# Patient Record
Sex: Male | Born: 1940 | Race: White | Hispanic: No | Marital: Married | Smoking: Former smoker
Health system: Southern US, Community
[De-identification: ages and names within clinical notes are randomized; demographics above are authoritative.]

## PROBLEM LIST (undated history)

## (undated) DIAGNOSIS — N400 Enlarged prostate without lower urinary tract symptoms: Secondary | ICD-10-CM

## (undated) DIAGNOSIS — R7303 Prediabetes: Secondary | ICD-10-CM

## (undated) DIAGNOSIS — I499 Cardiac arrhythmia, unspecified: Secondary | ICD-10-CM

## (undated) DIAGNOSIS — E785 Hyperlipidemia, unspecified: Secondary | ICD-10-CM

## (undated) DIAGNOSIS — C801 Malignant (primary) neoplasm, unspecified: Secondary | ICD-10-CM

## (undated) DIAGNOSIS — R0600 Dyspnea, unspecified: Secondary | ICD-10-CM

## (undated) DIAGNOSIS — I2510 Atherosclerotic heart disease of native coronary artery without angina pectoris: Secondary | ICD-10-CM

## (undated) DIAGNOSIS — M549 Dorsalgia, unspecified: Secondary | ICD-10-CM

## (undated) DIAGNOSIS — I739 Peripheral vascular disease, unspecified: Secondary | ICD-10-CM

## (undated) DIAGNOSIS — I495 Sick sinus syndrome: Secondary | ICD-10-CM

## (undated) DIAGNOSIS — I209 Angina pectoris, unspecified: Secondary | ICD-10-CM

## (undated) DIAGNOSIS — R600 Localized edema: Secondary | ICD-10-CM

## (undated) DIAGNOSIS — I10 Essential (primary) hypertension: Secondary | ICD-10-CM

## (undated) DIAGNOSIS — G8929 Other chronic pain: Secondary | ICD-10-CM

## (undated) DIAGNOSIS — J3089 Other allergic rhinitis: Secondary | ICD-10-CM

## (undated) HISTORY — PX: COLONOSCOPY WITH ESOPHAGOGASTRODUODENOSCOPY (EGD): SHX5779

## (undated) HISTORY — PX: OTHER SURGICAL HISTORY: SHX169

## (undated) HISTORY — PX: JOINT REPLACEMENT: SHX530

## (undated) HISTORY — PX: KNEE SURGERY: SHX244

## (undated) HISTORY — PX: COLONOSCOPY: SHX174

## (undated) HISTORY — PX: CORONARY ANGIOPLASTY WITH STENT PLACEMENT: SHX49

## (undated) HISTORY — PX: TONSILLECTOMY: SUR1361

---

## 2004-03-03 ENCOUNTER — Ambulatory Visit: Payer: Self-pay | Admitting: Gastroenterology

## 2004-05-31 ENCOUNTER — Other Ambulatory Visit: Payer: Self-pay

## 2004-06-29 ENCOUNTER — Inpatient Hospital Stay: Payer: Self-pay | Admitting: General Practice

## 2006-02-07 ENCOUNTER — Ambulatory Visit: Payer: Self-pay | Admitting: Gastroenterology

## 2007-07-25 ENCOUNTER — Ambulatory Visit: Payer: Self-pay

## 2007-08-21 ENCOUNTER — Ambulatory Visit: Payer: Self-pay | Admitting: Otolaryngology

## 2007-09-13 ENCOUNTER — Ambulatory Visit: Payer: Self-pay | Admitting: Otolaryngology

## 2007-12-28 ENCOUNTER — Ambulatory Visit: Payer: Self-pay | Admitting: Otolaryngology

## 2008-02-26 ENCOUNTER — Ambulatory Visit: Payer: Self-pay | Admitting: General Practice

## 2008-03-10 ENCOUNTER — Inpatient Hospital Stay: Payer: Self-pay | Admitting: General Practice

## 2009-01-09 ENCOUNTER — Ambulatory Visit: Payer: Self-pay | Admitting: Otolaryngology

## 2009-10-03 IMAGING — CT CT NECK WITH CONTRAST
1 of 3 series · 8 of 14 positions shown, 10 images · IV contrast (agent unspecified)
Comparison: none

REASON FOR EXAM: thyroid nodule
COMMENTS:

PROCEDURE:     JONAS - JONAS NECK WITH CONTRAST  - August 21, 2007  [DATE]
RESULT:
HISTORY: Thyroid nodule.

[Series 7: webspace · axial · 0.47mm/px · z∈[+157,+415]mm · 8 of 222 slices shown, 10 images]
[im 25/222  soft-tissue]
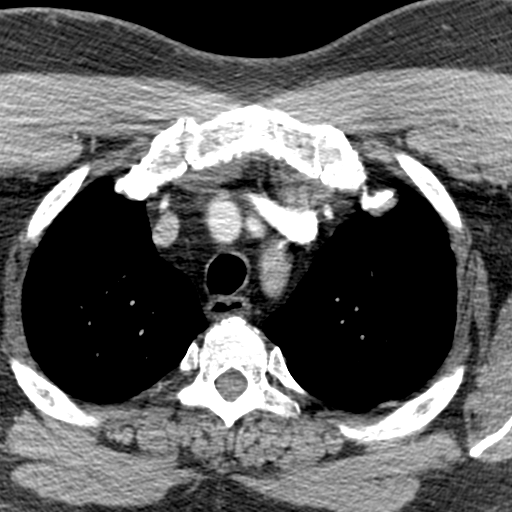
[im 25/222  bone]
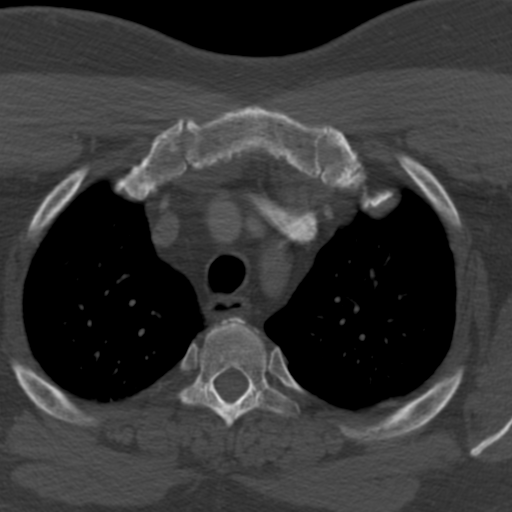
[im 50/222  bone]
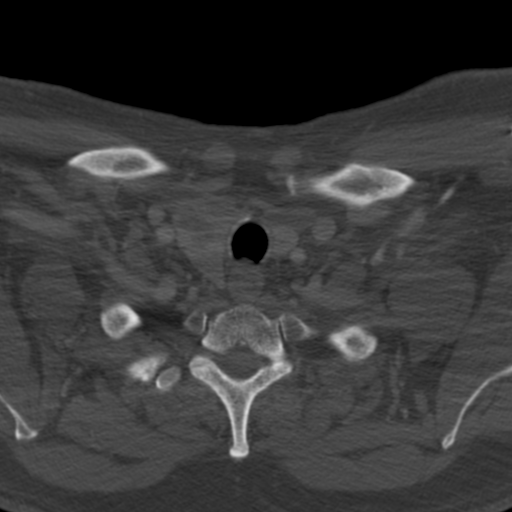
[im 74/222  bone]
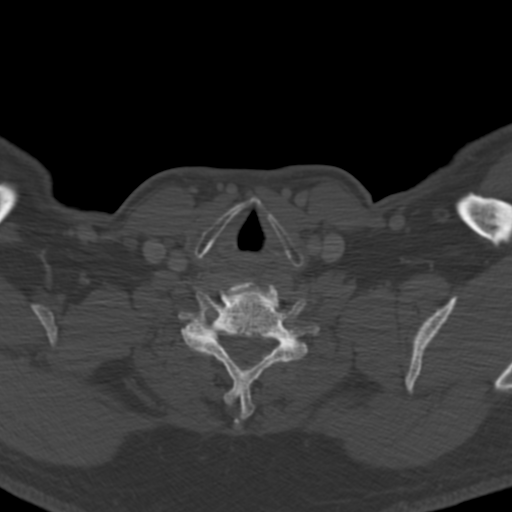
[im 99/222  bone]
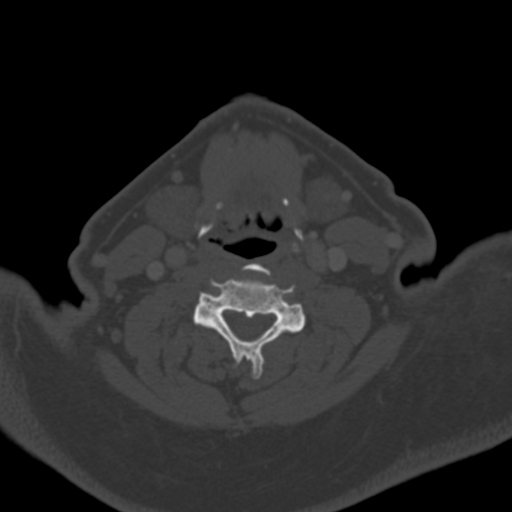
[im 123/222  soft-tissue]
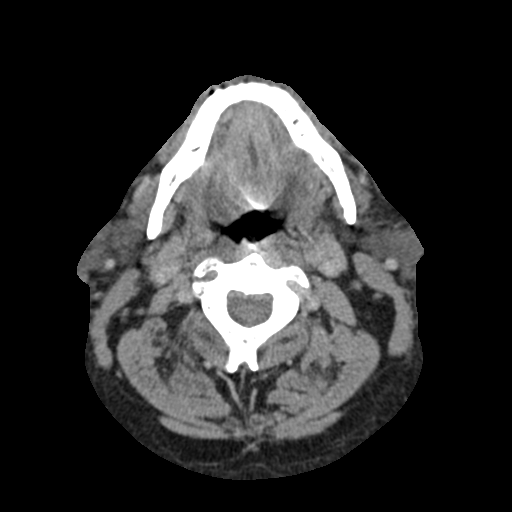
[im 123/222  bone]
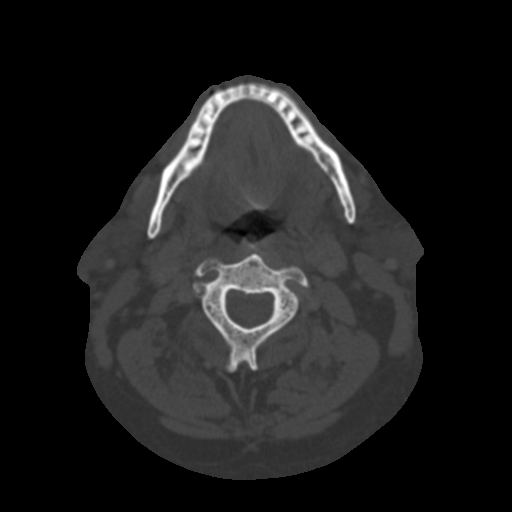
[im 148/222  bone]
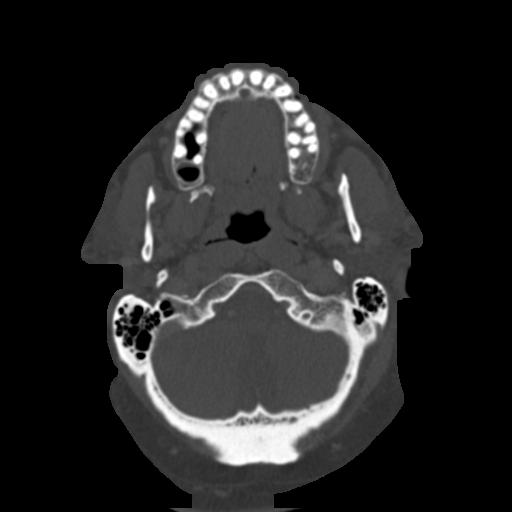
[im 172/222  bone]
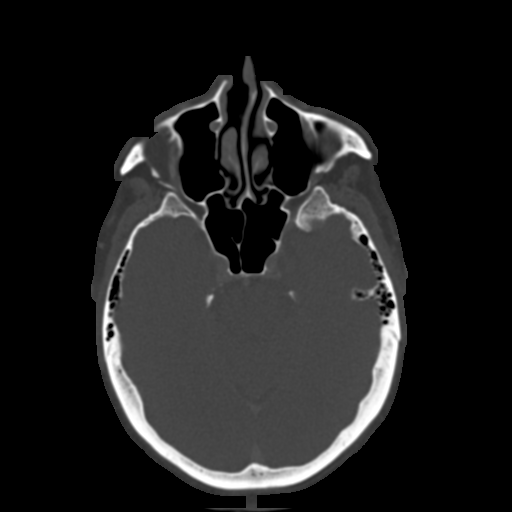
[im 197/222  bone]
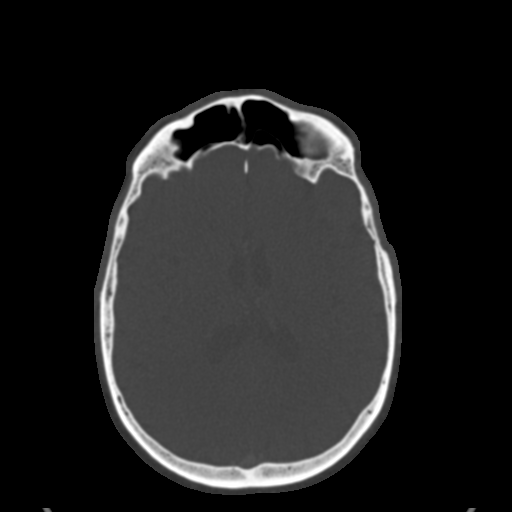

[8 of 14 positions shown; findings below may reference images not displayed]

COMPARISON STUDIES: None.

PROCEDURE AND FINDINGS:  IV contrast enhanced CT of the neck is obtained.
The paranasal sinuses are clear. The parotid gland and submandibular glands
are normal. The nasopharynx, oropharynx and larynx are normal. An
approximately 1.8 cm RIGHT thyroid nodule is present with diffuse
enlargement of the RIGHT thyroid gland. Thyroid ultrasound can be obtained
to determine if this is cystic or solid. If solid this lesion may be
amenable to biopsy. No pathologic cervical lymph nodes are noted using CT
size criteria.
IMPRESSION: 1.RIGHT thyroid gland enlargement with a prominent nodule for which thyroid
ultrasound is suggested. If this nodule is solid it may be amenable to
biopsy.

## 2010-03-15 ENCOUNTER — Ambulatory Visit: Payer: Self-pay | Admitting: Otolaryngology

## 2011-04-19 ENCOUNTER — Ambulatory Visit: Payer: Self-pay | Admitting: Otolaryngology

## 2011-05-18 ENCOUNTER — Ambulatory Visit: Payer: Self-pay | Admitting: Gastroenterology

## 2014-08-04 ENCOUNTER — Ambulatory Visit
Admit: 2014-08-04 | Disposition: A | Discharge status: Home / Self Care | Payer: Self-pay | Attending: Otolaryngology | Admitting: Otolaryngology

## 2015-10-23 ENCOUNTER — Other Ambulatory Visit: Payer: Self-pay | Admitting: Family Medicine

## 2015-10-23 DIAGNOSIS — Z87891 Personal history of nicotine dependence: Secondary | ICD-10-CM

## 2015-10-23 DIAGNOSIS — Z87898 Personal history of other specified conditions: Secondary | ICD-10-CM | POA: Insufficient documentation

## 2015-10-28 ENCOUNTER — Ambulatory Visit
Admission: RE | Admit: 2015-10-28 | Discharge: 2015-10-28 | Disposition: A | Discharge status: Home / Self Care | Payer: Medicare Other | Source: Ambulatory Visit | Attending: Family Medicine | Admitting: Family Medicine

## 2015-10-28 DIAGNOSIS — Z0000 Encounter for general adult medical examination without abnormal findings: Secondary | ICD-10-CM | POA: Diagnosis present

## 2015-10-28 DIAGNOSIS — Z87891 Personal history of nicotine dependence: Secondary | ICD-10-CM | POA: Insufficient documentation

## 2015-10-28 DIAGNOSIS — N281 Cyst of kidney, acquired: Secondary | ICD-10-CM | POA: Diagnosis not present

## 2015-11-02 ENCOUNTER — Telehealth: Payer: Self-pay | Admitting: *Deleted

## 2015-11-02 NOTE — Telephone Encounter (Signed)
Received referral for initial lung cancer screening scan. Contacted patient and obtained smoking history,(former, quit 14 years ago, July 2003, 80 pack year history) as well as answering questions related to screening process. Patient denies signs of lung cancer such as weight loss or hemoptysis. Patient denies comorbidity that would prevent curative treatment if lung cancer were found. Patient is tentatively scheduled for shared decision making visit and CT scan on 11/10/15 at 1:30pm, pending insurance approval from business office.

## 2015-11-02 NOTE — Telephone Encounter (Signed)
Received referral for low dose lung cancer screening CT scan. Voicemail left at phone number listed in EMR for patient to call me back to facilitate scheduling scan.  

## 2015-11-10 ENCOUNTER — Inpatient Hospital Stay: Payer: Medicare Other | Attending: Oncology | Admitting: Oncology

## 2015-11-10 ENCOUNTER — Other Ambulatory Visit: Payer: Self-pay | Admitting: *Deleted

## 2015-11-10 ENCOUNTER — Ambulatory Visit
Admission: RE | Admit: 2015-11-10 | Discharge: 2015-11-10 | Disposition: A | Discharge status: Home / Self Care | Payer: Medicare Other | Source: Ambulatory Visit | Attending: Oncology | Admitting: Oncology

## 2015-11-10 ENCOUNTER — Encounter: Payer: Self-pay | Admitting: Oncology

## 2015-11-10 DIAGNOSIS — Z87891 Personal history of nicotine dependence: Secondary | ICD-10-CM | POA: Diagnosis present

## 2015-11-10 DIAGNOSIS — K769 Liver disease, unspecified: Secondary | ICD-10-CM | POA: Insufficient documentation

## 2015-11-10 DIAGNOSIS — J439 Emphysema, unspecified: Secondary | ICD-10-CM | POA: Insufficient documentation

## 2015-11-10 DIAGNOSIS — Z122 Encounter for screening for malignant neoplasm of respiratory organs: Secondary | ICD-10-CM | POA: Diagnosis not present

## 2015-11-11 ENCOUNTER — Telehealth: Payer: Self-pay | Admitting: *Deleted

## 2015-11-11 NOTE — Telephone Encounter (Signed)
Notified patient of LDCT lung cancer screening results with recommendation for 12 month follow up imaging. Also notified of incidental finding noted below. Patient verbalizes understanding.   IMPRESSION: 1. Lung-RADS Category 2, benign appearance or behavior. Continue annual screening with low-dose chest CT without contrast in 12 months. 2. Emphysema. 3. 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

## 2015-11-28 DIAGNOSIS — Z87891 Personal history of nicotine dependence: Secondary | ICD-10-CM | POA: Insufficient documentation

## 2015-11-28 NOTE — Progress Notes (Addendum)
In accordance with CMS guidelines, patient has met eligibility criteria including age, absence of signs or symptoms of lung cancer.  Social History  Substance Use Topics  . Smoking status: Former Smoker    Packs/day: 2.00    Years: 40.00    Types: Cigarettes    Quit date: 10/09/2001  . Smokeless tobacco: Not on file  . Alcohol use Not on file     A shared decision-making session was conducted prior to the performance of CT scan. This includes one or more decision aids, includes benefits and harms of screening, follow-up diagnostic testing, over-diagnosis, false positive rate, and total radiation exposure.  Counseling on the importance of adherence to annual lung cancer LDCT screening, impact of co-morbidities, and ability or willingness to undergo diagnosis and treatment is imperative for compliance of the program.  Counseling on the importance of continued smoking cessation for former smokers; the importance of smoking cessation for current smokers, and information about tobacco cessation interventions have been given to patient including [place] and [employer] quit [place] programs.  Written order for lung cancer screening with LDCT has been given to the patient and any and all questions have been answered to the best of my abilities.   Yearly follow up will be coordinated by Burgess Estelle, Thoracic Navigator.

## 2016-08-29 DIAGNOSIS — Z860100 Personal history of colon polyps, unspecified: Secondary | ICD-10-CM | POA: Insufficient documentation

## 2016-11-10 ENCOUNTER — Telehealth: Payer: Self-pay | Admitting: *Deleted

## 2016-11-10 DIAGNOSIS — Z122 Encounter for screening for malignant neoplasm of respiratory organs: Secondary | ICD-10-CM

## 2016-11-10 NOTE — Telephone Encounter (Signed)
Notified patient that annual lung cancer screening low dose CT scan is due currently or will be in near future. Confirmed that patient is within the age range of 55-77, and asymptomatic, (no signs or symptoms of lung cancer). Patient denies illness that would prevent curative treatment for lung cancer if found. Verified smoking history, (former, quit 2003, 80 pack year). The shared decision making visit was done 11/10/15. Patient is agreeable for CT scan being scheduled.

## 2016-11-10 NOTE — Telephone Encounter (Signed)
Left message for patient to notify them that it is time to schedule annual low dose lung cancer screening CT scan. Instructed patient to call back to verify information prior to the scan being scheduled.  

## 2016-11-28 ENCOUNTER — Ambulatory Visit
Admission: RE | Admit: 2016-11-28 | Discharge: 2016-11-28 | Disposition: A | Discharge status: Home / Self Care | Payer: Medicare Other | Source: Ambulatory Visit | Attending: Oncology | Admitting: Oncology

## 2016-11-28 ENCOUNTER — Ambulatory Visit: Admission: RE | Admit: 2016-11-28 | Payer: Medicare Other | Source: Ambulatory Visit

## 2016-11-28 DIAGNOSIS — N281 Cyst of kidney, acquired: Secondary | ICD-10-CM | POA: Diagnosis not present

## 2016-11-28 DIAGNOSIS — Z122 Encounter for screening for malignant neoplasm of respiratory organs: Secondary | ICD-10-CM

## 2016-11-28 DIAGNOSIS — M47814 Spondylosis without myelopathy or radiculopathy, thoracic region: Secondary | ICD-10-CM | POA: Insufficient documentation

## 2016-11-28 DIAGNOSIS — I700 Atherosclerosis of aorta: Secondary | ICD-10-CM | POA: Insufficient documentation

## 2016-11-28 DIAGNOSIS — J8410 Pulmonary fibrosis, unspecified: Secondary | ICD-10-CM | POA: Insufficient documentation

## 2016-11-28 DIAGNOSIS — J439 Emphysema, unspecified: Secondary | ICD-10-CM | POA: Diagnosis not present

## 2016-11-28 DIAGNOSIS — Z87891 Personal history of nicotine dependence: Secondary | ICD-10-CM | POA: Insufficient documentation

## 2016-11-28 DIAGNOSIS — I2510 Atherosclerotic heart disease of native coronary artery without angina pectoris: Secondary | ICD-10-CM | POA: Insufficient documentation

## 2016-12-05 ENCOUNTER — Encounter: Payer: Self-pay | Admitting: *Deleted

## 2017-03-01 DIAGNOSIS — I499 Cardiac arrhythmia, unspecified: Secondary | ICD-10-CM | POA: Diagnosis not present

## 2017-03-02 ENCOUNTER — Emergency Department
Admission: EM | Admit: 2017-03-02 | Discharge: 2017-03-02 | Disposition: A | Discharge status: Home / Self Care | Payer: Medicare Other | Attending: Emergency Medicine | Admitting: Emergency Medicine

## 2017-03-02 ENCOUNTER — Other Ambulatory Visit: Payer: Self-pay

## 2017-03-02 ENCOUNTER — Emergency Department: Payer: Medicare Other

## 2017-03-02 ENCOUNTER — Encounter: Payer: Self-pay | Admitting: Emergency Medicine

## 2017-03-02 DIAGNOSIS — Z955 Presence of coronary angioplasty implant and graft: Secondary | ICD-10-CM | POA: Insufficient documentation

## 2017-03-02 DIAGNOSIS — G8929 Other chronic pain: Secondary | ICD-10-CM | POA: Diagnosis not present

## 2017-03-02 DIAGNOSIS — I10 Essential (primary) hypertension: Secondary | ICD-10-CM | POA: Diagnosis not present

## 2017-03-02 DIAGNOSIS — M545 Low back pain: Secondary | ICD-10-CM | POA: Insufficient documentation

## 2017-03-02 DIAGNOSIS — Z87891 Personal history of nicotine dependence: Secondary | ICD-10-CM | POA: Diagnosis not present

## 2017-03-02 HISTORY — DX: Essential (primary) hypertension: I10

## 2017-03-02 HISTORY — DX: Prediabetes: R73.03

## 2017-03-02 HISTORY — DX: Dorsalgia, unspecified: M54.9

## 2017-03-02 HISTORY — DX: Other chronic pain: G89.29

## 2017-03-02 MED ORDER — HYDROCODONE-ACETAMINOPHEN 5-325 MG PO TABS: 1.0000 | ORAL_TABLET | Freq: Four times a day (QID) | ORAL | 0 refills | Status: DC | PRN

## 2017-03-02 MED ORDER — HYDROCODONE-ACETAMINOPHEN 5-325 MG PO TABS
1.0000 | ORAL_TABLET | Freq: Once | ORAL | Status: AC
  Administered 2017-03-02: 1 via ORAL
  Filled 2017-03-02: qty 1

## 2017-03-02 NOTE — ED Triage Notes (Signed)
Pt c/o lower back pain. Has chronic back pain but then was traveling and car broke down and he had to sleep in cheap motel with bad mattress and pain has been worse. Pain minimal when sitting but horrible when standing.  No loss bowel or bladder. No radiating of pain

## 2017-03-02 NOTE — ED Notes (Addendum)
Patient states pain and numbness to back and right leg

## 2017-03-02 NOTE — Discharge Instructions (Addendum)
Follow-up with your primary care provider at your scheduled appointment. Take Norco as needed for pain. 1 tablet every 6 hours. Do not take extra Tylenol with this medication. May also use ice or heat to your back as needed for comfort.

## 2017-03-02 NOTE — ED Provider Notes (Signed)
[facility] Emergency Department Provider Note   ____________________________________________   First MD Initiated Contact with Patient 03/02/17 1140     (approximate)  I have reviewed the triage vital signs and the nursing notes.   HISTORY  Chief Complaint Back Pain   HPI Walter Knox is a 76 y.o. male is here complaining of low back pain. Patient states he has a history of chronic back pain but this is been exacerbated by traveling and "sleeping in a cheap motel".  He states that he had car problems and had no alternative. He has been taking his regular medication without any relief. Patient denies any urinary symptoms, incontinence of bowel or bladder, or radiation into his lower extremities. Patient states that pain is decreased with sitting however with standing for long periods of time or movement his pain is increased.    Past Medical History:  Diagnosis Date  . Chronic back pain   . Hypertension   . Pre-diabetes     Patient Active Problem List   Diagnosis Date Noted  . Personal history of tobacco use, presenting hazards to health 11/28/2015    Past Surgical History:  Procedure Laterality Date  . CORONARY ANGIOPLASTY WITH STENT PLACEMENT    . KNEE SURGERY    . TONSILLECTOMY      Prior to Admission medications   Medication Sig Start Date End Date Taking? Authorizing Provider  HYDROcodone-acetaminophen (NORCO/VICODIN) 5-325 MG tablet Take 1 tablet by mouth every 6 (six) hours as needed for moderate pain. 03/02/17   Johnn Hai, PA-C    Allergies Patient has no known allergies.  History reviewed. No pertinent family history.  Social History Social History   Tobacco Use  . Smoking status: Former Smoker    Packs/day: 2.00    Years: 40.00    Pack years: 80.00    Types: Cigarettes    Last attempt to quit: 10/09/2001    Years since quitting: 15.4  . Smokeless tobacco: Never Used  Substance Use Topics  . Alcohol use: Yes    . Drug use: No    Review of Systems Constitutional: No fever/chills Cardiovascular: Denies chest pain. Respiratory: Denies shortness of breath. Gastrointestinal: No abdominal pain.  No nausea, no vomiting.   Genitourinary: Negative for dysuria. Musculoskeletal: positive for acute and chronic back pain. Neurological: Negative for focal weakness or numbness. ____________________________________________   PHYSICAL EXAM:  VITAL SIGNS: ED Triage Vitals  Enc Vitals Group     BP 03/02/17 1125 (!) 144/82     Pulse Rate 03/02/17 1125 (!) 46     Resp 03/02/17 1125 18     Temp 03/02/17 1125 98.1 F (36.7 C)     Temp Source 03/02/17 1125 Oral     SpO2 03/02/17 1125 97 %     Weight 03/02/17 1125 (!) 313 lb (142 kg)     Height 03/02/17 1125 5\' 10"  (1.778 m)     Head Circumference --      Peak Flow --      Pain Score 03/02/17 1124 2     Pain Loc --      Pain Edu? --      Excl. in [place]? --     Constitutional: Alert and oriented. Well appearing and in no acute distress.  Morbidly obese. Eyes: Conjunctivae are normal.  Head: Atraumatic. Neck: No stridor.   Cardiovascular: Normal rate, regular rhythm. Grossly normal heart sounds.  Good peripheral circulation. Respiratory: Normal respiratory effort.  No retractions.  Lungs CTAB. Gastrointestinal: Soft and nontender. No distention. No CVA tenderness. Musculoskeletal: examination back there is no point tenderness on palpation of thoracic spine. There is marked tenderness noted lower lumbar spine and right paravertebral muscles and SI joint. The motion is guarded secondary to pain. Straight leg raises on the right sitting were negative on the left. Positive on the right. There is no point tenderness on palpation of the right thigh. Neurologic:  Normal speech and language. No gross focal neurologic deficits are appreciated.  Skin:  Skin is warm, dry and intact.  Psychiatric: Mood and affect are normal. Speech and behavior are  normal.  ____________________________________________   LABS (all labs ordered are listed, but only abnormal results are displayed)  Labs Reviewed - No data to display ____________________________________________  RADIOLOGY  Dg Lumbar Spine 2-3 Views  Result Date: 03/02/2017 CLINICAL DATA:  Lumbago EXAM: LUMBAR SPINE - 2-3 VIEW COMPARISON:  Lumbar MRI July 25, 2007 FINDINGS: Frontal, lateral, and spot lumbosacral lateral images were obtained. There are 5 non-rib-bearing lumbar type vertebral bodies. 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 There is no fracture. There is 4 mm of anterolisthesis of L4 on L5. No other spondylolisthesis. There is moderately severe disc space narrowing at L2-3, L3-4, and L4-5. Milder disc space narrowing is noted at all other levels. There are multiple anterior osteophytes at all levels. There is no erosive change. There is a 1.7 x 1.4 cm calcification in the right upper quadrant. There is aortic atherosclerosis. IMPRESSION: Multilevel arthropathy. Slight spondylolisthesis at L4-5 is felt to be due to underlying spondylosis. No evident fracture. Questionable laminated gallstone right upper quadrant. There is aortic atherosclerosis. Aortic Atherosclerosis (ICD10-I70.0). Electronically Signed   By: Lowella Grip III M.D.   On: 03/02/2017 13:22   Dg Hip Unilat W Or Wo Pelvis 2-3 Views Right  Result Date: 03/02/2017 CLINICAL DATA:  Pain EXAM: DG HIP (WITH OR WITHOUT PELVIS) 2-3V RIGHT COMPARISON:  None. FINDINGS: Frontal pelvis as well as frontal and lateral right hip images were obtained. There is no evident fracture or dislocation. There is mild symmetric narrowing of both hip joints. No erosive change. Sacroiliac joints appear normal bilaterally. There is degenerative change in the visualize lower lumbar spine. IMPRESSION: Slight symmetric narrowing of both hip joints. No fracture or dislocation. Sacroiliac joints appear unremarkable. Electronically Signed   By:  Lowella Grip III M.D.   On: 03/02/2017 13:23    ____________________________________________   PROCEDURES  Procedure(s) performed: None  Procedures  Critical Care performed: No  ____________________________________________   INITIAL IMPRESSION / ASSESSMENT AND PLAN / ED COURSE  As part of my medical decision making, I reviewed the following data within the electronic MEDICAL RECORD NUMBER Notes from prior ED visits and [place] Controlled Substance Database  prior to discharge patient states that the pain medication he received has helped a great deal with his pain. We will continue same medication and he is encouraged to keep his appointment with his PCP approximately 10 days from now. We discussed ice and he back as needed  for discomfort. X-ray findings was discussed with both him and his family. He is made aware that pain medication could increase drowsiness and increase his risk for falling. ____________________________________________   FINAL CLINICAL IMPRESSION(S) / ED DIAGNOSES  Final diagnoses:  Acute exacerbation of chronic low back pain     ED Discharge Orders        Ordered    HYDROcodone-acetaminophen (NORCO/VICODIN) 5-325 MG tablet  Every 6 hours PRN  03/02/17 1334       Note:  This document was prepared using Dragon voice recognition software and may include unintentional dictation errors.    Johnn Hai, PA-C 03/02/17 1523    Nena Polio, MD 03/09/17 2100

## 2017-03-15 DIAGNOSIS — Z0000 Encounter for general adult medical examination without abnormal findings: Secondary | ICD-10-CM | POA: Insufficient documentation

## 2017-03-30 ENCOUNTER — Encounter: Payer: Self-pay | Admitting: *Deleted

## 2017-03-31 ENCOUNTER — Ambulatory Visit: Payer: Medicare Other | Admitting: Anesthesiology

## 2017-03-31 ENCOUNTER — Encounter: Payer: Self-pay | Admitting: Anesthesiology

## 2017-03-31 ENCOUNTER — Ambulatory Visit
Admission: RE | Admit: 2017-03-31 | Discharge: 2017-03-31 | Disposition: A | Discharge status: Home / Self Care | Payer: Medicare Other | Source: Ambulatory Visit | Attending: Gastroenterology | Admitting: Gastroenterology

## 2017-03-31 ENCOUNTER — Encounter
Admission: RE | Disposition: A | Discharge status: Home / Self Care | Payer: Self-pay | Source: Ambulatory Visit | Attending: Gastroenterology

## 2017-03-31 DIAGNOSIS — Z87891 Personal history of nicotine dependence: Secondary | ICD-10-CM | POA: Diagnosis not present

## 2017-03-31 DIAGNOSIS — I10 Essential (primary) hypertension: Secondary | ICD-10-CM | POA: Diagnosis not present

## 2017-03-31 DIAGNOSIS — Z791 Long term (current) use of non-steroidal anti-inflammatories (NSAID): Secondary | ICD-10-CM | POA: Insufficient documentation

## 2017-03-31 DIAGNOSIS — I2510 Atherosclerotic heart disease of native coronary artery without angina pectoris: Secondary | ICD-10-CM | POA: Insufficient documentation

## 2017-03-31 DIAGNOSIS — Z1211 Encounter for screening for malignant neoplasm of colon: Secondary | ICD-10-CM | POA: Insufficient documentation

## 2017-03-31 DIAGNOSIS — E785 Hyperlipidemia, unspecified: Secondary | ICD-10-CM | POA: Diagnosis not present

## 2017-03-31 DIAGNOSIS — I739 Peripheral vascular disease, unspecified: Secondary | ICD-10-CM | POA: Diagnosis not present

## 2017-03-31 DIAGNOSIS — Z86010 Personal history of colonic polyps: Secondary | ICD-10-CM | POA: Insufficient documentation

## 2017-03-31 DIAGNOSIS — Z6841 Body Mass Index (BMI) 40.0 and over, adult: Secondary | ICD-10-CM | POA: Diagnosis not present

## 2017-03-31 DIAGNOSIS — Z7982 Long term (current) use of aspirin: Secondary | ICD-10-CM | POA: Diagnosis not present

## 2017-03-31 DIAGNOSIS — Z5309 Procedure and treatment not carried out because of other contraindication: Secondary | ICD-10-CM | POA: Insufficient documentation

## 2017-03-31 DIAGNOSIS — Z79899 Other long term (current) drug therapy: Secondary | ICD-10-CM | POA: Diagnosis not present

## 2017-03-31 HISTORY — PX: COLONOSCOPY WITH PROPOFOL: SHX5780

## 2017-03-31 HISTORY — DX: Atherosclerotic heart disease of native coronary artery without angina pectoris: I25.10

## 2017-03-31 HISTORY — DX: Hyperlipidemia, unspecified: E78.5

## 2017-03-31 HISTORY — DX: Benign prostatic hyperplasia without lower urinary tract symptoms: N40.0

## 2017-03-31 HISTORY — DX: Morbid (severe) obesity due to excess calories: E66.01

## 2017-03-31 HISTORY — DX: Peripheral vascular disease, unspecified: I73.9

## 2017-03-31 SURGERY — COLONOSCOPY WITH PROPOFOL
Anesthesia: General

## 2017-03-31 MED ORDER — FENTANYL CITRATE (PF) 100 MCG/2ML IJ SOLN
INTRAMUSCULAR | Status: AC
  Filled 2017-03-31: qty 2

## 2017-03-31 MED ORDER — LIDOCAINE HCL (PF) 1 % IJ SOLN
INTRAMUSCULAR | Status: AC
  Administered 2017-03-31: 0.3 mL via INTRADERMAL
  Filled 2017-03-31: qty 2

## 2017-03-31 MED ORDER — SODIUM CHLORIDE 0.9 % IV SOLN
INTRAVENOUS | Status: DC
  Administered 2017-03-31: 100 mL via INTRAVENOUS

## 2017-03-31 MED ORDER — PROPOFOL 500 MG/50ML IV EMUL
INTRAVENOUS | Status: AC
  Filled 2017-03-31: qty 50

## 2017-03-31 MED ORDER — LIDOCAINE HCL (PF) 1 % IJ SOLN
2.0000 mL | Freq: Once | INTRAMUSCULAR | Status: AC
  Administered 2017-03-31: 0.3 mL via INTRADERMAL

## 2017-03-31 MED ORDER — SODIUM CHLORIDE 0.9 % IV SOLN: INTRAVENOUS | Status: DC

## 2017-03-31 MED ORDER — MIDAZOLAM HCL 2 MG/2ML IJ SOLN
INTRAMUSCULAR | Status: AC
  Filled 2017-03-31: qty 2

## 2017-03-31 NOTE — Anesthesia Postprocedure Evaluation (Signed)
Anesthesia Post Note  Patient: Walter Knox  Procedure(s) Performed: COLONOSCOPY WITH PROPOFOL (N/A )  Level of consciousness: awake and alert and oriented Anesthetic complications: no Comments: No drugs were given and the case was canceled and cardiology called for wandering atrial pacemaker vs. Heart block.       Last Vitals:  Vitals:   03/31/17 0950 03/31/17 1000  BP: 121/67 133/75  Pulse: (!) 43 (!) 44  Resp: 14 15  Temp:    SpO2: 95% 94%    Last Pain:  Vitals:   03/31/17 0807  TempSrc: Tympanic                 Manon Banbury

## 2017-03-31 NOTE — Anesthesia Preprocedure Evaluation (Addendum)
Anesthesia Evaluation  Patient identified by MRN, date of birth, ID band Patient awake    Reviewed: Allergy & Precautions, NPO status , Patient's Chart, lab work & pertinent test results  Airway Mallampati: II  TM Distance: <3 FB     Dental  (+) Teeth Intact   Pulmonary former smoker,    Pulmonary exam normal        Cardiovascular hypertension, Pt. on medications + CAD and + Peripheral Vascular Disease  Normal cardiovascular exam     Neuro/Psych negative neurological ROS  negative psych ROS   GI/Hepatic Neg liver ROS,   Endo/Other  Morbid obesity  Renal/GU negative Renal ROS     Musculoskeletal negative musculoskeletal ROS (+)   Abdominal Normal abdominal exam  (+)   Peds  Hematology negative hematology ROS (+)   Anesthesia Other Findings   Reproductive/Obstetrics                            Anesthesia Physical Anesthesia Plan  ASA: III  Anesthesia Plan: General   Post-op Pain Management:    Induction: Intravenous  PONV Risk Score and Plan:   Airway Management Planned: Nasal Cannula  Additional Equipment:   Intra-op Plan:   Post-operative Plan:   Informed Consent: I have reviewed the patients History and Physical, chart, labs and discussed the procedure including the risks, benefits and alternatives for the proposed anesthesia with the patient or authorized representative who has indicated his/her understanding and acceptance.   Dental advisory given  Plan Discussed with: CRNA and Surgeon  Anesthesia Plan Comments:         Anesthesia Quick Evaluation

## 2017-03-31 NOTE — Anesthesia Post-op Follow-up Note (Signed)
Anesthesia QCDR form completed.        

## 2017-03-31 NOTE — H&P (Signed)
Outpatient short stay form Pre-procedure 03/31/2017 8:10 AM Lollie Sails MD  Primary Physician: Dr Dion Body  Reason for visit:  Colonoscopy  History of present illness:  Patient is a 76 year old male presenting today as above. He has personal history of adenomatous colon polyps. He tolerated his prep well. He does take 81 mg aspirin last time several days ago. He was on a 325 mg aspirin several weeks ago but this is been changed to 81. He takes no other aspirin products or blood thinning agents.    Current Facility-Administered Medications:  .  0.9 %  sodium chloride infusion, , Intravenous, Continuous, Lollie Sails, MD .  0.9 %  sodium chloride infusion, , Intravenous, Continuous, Lollie Sails, MD .  lidocaine (PF) (XYLOCAINE) 1 % injection 2 mL, 2 mL, Intradermal, Once, Lollie Sails, MD  Medications Prior to Admission  Medication Sig Dispense Refill Last Dose  . acetaminophen (TYLENOL) 650 MG CR tablet Take 650 mg by mouth 2 (two) times daily.     Marland Kitchen amLODipine (NORVASC) 10 MG tablet Take 10 mg by mouth daily.   03/31/2017 at 0600  . aspirin 325 MG tablet Take 81 mg by mouth daily.    03/29/2017 at 2300  . aspirin EC 81 MG tablet Take 81 mg by mouth daily.   03/29/2017 at 2300  . celecoxib (CELEBREX) 200 MG capsule Take 200 mg by mouth 2 (two) times daily.   03/28/2017 at 2300  . cetirizine (ZYRTEC) 10 MG chewable tablet Chew 10 mg by mouth daily.     . hydrochlorothiazide (HYDRODIURIL) 25 MG tablet Take 25 mg by mouth daily.   03/31/2017 at 0600  . loratadine (CLARITIN) 10 MG tablet Take 10 mg by mouth daily.     Marland Kitchen losartan (COZAAR) 100 MG tablet Take 100 mg by mouth daily.   03/31/2017 at 0600  . Multiple Vitamin (MULTIVITAMIN) tablet Take 1 tablet by mouth daily.     . niacin (NIASPAN) 1000 MG CR tablet Take 1,000 mg by mouth at bedtime.     . polycarbophil (FIBERCON) 625 MG tablet Take 625 mg by mouth 2 (two) times daily.     . simvastatin (ZOCOR)  40 MG tablet Take 40 mg by mouth daily.     Marland Kitchen HYDROcodone-acetaminophen (NORCO/VICODIN) 5-325 MG tablet Take 1 tablet by mouth every 6 (six) hours as needed for moderate pain. 20 tablet 0      No Known Allergies   Past Medical History:  Diagnosis Date  . BPH (benign prostatic hyperplasia)   . CAD (coronary artery disease)   . Chronic back pain   . Hyperlipidemia   . Hypertension   . Morbid obesity ([place])   . Pre-diabetes   . PVD (peripheral vascular disease) ([place])     Review of systems:      Physical Exam    Heart and lungs: Regular rate and rhythm without rub or gallop lungs are bilaterally clear. Bradycardia.    HEENT: Normocephalic atraumatic eyes are anicteric    Other:     Pertinant exam for procedure: Protuberant soft nontender nondistended bowel sounds positive normoactive    Planned proceedures: Colonoscopy and indicated procedures. I have discussed the risks benefits and complications of procedures to include not limited to bleeding, infection, perforation and the risk of sedation and the patient wishes to proceed.    Lollie Sails, MD Gastroenterology 03/31/2017  8:10 AM

## 2017-03-31 NOTE — Transfer of Care (Signed)
Immediate Anesthesia Transfer of Care Note  Patient: Walter Knox  Procedure(s) Performed: COLONOSCOPY WITH PROPOFOL (N/A )  Patient Location: PACU  Anesthesia Type:No anesthesia Airway & Oxygen Therapy: Patient Spontanous Breathing  Post-op Assessment: Report given to RN  Post vital signs: Reviewed  Last Vitals:  Vitals:   03/31/17 0807  BP: (!) 159/71  Pulse: (!) 45  Resp: 16  Temp: (!) 36.1 C  SpO2: 94%    Last Pain:  Vitals:   03/31/17 0807  TempSrc: Tympanic         Complications: No apparent anesthesia complications

## 2017-04-05 ENCOUNTER — Other Ambulatory Visit: Payer: Self-pay | Admitting: Nurse Practitioner

## 2017-04-05 ENCOUNTER — Encounter: Payer: Self-pay | Admitting: Gastroenterology

## 2017-04-05 ENCOUNTER — Other Ambulatory Visit: Payer: Self-pay | Admitting: Internal Medicine

## 2017-04-05 DIAGNOSIS — F458 Other somatoform disorders: Secondary | ICD-10-CM

## 2017-04-05 DIAGNOSIS — Z8679 Personal history of other diseases of the circulatory system: Secondary | ICD-10-CM

## 2017-04-05 DIAGNOSIS — R0600 Dyspnea, unspecified: Secondary | ICD-10-CM

## 2017-04-13 ENCOUNTER — Ambulatory Visit
Admission: RE | Admit: 2017-04-13 | Discharge: 2017-04-13 | Disposition: A | Discharge status: Home / Self Care | Payer: Medicare Other | Source: Ambulatory Visit | Attending: Nurse Practitioner | Admitting: Nurse Practitioner

## 2017-04-13 DIAGNOSIS — Z8679 Personal history of other diseases of the circulatory system: Secondary | ICD-10-CM | POA: Insufficient documentation

## 2017-04-13 DIAGNOSIS — I441 Atrioventricular block, second degree: Secondary | ICD-10-CM | POA: Insufficient documentation

## 2017-04-13 DIAGNOSIS — I340 Nonrheumatic mitral (valve) insufficiency: Secondary | ICD-10-CM | POA: Insufficient documentation

## 2017-04-13 DIAGNOSIS — F458 Other somatoform disorders: Secondary | ICD-10-CM | POA: Diagnosis present

## 2017-04-13 MED ORDER — REGADENOSON 0.4 MG/5ML IV SOLN
0.4000 mg | Freq: Once | INTRAVENOUS | Status: AC
  Administered 2017-04-13: 0.4 mg via INTRAVENOUS

## 2017-04-13 MED ORDER — TECHNETIUM TC 99M TETROFOSMIN IV KIT
30.0000 | PACK | Freq: Once | INTRAVENOUS | Status: AC | PRN
  Administered 2017-04-13: 32.9 via INTRAVENOUS

## 2017-04-13 MED ORDER — TECHNETIUM TC 99M TETROFOSMIN IV KIT: 30.0000 | PACK | Freq: Once | INTRAVENOUS | Status: DC | PRN

## 2017-04-13 NOTE — Progress Notes (Signed)
*  PRELIMINARY RESULTS* Echocardiogram 2D Echocardiogram has been performed.  Sherrie Sport 04/13/2017, 11:34 AM

## 2017-04-14 ENCOUNTER — Ambulatory Visit
Admission: RE | Admit: 2017-04-14 | Discharge: 2017-04-14 | Disposition: A | Discharge status: Home / Self Care | Payer: Medicare Other | Source: Ambulatory Visit | Attending: Nurse Practitioner | Admitting: Nurse Practitioner

## 2017-04-14 LAB — NM MYOCAR MULTI W/SPECT W/WALL MOTION / EF
CHL CUP RESTING HR STRESS: 45 {beats}/min
CSEPED: 1 min
CSEPHR: 42 %
Estimated workload: 1 METS
Exercise duration (sec): 0 s
LVDIAVOL: 187 mL (ref 62–150)
LVSYSVOL: 65 mL
MPHR: 143 {beats}/min
Peak HR: 61 {beats}/min
SDS: 0
SRS: 0
SSS: 4
TID: 0.93

## 2017-04-14 MED ORDER — TECHNETIUM TC 99M TETROFOSMIN IV KIT
31.9000 | PACK | Freq: Once | INTRAVENOUS | Status: AC | PRN
  Administered 2017-04-14: 31.9 via INTRAVENOUS

## 2017-04-17 NOTE — Consult Note (Signed)
Patietn presented for colonoscopy. He was noted to have a regular pulse and 12-lead EKG indicated possible dysrhythmia. He was hemodynamically stable and released to follow up with his primary care physician. No procedure done.

## 2017-04-25 ENCOUNTER — Encounter
Admission: RE | Admit: 2017-04-25 | Discharge: 2017-04-25 | Disposition: A | Discharge status: Home / Self Care | Payer: Medicare Other | Source: Ambulatory Visit | Attending: Cardiology | Admitting: Cardiology

## 2017-04-25 ENCOUNTER — Other Ambulatory Visit: Payer: Self-pay

## 2017-04-25 ENCOUNTER — Ambulatory Visit
Admission: RE | Admit: 2017-04-25 | Discharge: 2017-04-25 | Disposition: A | Discharge status: Home / Self Care | Payer: Medicare Other | Source: Ambulatory Visit | Attending: Cardiology | Admitting: Cardiology

## 2017-04-25 DIAGNOSIS — I4589 Other specified conduction disorders: Secondary | ICD-10-CM | POA: Diagnosis not present

## 2017-04-25 DIAGNOSIS — Z01818 Encounter for other preprocedural examination: Secondary | ICD-10-CM

## 2017-04-25 DIAGNOSIS — Z01812 Encounter for preprocedural laboratory examination: Secondary | ICD-10-CM | POA: Diagnosis not present

## 2017-04-25 DIAGNOSIS — I495 Sick sinus syndrome: Secondary | ICD-10-CM | POA: Insufficient documentation

## 2017-04-25 HISTORY — DX: Angina pectoris, unspecified: I20.9

## 2017-04-25 HISTORY — DX: Other allergic rhinitis: J30.89

## 2017-04-25 HISTORY — DX: Localized edema: R60.0

## 2017-04-25 HISTORY — DX: Cardiac arrhythmia, unspecified: I49.9

## 2017-04-25 HISTORY — DX: Dyspnea, unspecified: R06.00

## 2017-04-25 LAB — BASIC METABOLIC PANEL
Anion gap: 9 (ref 5–15)
BUN: 19 mg/dL (ref 6–20)
CO2: 26 mmol/L (ref 22–32)
Calcium: 9.1 mg/dL (ref 8.9–10.3)
Chloride: 105 mmol/L (ref 101–111)
Creatinine, Ser: 1 mg/dL (ref 0.61–1.24)
Glucose, Bld: 143 mg/dL — ABNORMAL HIGH (ref 65–99)
POTASSIUM: 4.1 mmol/L (ref 3.5–5.1)
SODIUM: 140 mmol/L (ref 135–145)

## 2017-04-25 LAB — PROTIME-INR
INR: 1.13
PROTHROMBIN TIME: 14.4 s (ref 11.4–15.2)

## 2017-04-25 LAB — CBC
HCT: 46.5 % (ref 40.0–52.0)
HEMOGLOBIN: 15.8 g/dL (ref 13.0–18.0)
MCH: 30.1 pg (ref 26.0–34.0)
MCHC: 33.9 g/dL (ref 32.0–36.0)
MCV: 88.9 fL (ref 80.0–100.0)
PLATELETS: 204 10*3/uL (ref 150–440)
RBC: 5.23 MIL/uL (ref 4.40–5.90)
RDW: 14.1 % (ref 11.5–14.5)
WBC: 8.3 10*3/uL (ref 3.8–10.6)

## 2017-04-25 LAB — SURGICAL PCR SCREEN
MRSA, PCR: NEGATIVE
Staphylococcus aureus: NEGATIVE

## 2017-04-25 LAB — APTT: aPTT: 28 seconds (ref 24–36)

## 2017-04-25 NOTE — Patient Instructions (Signed)
Your procedure is scheduled on: 05/03/17 Wed Report to Same Day Surgery 2nd floor medical mall [facility] Entrance-take elevator on left to 2nd floor.  Check in with surgery information desk.) To find out your arrival time please call [phone] between 1PM - 3PM on 05/02/17 Tues  Remember: Instructions that are not followed completely may result in serious medical risk, up to and including death, or upon the discretion of your surgeon and anesthesiologist your surgery may need to be rescheduled.    _x___ 1. Do not eat food after midnight the night before your procedure. You may drink clear liquids up to 2 hours before you are scheduled to arrive at the hospital for your procedure.  Do not drink clear liquids within 2 hours of your scheduled arrival to the hospital.  Clear liquids include  --Water or Apple juice without pulp  --Clear carbohydrate beverage such as ClearFast or Gatorade  --Black Coffee or Clear Tea (No milk, no creamers, do not add anything to                  the coffee or Tea Type 1 and type 2 diabetics should only drink water.  No gum chewing or hard candies.     __x__ 2. No Alcohol for 24 hours before or after surgery.   __x__3. No Smoking for 24 prior to surgery.   ____  4. Bring all medications with you on the day of surgery if instructed.    __x__ 5. Notify your doctor if there is any change in your medical condition     (cold, fever, infections).     Do not wear jewelry, make-up, hairpins, clips or nail polish.  Do not wear lotions, powders, or perfumes. You may wear deodorant.  Do not shave 48 hours prior to surgery. Men may shave face and neck.  Do not bring valuables to the hospital.    [facility] is not responsible for any belongings or valuables.               Contacts, dentures or bridgework may not be worn into surgery.  Leave your suitcase in the car. After surgery it may be brought to your room.  For patients admitted to the hospital, discharge  time is determined by your                       treatment team.   Patients discharged the day of surgery will not be allowed to drive home.  You will need someone to drive you home and stay with you the night of your procedure.    Please read over the following fact sheets that you were given:   [facility] Preparing for Surgery and or MRSA Information   _x___ Take anti-hypertensive listed below, cardiac, seizure, asthma,     anti-reflux and psychiatric medicines. These include:  1. amLODipine (NORVASC) 10 MG tablet  2.omeprazole (PRILOSEC) 20 MG capsule  3.  4.  5.  6.  ____Fleets enema or Magnesium Citrate as directed.   _x___ Use CHG Soap or sage wipes as directed on instruction sheet   ____ Use inhalers on the day of surgery and bring to hospital day of surgery  ____ Stop Metformin and Janumet 2 days prior to surgery.    ____ Take 1/2 of usual insulin dose the night before surgery and none on the morning     surgery.   _x___ Follow recommendations from Cardiologist, Pulmonologist or PCP regarding  stopping Aspirin, Coumadin, Plavix ,Eliquis, Effient, or Pradaxa, and Pletal. Stop Aspirin per instruction of physician.  X____Stop Anti-inflammatories such as Advil, Aleve, Ibuprofen, Motrin, Naproxen, Naprosyn, Goodies powders or aspirin products. OK to take Tylenol and                          Celebrex.  Stop fish oil 1 week before surgery   _x___ Stop supplements until after surgery.  But may continue Vitamin D, Vitamin B,       and multivitamin.   ____ Bring C-Pap to the hospital.

## 2017-04-25 NOTE — Pre-Procedure Instructions (Signed)
Discussed EKG with anesthesia nurse, Sherrie, instructed to send EKG to cardiology.  EKG faxed to Dr Nehemiah Massed.

## 2017-05-02 MED ORDER — CEFAZOLIN SODIUM-DEXTROSE 1-4 GM/50ML-% IV SOLN
1.0000 g | Freq: Once | INTRAVENOUS | Status: AC
  Administered 2017-05-03: 2 g via INTRAVENOUS

## 2017-05-03 ENCOUNTER — Other Ambulatory Visit: Payer: Self-pay

## 2017-05-03 ENCOUNTER — Encounter: Payer: Self-pay | Admitting: *Deleted

## 2017-05-03 ENCOUNTER — Encounter
Admission: RE | Disposition: A | Discharge status: Home / Self Care | Payer: Self-pay | Source: Ambulatory Visit | Attending: Cardiology

## 2017-05-03 ENCOUNTER — Ambulatory Visit: Payer: Medicare Other | Admitting: Anesthesiology

## 2017-05-03 ENCOUNTER — Ambulatory Visit: Payer: Medicare Other

## 2017-05-03 ENCOUNTER — Observation Stay
Admission: RE | Admit: 2017-05-03 | Discharge: 2017-05-04 | Disposition: A | Discharge status: Home / Self Care | Payer: Medicare Other | Source: Ambulatory Visit | Attending: Cardiology | Admitting: Cardiology

## 2017-05-03 ENCOUNTER — Observation Stay: Payer: Medicare Other

## 2017-05-03 DIAGNOSIS — I739 Peripheral vascular disease, unspecified: Secondary | ICD-10-CM | POA: Diagnosis not present

## 2017-05-03 DIAGNOSIS — Z7982 Long term (current) use of aspirin: Secondary | ICD-10-CM | POA: Insufficient documentation

## 2017-05-03 DIAGNOSIS — Z87891 Personal history of nicotine dependence: Secondary | ICD-10-CM | POA: Insufficient documentation

## 2017-05-03 DIAGNOSIS — I10 Essential (primary) hypertension: Secondary | ICD-10-CM | POA: Diagnosis not present

## 2017-05-03 DIAGNOSIS — Z6841 Body Mass Index (BMI) 40.0 and over, adult: Secondary | ICD-10-CM | POA: Diagnosis not present

## 2017-05-03 DIAGNOSIS — I2510 Atherosclerotic heart disease of native coronary artery without angina pectoris: Secondary | ICD-10-CM | POA: Diagnosis not present

## 2017-05-03 DIAGNOSIS — I495 Sick sinus syndrome: Secondary | ICD-10-CM | POA: Diagnosis not present

## 2017-05-03 DIAGNOSIS — Z79899 Other long term (current) drug therapy: Secondary | ICD-10-CM | POA: Diagnosis not present

## 2017-05-03 DIAGNOSIS — Z950 Presence of cardiac pacemaker: Secondary | ICD-10-CM

## 2017-05-03 DIAGNOSIS — E785 Hyperlipidemia, unspecified: Secondary | ICD-10-CM | POA: Insufficient documentation

## 2017-05-03 DIAGNOSIS — I442 Atrioventricular block, complete: Secondary | ICD-10-CM | POA: Insufficient documentation

## 2017-05-03 DIAGNOSIS — Z791 Long term (current) use of non-steroidal anti-inflammatories (NSAID): Secondary | ICD-10-CM | POA: Insufficient documentation

## 2017-05-03 DIAGNOSIS — Z96653 Presence of artificial knee joint, bilateral: Secondary | ICD-10-CM | POA: Diagnosis not present

## 2017-05-03 HISTORY — PX: PACEMAKER INSERTION: SHX728

## 2017-05-03 LAB — GLUCOSE, CAPILLARY: GLUCOSE-CAPILLARY: 143 mg/dL — AB (ref 65–99)

## 2017-05-03 SURGERY — INSERTION, CARDIAC PACEMAKER
Anesthesia: Monitor Anesthesia Care | Wound class: Clean

## 2017-05-03 MED ORDER — PROPOFOL 500 MG/50ML IV EMUL
INTRAVENOUS | Status: DC | PRN
  Administered 2017-05-03: 25 ug/kg/min via INTRAVENOUS

## 2017-05-03 MED ORDER — FENTANYL CITRATE (PF) 100 MCG/2ML IJ SOLN: 25.0000 ug | INTRAMUSCULAR | Status: DC | PRN

## 2017-05-03 MED ORDER — ONDANSETRON HCL 4 MG/2ML IJ SOLN: 4.0000 mg | Freq: Four times a day (QID) | INTRAMUSCULAR | Status: DC | PRN

## 2017-05-03 MED ORDER — ACETAMINOPHEN 325 MG PO TABS: 325.0000 mg | ORAL_TABLET | ORAL | Status: DC | PRN

## 2017-05-03 MED ORDER — DEXTROSE 5 % IV SOLN
1.0000 g | Freq: Four times a day (QID) | INTRAVENOUS | Status: AC
  Administered 2017-05-03 – 2017-05-04 (×3): 1 g via INTRAVENOUS
  Filled 2017-05-03 (×3): qty 10

## 2017-05-03 MED ORDER — PROPOFOL 500 MG/50ML IV EMUL
INTRAVENOUS | Status: AC
  Filled 2017-05-03: qty 50

## 2017-05-03 MED ORDER — LOSARTAN POTASSIUM 50 MG PO TABS
100.0000 mg | ORAL_TABLET | Freq: Every day | ORAL | Status: DC
  Administered 2017-05-03 – 2017-05-04 (×2): 100 mg via ORAL
  Filled 2017-05-03 (×2): qty 2

## 2017-05-03 MED ORDER — HYDROCHLOROTHIAZIDE 25 MG PO TABS
25.0000 mg | ORAL_TABLET | Freq: Every day | ORAL | Status: DC
  Administered 2017-05-03 – 2017-05-04 (×2): 25 mg via ORAL
  Filled 2017-05-03 (×2): qty 1

## 2017-05-03 MED ORDER — ONDANSETRON HCL 4 MG/2ML IJ SOLN: 4.0000 mg | Freq: Once | INTRAMUSCULAR | Status: DC | PRN

## 2017-05-03 MED ORDER — SIMVASTATIN 20 MG PO TABS
40.0000 mg | ORAL_TABLET | Freq: Every day | ORAL | Status: DC
  Administered 2017-05-03: 40 mg via ORAL
  Filled 2017-05-03: qty 2

## 2017-05-03 MED ORDER — GENTAMICIN SULFATE 40 MG/ML IJ SOLN
INTRAMUSCULAR | Status: AC
  Filled 2017-05-03: qty 2

## 2017-05-03 MED ORDER — AMLODIPINE BESYLATE 10 MG PO TABS
10.0000 mg | ORAL_TABLET | Freq: Every day | ORAL | Status: DC
  Administered 2017-05-03 – 2017-05-04 (×2): 10 mg via ORAL
  Filled 2017-05-03 (×2): qty 1

## 2017-05-03 MED ORDER — LIDOCAINE 1 % OPTIME INJ - NO CHARGE
INTRAMUSCULAR | Status: DC | PRN
  Administered 2017-05-03: 30 mL

## 2017-05-03 MED ORDER — GENTAMICIN SULFATE 40 MG/ML IJ SOLN
INTRAMUSCULAR | Status: DC | PRN
  Administered 2017-05-03: 300 mL

## 2017-05-03 MED ORDER — SODIUM CHLORIDE 0.9 % IR SOLN
Freq: Once | Status: DC
  Filled 2017-05-03: qty 2

## 2017-05-03 MED ORDER — LACTATED RINGERS IV SOLN
INTRAVENOUS | Status: DC
  Administered 2017-05-03 (×3): via INTRAVENOUS

## 2017-05-03 MED ORDER — PROPOFOL 10 MG/ML IV BOLUS
INTRAVENOUS | Status: AC
  Filled 2017-05-03: qty 20

## 2017-05-03 MED ORDER — IOPAMIDOL (ISOVUE-300) INJECTION 61%
INTRAVENOUS | Status: DC | PRN
  Administered 2017-05-03: 20 mL via INTRAVENOUS

## 2017-05-03 MED ORDER — EPHEDRINE SULFATE 50 MG/ML IJ SOLN
INTRAMUSCULAR | Status: DC | PRN
  Administered 2017-05-03 (×2): 10 mg via INTRAVENOUS

## 2017-05-03 MED ORDER — FENTANYL CITRATE (PF) 100 MCG/2ML IJ SOLN
INTRAMUSCULAR | Status: DC | PRN
  Administered 2017-05-03: 50 ug via INTRAVENOUS
  Administered 2017-05-03 (×2): 25 ug via INTRAVENOUS

## 2017-05-03 MED ORDER — PROPOFOL 10 MG/ML IV BOLUS
INTRAVENOUS | Status: DC | PRN
  Administered 2017-05-03 (×2): 10 mg via INTRAVENOUS
  Administered 2017-05-03 (×2): 20 mg via INTRAVENOUS

## 2017-05-03 MED ORDER — FENTANYL CITRATE (PF) 100 MCG/2ML IJ SOLN
INTRAMUSCULAR | Status: AC
  Filled 2017-05-03: qty 2

## 2017-05-03 SURGICAL SUPPLY — 37 items
BAG DECANTER FOR FLEXI CONT (MISCELLANEOUS) ×3 IMPLANT
BRUSH SCRUB EZ  4% CHG (MISCELLANEOUS) ×2
BRUSH SCRUB EZ 4% CHG (MISCELLANEOUS) ×1 IMPLANT
CABLE SURG 12 DISP A/V CHANNEL (MISCELLANEOUS) ×3 IMPLANT
CANISTER SUCT 1200ML W/VALVE (MISCELLANEOUS) ×3 IMPLANT
CHLORAPREP W/TINT 26ML (MISCELLANEOUS) ×3 IMPLANT
COVER LIGHT HANDLE STERIS (MISCELLANEOUS) ×6 IMPLANT
COVER MAYO STAND STRL (DRAPES) ×3 IMPLANT
DRAPE C-ARM XRAY 36X54 (DRAPES) ×3 IMPLANT
DRSG TEGADERM 4X4.75 (GAUZE/BANDAGES/DRESSINGS) ×3 IMPLANT
DRSG TELFA 4X3 1S NADH ST (GAUZE/BANDAGES/DRESSINGS) ×3 IMPLANT
ELECT REM PT RETURN 9FT ADLT (ELECTROSURGICAL) ×3
ELECTRODE REM PT RTRN 9FT ADLT (ELECTROSURGICAL) ×1 IMPLANT
GLOVE BIO SURGEON STRL SZ7.5 (GLOVE) ×3 IMPLANT
GLOVE BIO SURGEON STRL SZ8 (GLOVE) ×3 IMPLANT
GOWN STRL REUS W/ TWL LRG LVL3 (GOWN DISPOSABLE) ×1 IMPLANT
GOWN STRL REUS W/ TWL XL LVL3 (GOWN DISPOSABLE) ×1 IMPLANT
GOWN STRL REUS W/TWL LRG LVL3 (GOWN DISPOSABLE) ×2
GOWN STRL REUS W/TWL XL LVL3 (GOWN DISPOSABLE) ×2
IMMOBILIZER SHDR MD LX WHT (SOFTGOODS) IMPLANT
IMMOBILIZER SHDR XL LX WHT (SOFTGOODS) IMPLANT
INTRO PACEMAKR LEAD 9FR 13CM (INTRODUCER) ×3
INTRO PACEMKR SHEATH II 7FR (MISCELLANEOUS) ×3
INTRODUCER PACEMKR LD 9FR 13CM (INTRODUCER) ×1 IMPLANT
INTRODUCER PACEMKR SHTH II 7FR (MISCELLANEOUS) ×1 IMPLANT
IPG PACE AZUR XT DR MRI W1DR01 (Pacemaker) ×1 IMPLANT
IV NS 500ML (IV SOLUTION) ×2
IV NS 500ML BAXH (IV SOLUTION) ×1 IMPLANT
KIT RM TURNOVER STRD PROC AR (KITS) ×3 IMPLANT
LABEL OR SOLS (LABEL) ×3 IMPLANT
LEAD CAPSURE NOVUS 5076-52CM (Lead) ×3 IMPLANT
LEAD CAPSURE NOVUS 5076-58CM (Lead) ×3 IMPLANT
MARKER SKIN DUAL TIP RULER LAB (MISCELLANEOUS) ×3 IMPLANT
PACE AZURE XT DR MRI W1DR01 (Pacemaker) ×3 IMPLANT
PACK PACE INSERTION (MISCELLANEOUS) ×3 IMPLANT
PAD ONESTEP ZOLL R SERIES ADT (MISCELLANEOUS) ×3 IMPLANT
SUT SILK 0 SH 30 (SUTURE) ×9 IMPLANT

## 2017-05-03 NOTE — Interval H&P Note (Signed)
History and Physical Interval Note:  05/03/2017 12:06 PM  Walter Knox  has presented today for surgery, with the diagnosis of SSS  The various methods of treatment have been discussed with the patient and family. After consideration of risks, benefits and other options for treatment, the patient has consented to  Procedure(s): INSERTION PACEMAKER-DUAL CHAMBER INITIAL INSERT (N/A) as a surgical intervention .  The patient's history has been reviewed, patient examined, no change in status, stable for surgery.  I have reviewed the patient's chart and labs.  Questions were answered to the patient's satisfaction.     Michaelah Credeur [employer]

## 2017-05-03 NOTE — H&P (Signed)
Jump to Section ? Document InformationEncounter DetailsGoalsImaging ResultsLast Filed Vital SignsPatient DemographicsPlan of TreatmentProceduresProgress NotesReason for VisitSocial HistoryVisit Diagnoses Melany Guernsey Encounter Summary, generated on Jan. 23, 2019 Printout Information  Document Contents Document Received Date Document Source Organization  Office Visit Jan. 23, 2019 [facility]   Patient Demographics - 77 y.o. Male; born 12/11/1940   Patient Address Communication Language Race / Ethnicity  [address] [place], [place] [address] [phone] [facility]) [phone] (Home) [email or web address] English (Preferred) White / Not Hispanic or Latino   Reason for Visit   Reason Comments  Coronary Artery Disease myoview and echo  Hypertension   Hyperlipidemia   Bradycardia review holter    Encounter Details   Date Type Department Care Team Description  04/17/2017 Office Visit [facility]  [address]  [place], [place] [address]  [phone]  Olena Heckle, MD  [address]  [facility]  [place], [place] [address]  [phone]  [phone] (Fax)  Third degree AV block (CMS-HCC) (Primary Dx);  Bradycardia;  Hx of coronary artery disease;  Essential hypertension   Social History - as of this encounter  Tobacco Use Types Packs/Day Years Used Date  Former Smoker Cigarettes 2 30 Quit: 04/11/2001  Smokeless Tobacco: Never Used      Alcohol Use Drinks/Week oz/Week Comments  Yes 0 Standard drinks or equivalent  0.0  Occasional- wine   Sex Assigned at [employer] Date Recorded  Not on file    Job Start Date Occupation Industry  Not on file Not on file Not on file   Travel History Travel Start Travel End  No recent travel history available.     Last Filed Vital Signs - in this encounter  Vital Sign Reading Time Taken  Blood Pressure 130/60 04/17/2017 3:02 PM EST    Pulse 53 04/17/2017 3:02 PM EST  Temperature - -  Respiratory Rate - -  Oxygen Saturation 94% 04/17/2017 3:02 PM EST  Inhaled Oxygen Concentration - -  Weight 146.1 kg (322 lb) 04/17/2017 3:02 PM EST  Height 175.3 cm (5\' 9" ) 04/17/2017 3:02 PM EST  Body Mass Index 47.55 04/17/2017 3:02 PM EST   Progress Notes - in this encounter  Olena Heckle, MD - 04/17/2017 3:00 PM EST Formatting of this note might be different from the original. Established Patient Visit   Chief Complaint: Chief Complaint  Patient presents with  . Coronary Artery Disease  myoview and echo  . Hypertension  . Hyperlipidemia  . Bradycardia  review holter  Date of Service: 04/17/2017 Date of Birth: [birth year] PCP: Marisue Ivan, MD  History of Present Illness: Walter Knox is a 77 y.o.male patient  Bradycardia The patient has a new problem of dizziness, shortness of breath and slow pulse worsening with increased severity over the last 4 months with ecg, pulse rate and holter changes consistent with sinus bradycardia, heart block and sick sinus syndrome with a differential diagnosis and/or exacerbation by sick sinus syndrome. We have discussed possible diagnostics and or treatment options for this issue. This may be relieved by pacer placement  Holter The holter monitor shows occasional PVCs, heart block and symptomatic bradycardia  Stress Test Exercise SESTAMIBI was performed showing Normal test. other than chonontropic incometence and symptomatic bradycardia Coronary Artery Disease The patient has coronary artery disease previously diagnosed by imaging study years ago currently on appropriate medication management including ace inhibitor, aspirin and statin for risk factor reduction. Current risk factors for progression include age, male,  HTN and Hyperlipidemia. The patient appears not to have any significant side effects of treatment at this time and appears stable at this time without evidence of  clinical progression of the disease process  Results for orders placed or performed in visit on 04/05/17  ECG 12-lead  Result Value Ref Range  Vent Rate (bpm) 40  QRS Interval (msec) 86  QT Interval (msec) 480  QTc (msec) 391  AMBULATORY ECG ANALYSIS  Narrative  Ordered by an unspecified provider.   Past Medical and Surgical History  Past Medical History Past Medical History:  Diagnosis Date  . Borderline diabetes  A1C 6%- 04/2013; diet-controlled  . BPH (benign prostatic hypertrophy)  . Hx of coronary artery disease  S/P stent placement of left anterior descending artery, 08/1999- followed by Dr. Gwen Pounds  . Hyperlipidemia, unspecified  LDL 85- 04/2013  . Hypertension  . Morbid obesity (CMS-HCC)  . Peripheral vascular disease (CMS-HCC)  With carotid atherosclerosis.   Past Surgical History He has a past surgical history that includes Coronary stent placement (08/1999); Left TKR surgery (Left, 2006); Right TKR surgery (2009); Tonsillectomy; Pilondial cyst removal; Colonoscopy (05/19/2011,02/07/2006,03/03/2004); egd (02/07/2006); and Knee arthroscopy (Left, January 2000).   Medications and Allergies  Current Medications  Current Outpatient Medications on File Prior to Visit  Medication Sig Dispense Refill  . acetaminophen (TYLENOL) 650 MG ER tablet Take 650 mg by mouth 2 (two) times daily  . amLODIPine (NORVASC) 10 MG tablet TAKE 1 TABLET DAILY 90 tablet 1  . aspirin 81 MG EC tablet Take 81 mg by mouth once daily  . calcium polycarbophil (FIBERCON) 625 mg tablet Take 1,250 mg by mouth 2 (two) times daily  . celecoxib (CELEBREX) 200 MG capsule Take 1 capsule (200 mg total) by mouth 2 (two) times daily 180 capsule 1  . hydroCHLOROthiazide (HYDRODIURIL) 25 MG tablet TAKE 1 TABLET EVERY MORNING FOR BLOOD PRESSURE 90 tablet 1  . loratadine (CLARITIN) 10 mg tablet Take 10 mg by mouth once daily.  Marland Kitchen losartan (COZAAR) 100 MG tablet TAKE 1 TABLET DAILY 90 tablet 1  . multivitamin tablet  Take 1 tablet by mouth once daily.  . niacin (NIASPAN) 1000 MG ER tablet TAKE 1 TABLET EVERY NIGHT 90 tablet 1  . omega-3 fatty acids-vitamin E (FISH OIL) 1,000 mg Take 1 capsule by mouth 2 (two) times daily.  Marland Kitchen omeprazole (PRILOSEC) 20 MG DR capsule Take 20 mg by mouth once daily  . polyethylene glycol (MIRALAX) powder Take as directed for colonic prep. 255 g 0  . simvastatin (ZOCOR) 40 MG tablet TAKE ONE-HALF (1/2) TABLET NIGHTLY 45 tablet 1   No current facility-administered medications on file prior to visit.   Allergies: Patient has no known allergies.  Social and Family History  Social History reports that he quit smoking about 16 years ago. His smoking use included cigarettes. He has a 60.00 pack-year smoking history. He has never used smokeless tobacco. He reports that he drinks alcohol. He reports that he does not use drugs.  Family History Family History  Problem Relation Age of Onset  . High blood pressure (Hypertension) Mother  . Diabetes type II Mother  . Heart disease Mother  Had bypass surgery.  . Stroke Father  . Other Sister  Suspected lung disease   Review of Systems   Review of Systems  Positive for bradycardia Negative for weight gain weight loss, weakness, vision change, hearing loss, cough, congestion, PND, orthopnea, heartburn, nausea, diaphoresis, vomiting, diarrhea, bloody stool, melena, stomach pain, extremity  pain, leg weakness, leg cramping, leg blood clots, headache, blackouts, nosebleed, trouble swallowing, mouth pain, urination at night, muscle weakness, skin lesions, skin rashes, tingling ,ulcers, numbness, anxiety, and/or depression Physical Examination   Vitals:BP 130/60  Pulse 53  Ht 175.3 cm (5\' 9" )  Wt (!) 146.1 kg (322 lb)  SpO2 94%  BMI 47.55 kg/m  Ht:175.3 cm (5\' 9" ) Wt:(!) 146.1 kg (322 lb) ZOX:WRUE surface area is 2.67 meters squared. Body mass index is 47.55 kg/m. Appearance: well appearing in no acute distress HEENT: Pupils  equally reactive to light and accomodation, no xanthalasma  Neck: Supple, no apparent thyromegaly, masses, or lymphadenopathy  Lungs: normal respiratory effort; no crackles, no rhonchi, no wheezes Heart: irregular rate and rhythm. Normal S1 S2 No gallops, murmur, no rub, PMI is normal size and placement. carotid upstroke normal without bruit. Jugular venous pressure is normal Abdomen: soft, nontender, distended with normal bowel sounds. No apparent hepatosplenomegally. Abdominal aorta is normal size without bruit Extremities: no edema, no ulcers, no clubbing, no cyanosis Peripheral Pulses: 2+ in upper extremities, 2+ femoral pulses bilaterally, 2+lower extremity  Musculoskeletal; Normal muscle tone without kyphosis Neurological: Oriented and Alert, Cranial nerves intact  Assessment   77 y.o. male with  Encounter Diagnoses  Name Primary?  . Bradycardia  . Hx of coronary artery disease  . Essential hypertension  . Third degree AV block (CMS-HCC) Yes   Plan  -The patient is to have consultation and permanent pacemaker placement for sick sinus syndrome, symptomatic bradycardia, complete heart block and second degree heart block. The patient understands all risks and benefits of permanent pacemaker placement. This includes the possibility of death, stroke, heart attack, hemopericardium, pneumothorax, infection, bleeding, blood clot, and reaction to medications. The patient is at low risk for general anesthesia  No orders of the defined types were placed in this encounter.  No follow-ups on file.  Olena Heckle, MD    Electronically Signed by Olena Heckle, MD on 04/17/2017 3:00 PM EST     Plan of Treatment - as of this encounter  Upcoming Encounters Upcoming Encounters  Date Type Specialty Care Team Description  05/29/2017 Office Visit Cardiology Olena Heckle, MD  [address]  [facility]  [place], [place] [address]   [phone]  [phone] ([phone]    09/11/2017 Ancillary Orders Lab Marisue Ivan, MD  [address] [facility] [address]  [facility] [place], [place] [address]  [phone]  [phone] (Fax)    09/13/2017 Office Visit Internal Medicine Marisue Ivan, MD  [address] [facility] [facility] [address]  [facility] [place], [place] [address]  [phone]  [phone] (Fax)     Goals - as of this encounter  Goal Patient Goal Type Associated Problems Recent Progress Patient-Stated? Author  Try to increase consistency of walking  General   Yes Jerilynn Som, RN  Lose Weight  Weight   Yes Jerilynn Som, RN  Note:   Try to get weight under 300 pounds  Electronically Signed by Jerilynn Som, RN on 03/15/2017 2:55 PM EST    Procedures - in this encounter  Procedure Name Priority Date/Time Associated Diagnosis Comments  EXTERNAL RADIOLOGY RESULT - CHEST  04/25/2017 12:00 AM EST  Results for this procedure are in the results section.    Imaging Results - in this encounter   EXTERNAL RADIOLOGY RESULT - CHEST (04/25/2017 12:00 AM EST) EXTERNAL RADIOLOGY RESULT - CHEST (04/25/2017 12:00 AM EST)  Narrative Performed At  This result has an attachment  that is not available.  Ordered by an unspecified provider.        Visit Diagnoses - in this encounter  Diagnosis  Third degree AV block (CMS-HCC) - Primary  Atrioventricular block, complete   Bradycardia  Other specified cardiac dysrhythmias   Hx of coronary artery disease   Essential hypertension    Images Document Information  Primary Care Provider Other Service Providers Document Coverage Dates  Marisue Ivan MD (Mar. 21, 2015 - Present) [phone] (Work) [phone] (Fax) [phone] [facility] [address] [facility] [place], [place] [address]   Jan. 07, 2019   Custodian Organization  [facility] [address] [place] [address] [place], [place] [address]   Encounter Providers  Encounter Date  Olena Heckle MD (Attending) [phone] (Work) [phone] (Fax) [address] [facility] [place], [place] [address]  Jan. 07, 2019    Show All Sections

## 2017-05-03 NOTE — Plan of Care (Signed)
  Education: Knowledge of General Education information will improve 05/03/2017 1719 - Progressing by Alen Blew, RN   Health Behavior/Discharge Planning: Ability to manage health-related needs will improve 05/03/2017 1719 - Progressing by Alen Blew, RN   Education: Knowledge of cardiac device and self-care will improve 05/03/2017 1719 - Progressing by Alen Blew, RN

## 2017-05-03 NOTE — Anesthesia Preprocedure Evaluation (Signed)
Anesthesia Evaluation  Patient identified by MRN, date of birth, ID band Patient awake    Reviewed: Allergy & Precautions, NPO status , Patient's Chart, lab work & pertinent test results  Airway Mallampati: II  TM Distance: <3 FB     Dental  (+) Teeth Intact   Pulmonary shortness of breath and with exertion, former smoker,    Pulmonary exam normal        Cardiovascular hypertension, Pt. on medications + angina + CAD and + Peripheral Vascular Disease  Normal cardiovascular exam+ dysrhythmias      Neuro/Psych negative neurological ROS  negative psych ROS   GI/Hepatic Neg liver ROS,   Endo/Other  Morbid obesity  Renal/GU negative Renal ROS     Musculoskeletal negative musculoskeletal ROS (+)   Abdominal Normal abdominal exam  (+)   Peds  Hematology negative hematology ROS (+)   Anesthesia Other Findings   Reproductive/Obstetrics                             Anesthesia Physical  Anesthesia Plan  ASA: III  Anesthesia Plan: General and MAC   Post-op Pain Management:    Induction: Intravenous  PONV Risk Score and Plan:   Airway Management Planned: Nasal Cannula  Additional Equipment:   Intra-op Plan:   Post-operative Plan:   Informed Consent: I have reviewed the patients History and Physical, chart, labs and discussed the procedure including the risks, benefits and alternatives for the proposed anesthesia with the patient or authorized representative who has indicated his/her understanding and acceptance.   Dental advisory given  Plan Discussed with: CRNA and Surgeon  Anesthesia Plan Comments:         Anesthesia Quick Evaluation

## 2017-05-03 NOTE — Transfer of Care (Signed)
Immediate Anesthesia Transfer of Care Note  Patient: Walter Knox  Procedure(s) Performed: INSERTION PACEMAKER-DUAL CHAMBER INITIAL INSERT (N/A )  Patient Location: PACU  Anesthesia Type:General  Level of Consciousness: awake, alert  and oriented  Airway & Oxygen Therapy: Patient Spontanous Breathing  Post-op Assessment: Report given to RN  Post vital signs: Reviewed and stable  Last Vitals:  Vitals:   05/03/17 1118 05/03/17 1413  BP: (!) 181/63   Pulse: (!) 47   Resp: 16   Temp: (!) 36.2 C (!) 36.1 C  SpO2: 96%     Last Pain:  Vitals:   05/03/17 1118  TempSrc: Oral         Complications: No apparent anesthesia complications

## 2017-05-03 NOTE — Op Note (Signed)
[facility] Cardiology   05/03/2017                     2:14 PM  PATIENT:  Walter Knox    PRE-OPERATIVE DIAGNOSIS:  SSS  POST-OPERATIVE DIAGNOSIS:  Same  PROCEDURE:  INSERTION PACEMAKER-DUAL CHAMBER INITIAL INSERT  SURGEON:  Isaias Cowman, MD    ANESTHESIA:     PREOPERATIVE INDICATIONS:  [name] is a  77 y.o. male with a diagnosis of SSS who failed conservative measures and elected for surgical management.    The risks benefits and alternatives were discussed with the patient preoperatively including but not limited to the risks of infection, bleeding, cardiopulmonary complications, the need for revision surgery, among others, and the patient was willing to proceed.   OPERATIVE PROCEDURE: The patient was brought to the operating room the fasting state.  The left pectoral region was prepped and draped in usual sterile manner.  Anesthesia was obtained 1% lidocaine locally.  A 6 cm incision was performed the left pectoral region.  Pacemaker pocket was generated by electrocautery and blunt dissection.  Access was obtained to the left subclavian vein by fine-needle aspiration.  MRI compatible leads were positioned into the right ventricular apical septum ( Medtronic [id number] ) and right atrial appendage ( Medtronic [id number] ) under fluoroscopic guidance.  After proper thresholds were obtained the leads were sutured in place.  The leads were connected to a MRI compatible dual-chamber rate responsive pacemaker generator ( Medtronic [id number] ).  Pacemaker pocket was irrigated with gentamicin solution.  The pacemaker generator was positioned into the pocket and the pocket was closed with 2-0 and 4-0 Vicryl, respectively.  Steri-Strips and pressure dressing were applied.  There were no periprocedural complications.  Postprocedural interrogation revealed appropriate dual-chamber atrial and ventricular sensing and pacing thresholds.

## 2017-05-03 NOTE — Anesthesia Post-op Follow-up Note (Signed)
Anesthesia QCDR form completed.        

## 2017-05-03 NOTE — Anesthesia Procedure Notes (Signed)
Procedure Name: MAC Date/Time: 05/03/2017 12:45 PM Performed by: Allean Found, CRNA Pre-anesthesia Checklist: Patient identified, Emergency Drugs available, Suction available, Patient being monitored and Timeout performed Patient Re-evaluated:Patient Re-evaluated prior to induction Oxygen Delivery Method: Nasal cannula Placement Confirmation: positive ETCO2 Dental Injury: Teeth and Oropharynx as per pre-operative assessment

## 2017-05-03 NOTE — Anesthesia Postprocedure Evaluation (Signed)
Anesthesia Post Note  Patient: Walter Knox  Procedure(s) Performed: INSERTION PACEMAKER-DUAL CHAMBER INITIAL INSERT (N/A )  Patient location during evaluation: PACU Anesthesia Type: MAC Level of consciousness: awake and alert and oriented Pain management: pain level controlled Vital Signs Assessment: post-procedure vital signs reviewed and stable Respiratory status: spontaneous breathing Cardiovascular status: blood pressure returned to baseline Anesthetic complications: no     Last Vitals:  Vitals:   05/03/17 1413 05/03/17 1415  BP: 121/84 121/84  Pulse: 62 (!) 59  Resp: 15 17  Temp: (!) 36.1 C (!) 36 C  SpO2: 96% 96%    Last Pain:  Vitals:   05/03/17 1413  TempSrc:   PainSc: 0-No pain                 [name]

## 2017-05-04 ENCOUNTER — Encounter: Payer: Self-pay | Admitting: Cardiology

## 2017-05-04 DIAGNOSIS — I495 Sick sinus syndrome: Secondary | ICD-10-CM | POA: Diagnosis not present

## 2017-05-04 MED ORDER — CEPHALEXIN 250 MG PO CAPS: 250.0000 mg | ORAL_CAPSULE | Freq: Four times a day (QID) | ORAL | 0 refills | Status: DC

## 2017-05-04 MED ORDER — SODIUM CHLORIDE 0.9% FLUSH
3.0000 mL | Freq: Two times a day (BID) | INTRAVENOUS | Status: DC
  Administered 2017-05-04: 3 mL via INTRAVENOUS

## 2017-05-04 MED ORDER — FUROSEMIDE 20 MG PO TABS: 20.0000 mg | ORAL_TABLET | Freq: Every day | ORAL | 0 refills | Status: DC

## 2017-05-04 NOTE — Discharge Instructions (Signed)
For 6 weeks, avoid lifting greater than 15 pounds or raising your left arm above your head. You may shower in 24 hours, but avoid direct contact with the shower head to incision site. If the steri strips get wet, pat dry. Leave steri-strips alone. They will be removed in 10 days to 2 weeks in the office.  Medtronic Carelink box: plug in at home within 10 feet of where you sleep. Turn it on. You do not need to bring it to your follow-up appointment. It will be utilized for interrogation every 6 months.

## 2017-05-04 NOTE — Care Management Obs Status (Signed)
[facility] NOTIFICATION   Patient Details  Name: Walter Knox MRN: [id number] Date of Birth: 1940-07-28   Medicare Observation Status Notification Given:  Yes    Shelbie Ammons, RN 05/04/2017, 8:29 AM

## 2017-05-04 NOTE — Discharge Summary (Signed)
Physician Discharge Summary  Patient ID: KALIK ZECHER MRN: [id number] DOB/AGE: 1940-05-15 77 y.o.  Admit date: 05/03/2017 Discharge date: 05/04/2017  Primary Discharge Diagnosis Sick Sinus Syndrome Secondary Discharge Diagnosis Same  Significant Diagnostic Studies: Chest xray negative for pneumothorax  Consults: cardiology  Hospital Course: 77 year old male with a diagnosis of sick sinus syndrome who failed conservative management and opted for elective surgical management.  The patient successfully underwent dual-chamber pacemaker implantation on 05/03/2017 without significant perioperative complications.  Chest x-ray this morning without pneumothorax with mild cardiomegaly and central pulmonary vascular congestion suggestive of low-grade CHF. Will prescribe 5 days of furosemide.  The patient denies shortness of breath as he lies at a slight incline in bed.  He denies significant tenderness to the incision site.  ECG this morning reveals atrial-sensed ventricular-paced rhythm at a rate of 64 bpm.   Discharge Exam: Blood pressure (!) 163/72, pulse (!) 59, temperature 97.9 F (36.6 C), temperature source Oral, resp. rate 18, height 5\' 10"  (1.778 m), weight (!) 142.4 kg (314 lb), SpO2 93 %.   General appearance: alert, cooperative, appears stated age and no distress Head: Normocephalic, without obvious abnormality, atraumatic Back: no kyphosis present Resp: clear to auscultation bilaterally Cardio: S1, S2 normal and 2-3/6 systolic murmur Extremities: extremities normal, atraumatic, no cyanosis or edema Incision site: Left upper chest wall, Steri-Strips intact, no surrounding erythema, warmth, ecchymosis.  No active drainage or discharge.  No significant tenderness to palpation. Labs:   Lab Results  Component Value Date   WBC 8.3 04/25/2017   HGB 15.8 04/25/2017   HCT 46.5 04/25/2017   MCV 88.9 04/25/2017   PLT 204 04/25/2017   No results for input(s): NA, K, CL, CO2, BUN,  CREATININE, CALCIUM, PROT, BILITOT, ALKPHOS, ALT, AST, GLUCOSE in the last 168 hours.  Invalid input(s): LABALBU    Radiology: Chest xray negative for pneumothorax. Mild pulmonary congestion EKG: Atrial sensed ventricular paced rhythm  FOLLOW UP PLANS AND APPOINTMENTS  Allergies as of 05/04/2017   No Known Allergies     Medication List    STOP taking these medications   HYDROcodone-acetaminophen 5-325 MG tablet Commonly known as:  NORCO/VICODIN     TAKE these medications   acetaminophen 650 MG CR tablet Commonly known as:  TYLENOL Take 650 mg by mouth 2 (two) times daily.   amLODipine 10 MG tablet Commonly known as:  NORVASC Take 10 mg by mouth daily.   aspirin EC 81 MG tablet Take 81 mg by mouth daily.   celecoxib 200 MG capsule Commonly known as:  CELEBREX Take 200 mg by mouth 2 (two) times daily.   cephALEXin 250 MG capsule Commonly known as:  KEFLEX Take 1 capsule (250 mg total) by mouth 4 (four) times daily.   cetirizine 10 MG tablet Commonly known as:  ZYRTEC Take 10 mg by mouth daily.   Fish Oil 1000 MG Caps Take 1,000 mg by mouth 2 (two) times daily.   furosemide 20 MG tablet Commonly known as:  LASIX Take 1 tablet (20 mg total) by mouth daily.   hydrochlorothiazide 25 MG tablet Commonly known as:  HYDRODIURIL Take 25 mg by mouth daily.   loratadine 10 MG tablet Commonly known as:  CLARITIN Take 10 mg by mouth daily.   losartan 100 MG tablet Commonly known as:  COZAAR Take 100 mg by mouth daily.   multivitamin tablet Take 1 tablet by mouth daily.   niacin 1000 MG CR tablet Commonly known as:  NIASPAN Take  1,000 mg by mouth at bedtime.   omeprazole 20 MG capsule Commonly known as:  PRILOSEC Take 20 mg by mouth at bedtime.   polycarbophil 625 MG tablet Commonly known as:  FIBERCON Take 1,250 mg by mouth 2 (two) times daily.   simvastatin 40 MG tablet Commonly known as:  ZOCOR Take 20 mg by mouth at bedtime.      Follow-up  Information    Lamar Blinks, MD. Go in 1 week(s).   Specialty:  Cardiology Contact information: [address] [facility] [place] [address] [phone]           BRING ALL MEDICATIONS WITH YOU TO FOLLOW UP APPOINTMENTS  Time spent with patient to include physician time: 30 minutes Signed:  Leanora Ivanoff PA-C 05/04/2017, 8:35 AM

## 2017-08-08 ENCOUNTER — Encounter: Payer: Self-pay | Admitting: *Deleted

## 2017-08-09 ENCOUNTER — Ambulatory Visit: Payer: Medicare Other | Admitting: Certified Registered"

## 2017-08-09 ENCOUNTER — Ambulatory Visit
Admission: RE | Admit: 2017-08-09 | Discharge: 2017-08-09 | Disposition: A | Discharge status: Home / Self Care | Payer: Medicare Other | Source: Ambulatory Visit | Attending: Internal Medicine | Admitting: Internal Medicine

## 2017-08-09 ENCOUNTER — Encounter: Payer: Self-pay | Admitting: *Deleted

## 2017-08-09 ENCOUNTER — Encounter
Admission: RE | Disposition: A | Discharge status: Home / Self Care | Payer: Self-pay | Source: Ambulatory Visit | Attending: Internal Medicine

## 2017-08-09 DIAGNOSIS — I10 Essential (primary) hypertension: Secondary | ICD-10-CM | POA: Insufficient documentation

## 2017-08-09 DIAGNOSIS — R7303 Prediabetes: Secondary | ICD-10-CM | POA: Insufficient documentation

## 2017-08-09 DIAGNOSIS — Z6841 Body Mass Index (BMI) 40.0 and over, adult: Secondary | ICD-10-CM | POA: Insufficient documentation

## 2017-08-09 DIAGNOSIS — Z1211 Encounter for screening for malignant neoplasm of colon: Secondary | ICD-10-CM | POA: Diagnosis present

## 2017-08-09 DIAGNOSIS — Z86010 Personal history of colonic polyps: Secondary | ICD-10-CM | POA: Diagnosis not present

## 2017-08-09 DIAGNOSIS — K219 Gastro-esophageal reflux disease without esophagitis: Secondary | ICD-10-CM | POA: Diagnosis not present

## 2017-08-09 DIAGNOSIS — E785 Hyperlipidemia, unspecified: Secondary | ICD-10-CM | POA: Insufficient documentation

## 2017-08-09 DIAGNOSIS — Z7982 Long term (current) use of aspirin: Secondary | ICD-10-CM | POA: Diagnosis not present

## 2017-08-09 DIAGNOSIS — D175 Benign lipomatous neoplasm of intra-abdominal organs: Secondary | ICD-10-CM | POA: Diagnosis not present

## 2017-08-09 DIAGNOSIS — I2510 Atherosclerotic heart disease of native coronary artery without angina pectoris: Secondary | ICD-10-CM | POA: Insufficient documentation

## 2017-08-09 DIAGNOSIS — K640 First degree hemorrhoids: Secondary | ICD-10-CM | POA: Diagnosis not present

## 2017-08-09 DIAGNOSIS — Z79899 Other long term (current) drug therapy: Secondary | ICD-10-CM | POA: Insufficient documentation

## 2017-08-09 DIAGNOSIS — I495 Sick sinus syndrome: Secondary | ICD-10-CM | POA: Diagnosis not present

## 2017-08-09 DIAGNOSIS — N400 Enlarged prostate without lower urinary tract symptoms: Secondary | ICD-10-CM | POA: Diagnosis not present

## 2017-08-09 HISTORY — PX: COLONOSCOPY WITH PROPOFOL: SHX5780

## 2017-08-09 HISTORY — DX: Malignant (primary) neoplasm, unspecified: C80.1

## 2017-08-09 HISTORY — DX: Sick sinus syndrome: I49.5

## 2017-08-09 LAB — GLUCOSE, CAPILLARY: Glucose-Capillary: 107 mg/dL — ABNORMAL HIGH (ref 65–99)

## 2017-08-09 SURGERY — COLONOSCOPY WITH PROPOFOL
Anesthesia: General

## 2017-08-09 MED ORDER — PROPOFOL 10 MG/ML IV BOLUS
INTRAVENOUS | Status: AC
  Filled 2017-08-09: qty 40

## 2017-08-09 MED ORDER — PHENYLEPHRINE HCL 10 MG/ML IJ SOLN
INTRAMUSCULAR | Status: DC | PRN
  Administered 2017-08-09: 200 ug via INTRAVENOUS
  Administered 2017-08-09 (×2): 100 ug via INTRAVENOUS

## 2017-08-09 MED ORDER — SODIUM CHLORIDE 0.9 % IV SOLN
INTRAVENOUS | Status: DC
  Administered 2017-08-09: 13:00:00 via INTRAVENOUS

## 2017-08-09 MED ORDER — PROPOFOL 10 MG/ML IV BOLUS
INTRAVENOUS | Status: DC | PRN
  Administered 2017-08-09: 100 mg via INTRAVENOUS

## 2017-08-09 MED ORDER — PROPOFOL 500 MG/50ML IV EMUL
INTRAVENOUS | Status: DC | PRN
  Administered 2017-08-09: 125 ug/kg/min via INTRAVENOUS

## 2017-08-09 MED ORDER — LIDOCAINE HCL (CARDIAC) PF 100 MG/5ML IV SOSY
PREFILLED_SYRINGE | INTRAVENOUS | Status: DC | PRN
  Administered 2017-08-09: 100 mg via INTRAVENOUS

## 2017-08-09 NOTE — Interval H&P Note (Signed)
History and Physical Interval Note:  08/09/2017 1:06 PM  Walter Knox  has presented today for surgery, with the diagnosis of HX COLON POLYPS  The various methods of treatment have been discussed with the patient and family. After consideration of risks, benefits and other options for treatment, the patient has consented to  Procedure(s): COLONOSCOPY WITH PROPOFOL (N/A) as a surgical intervention .  The patient's history has been reviewed, patient examined, no change in status, stable for surgery.  I have reviewed the patient's chart and labs.  Questions were answered to the patient's satisfaction.     [place], Patrick

## 2017-08-09 NOTE — Anesthesia Post-op Follow-up Note (Signed)
Anesthesia QCDR form completed.        

## 2017-08-09 NOTE — Transfer of Care (Signed)
Immediate Anesthesia Transfer of Care Note  Patient: Walter Knox  Procedure(s) Performed: COLONOSCOPY WITH PROPOFOL (N/A )  Patient Location: PACU  Anesthesia Type:General  Level of Consciousness: awake and drowsy  Airway & Oxygen Therapy: Patient Spontanous Breathing and Patient connected to nasal cannula oxygen  Post-op Assessment: Report given to RN and Post -op Vital signs reviewed and stable  Post vital signs: Reviewed and stable  Last Vitals:  Vitals Value Taken Time  BP    Temp    Pulse    Resp    SpO2      Last Pain:  Vitals:   08/09/17 1241  TempSrc: Tympanic  PainSc: 0-No pain      Patients Stated Pain Goal: 0 (03/00/92 3300)  Complications: No apparent anesthesia complications

## 2017-08-09 NOTE — H&P (Signed)
Outpatient short stay form Pre-procedure 08/09/2017 1:05 PM Trina Asch K. Alice Reichert, M.D.  Primary Physician: Meriel Flavors, M.D.  Reason for visit:  Personal hx of colon polyps.  History of present illness:  Patient presents for colon polyp surveillance. The patient denies abdominal pain, abnormal weight loss or rectal bleeding.   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 He has undergone cardiac clearance for this procedure.   Current Facility-Administered Medications:  .  0.9 %  sodium chloride infusion, , Intravenous, Continuous, [place], Benay Pike, MD, Last Rate: 20 mL/hr at 08/09/17 1303  Medications Prior to Admission  Medication Sig Dispense Refill Last Dose  . acetaminophen (TYLENOL) 650 MG CR tablet Take 650 mg by mouth 2 (two) times daily.   Past Week at Unknown time  . aspirin EC 81 MG tablet Take 81 mg by mouth daily.   Past Week at 645  . celecoxib (CELEBREX) 200 MG capsule Take 200 mg by mouth 2 (two) times daily.   Past Week at Unknown time  . cetirizine (ZYRTEC) 10 MG tablet Take 10 mg by mouth daily.   08/08/2017 at Unknown time  . hydrochlorothiazide (HYDRODIURIL) 25 MG tablet Take 25 mg by mouth daily.   08/09/2017 at Unknown time  . loratadine (CLARITIN) 10 MG tablet Take 10 mg by mouth daily.   08/08/2017 at Unknown time  . losartan (COZAAR) 100 MG tablet Take 100 mg by mouth daily.   08/09/2017 at 645  . Multiple Vitamin (MULTIVITAMIN) tablet Take 1 tablet by mouth daily.   Past Week at Unknown time  . niacin (NIASPAN) 1000 MG CR tablet Take 1,000 mg by mouth at bedtime.   Past Week at Unknown time  . Omega-3 Fatty Acids (FISH OIL) 1000 MG CAPS Take 1,000 mg by mouth 2 (two) times daily.   Past Week at Unknown time  . omeprazole (PRILOSEC) 20 MG capsule Take 20 mg by mouth at bedtime.    08/08/2017 at Unknown time  . polycarbophil (FIBERCON) 625 MG tablet Take 1,250 mg by mouth 2 (two) times daily.    Past Week at Unknown time  .  simvastatin (ZOCOR) 40 MG tablet Take 20 mg by mouth at bedtime.    08/08/2017 at Unknown time  . amLODipine (NORVASC) 10 MG tablet Take 10 mg by mouth daily.   05/03/2017 at Unknown time  . cephALEXin (KEFLEX) 250 MG capsule Take 1 capsule (250 mg total) by mouth 4 (four) times daily. 28 capsule 0   . furosemide (LASIX) 20 MG tablet Take 1 tablet (20 mg total) by mouth daily. 5 tablet 0      No Known Allergies   Past Medical History:  Diagnosis Date  . Anginal pain ([place])   . BPH (benign prostatic hyperplasia)   . CAD (coronary artery disease)   . Cancer ([place])   . Chronic back pain   . Dyspnea   . Dysrhythmia   . Environmental and seasonal allergies   . Hyperlipidemia   . Hypertension   . Lower extremity edema   . Morbid obesity ([place])   . Pre-diabetes   . PVD (peripheral vascular disease) ([place])   . SSS (sick sinus syndrome) ([place])     Review of systems:   Otherwise negative.    Physical Exam  Gen: Alert, oriented. Appears stated age.  HEENT: [place]/AT. PERRLA. Lungs: CTA, no wheezes. CV: RR nl S1, S2. Abd: soft, benign, no masses. BS+ Ext: No  edema. Pulses 2+    Planned procedures: Colonoscopy. The patient understands the nature of the planned procedure, indications, risks, alternatives and potential complications including but not limited to bleeding, infection, perforation, damage to internal organs and possible oversedation/side effects from anesthesia. The patient agrees and gives consent to proceed.  Please refer to procedure notes for findings, recommendations and patient disposition/instructions.    Deaire Mcwhirter K. Alice Reichert, M.D. Gastroenterology 08/09/2017  1:05 PM

## 2017-08-09 NOTE — Op Note (Signed)
[facility] Gastroenterology Patient Name: Walter Knox Procedure Date: 08/09/2017 1:10 PM MRN: [id number] Account #: [id number] Date of Birth: 13-Jul-1940 Admit Type: Outpatient Age: 77 Room: [facility] ENDO ROOM 3 Gender: Male Note Status: Finalized Procedure:            Colonoscopy Indications:          High risk colon cancer surveillance: Personal history                        of colonic polyps Providers:            Boykin Nearing. Tol[place] MD, MD Medicines:            Propofol per Anesthesia Complications:        No immediate complications. Procedure:            Pre-Anesthesia Assessment:                       - The risks and benefits of the procedure and the                        sedation options and risks were discussed with the                        patient. All questions were answered and informed                        consent was obtained.                       - Patient identification and proposed procedure were                        verified prior to the procedure by the nurse. The                        procedure was verified in the procedure room.                       - ASA Grade Assessment: III - A patient with severe                        systemic disease.                       - After reviewing the risks and benefits, the patient                        was deemed in satisfactory condition to undergo the                        procedure.                       After obtaining informed consent, the colonoscope was                        passed under direct vision. Throughout the procedure,                        the patient's blood pressure, pulse, and oxygen  saturations were monitored continuously. The                        Colonoscope was introduced through the anus and                        advanced to the the cecum, identified by appendiceal                        orifice and ileocecal valve. The colonoscopy was          somewhat difficult due to significant looping.                        Successful completion of the procedure was aided by                        applying abdominal pressure. The patient tolerated the                        procedure well. The quality of the bowel preparation                        was adequate to identify polyps. Findings:      The perianal and digital rectal examinations were normal. Pertinent       negatives include normal sphincter tone and no palpable rectal lesions.      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 Biopsies were taken with a cold forceps for histology.      Non-bleeding internal hemorrhoids were found during retroflexion. The       hemorrhoids were Grade I (internal hemorrhoids that do not prolapse).      The exam was otherwise without abnormality.      A few small-mouthed diverticula were found in the sigmoid colon. 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 Biopsied.                       - Non-bleeding internal hemorrhoids.                       - The examination was otherwise normal. Recommendation:       - Patient has a contact number available for                        emergencies. The signs and symptoms of potential                        delayed complications were discussed with the patient.                        Return to normal activities tomorrow. Written discharge                        instructions were provided to the patient.                       - Resume previous diet.                       -  Continue present medications.                       - Await pathology results.                       - No repeat colonoscopy due to age.                       - Return to GI office PRN.                       - The findings and recommendations were discussed with                        the patient and their spouse. Procedure Code(s):    --- Professional ---                        [phone], Colonoscopy, flexible; with biopsy, single or                        multiple Diagnosis Code(s):    --- Professional ---                       K64.0, First degree hemorrhoids                       D17.5, Benign lipomatous neoplasm of intra-abdominal                        organs                       Z86.010, Personal history of colonic polyps CPT copyright 2017 American Medical Association. All rights reserved. The codes documented in this report are preliminary and upon coder review may  be revised to meet current compliance requirements. Stanton Kidney MD, MD 08/09/2017 1:43:53 PM This report has been signed electronically. Number of Addenda: 0 Note Initiated On: 08/09/2017 1:10 PM Scope Withdrawal Time: 0 hours 10 minutes 8 seconds  Total Procedure Duration: 0 hours 17 minutes 15 seconds       [facility]

## 2017-08-09 NOTE — Anesthesia Preprocedure Evaluation (Signed)
Anesthesia Evaluation  Patient identified by MRN, date of birth, ID band  Reviewed: Allergy & Precautions, NPO status , Patient's Chart, lab work & pertinent test results  History of Anesthesia Complications Negative for: history of anesthetic complications  Airway Mallampati: III       Dental   Pulmonary neg sleep apnea, neg COPD, former smoker,           Cardiovascular hypertension, Pt. on medications + CAD, + Cardiac Stents and + Peripheral Vascular Disease  + dysrhythmias (symptomatic bradycardia) + pacemaker      Neuro/Psych neg Seizures    GI/Hepatic Neg liver ROS, GERD  Medicated and Controlled,  Endo/Other  diabetes (borderline)  Renal/GU negative Renal ROS     Musculoskeletal   Abdominal   Peds  Hematology   Anesthesia Other Findings   Reproductive/Obstetrics                             Anesthesia Physical Anesthesia Plan  ASA: III  Anesthesia Plan: General   Post-op Pain Management:    Induction: Intravenous  PONV Risk Score and Plan: 2 and TIVA and Propofol infusion  Airway Management Planned: Nasal Cannula  Additional Equipment:   Intra-op Plan:   Post-operative Plan:   Informed Consent: I have reviewed the patients History and Physical, chart, labs and discussed the procedure including the risks, benefits and alternatives for the proposed anesthesia with the patient or authorized representative who has indicated his/her understanding and acceptance.     Plan Discussed with:   Anesthesia Plan Comments:         Anesthesia Quick Evaluation

## 2017-08-09 NOTE — Anesthesia Postprocedure Evaluation (Signed)
Anesthesia Post Note  Patient: Walter Knox  Procedure(s) Performed: COLONOSCOPY WITH PROPOFOL (N/A )  Patient location during evaluation: Endoscopy Anesthesia Type: General Level of consciousness: awake and alert Pain management: pain level controlled Vital Signs Assessment: post-procedure vital signs reviewed and stable Respiratory status: spontaneous breathing and respiratory function stable Cardiovascular status: stable Anesthetic complications: no     Last Vitals:  Vitals:   08/09/17 1341 08/09/17 1351  BP: (!) 87/61 96/66  Pulse: 66 64  Resp: 20 16  Temp: (!) 36.2 C   SpO2: 96% 98%    Last Pain:  Vitals:   08/09/17 1351  TempSrc:   PainSc: 0-No pain                 Zareth Rippetoe K

## 2017-08-10 ENCOUNTER — Encounter: Payer: Self-pay | Admitting: Internal Medicine

## 2017-08-10 LAB — SURGICAL PATHOLOGY

## 2018-03-26 DIAGNOSIS — I119 Hypertensive heart disease without heart failure: Secondary | ICD-10-CM | POA: Insufficient documentation

## 2018-03-26 DIAGNOSIS — I2510 Atherosclerotic heart disease of native coronary artery without angina pectoris: Secondary | ICD-10-CM | POA: Insufficient documentation

## 2018-09-21 DIAGNOSIS — Z950 Presence of cardiac pacemaker: Secondary | ICD-10-CM | POA: Insufficient documentation

## 2018-09-21 DIAGNOSIS — R011 Cardiac murmur, unspecified: Secondary | ICD-10-CM | POA: Insufficient documentation

## 2018-10-17 DIAGNOSIS — I4892 Unspecified atrial flutter: Secondary | ICD-10-CM | POA: Insufficient documentation

## 2019-05-06 ENCOUNTER — Ambulatory Visit: Payer: Medicare Other

## 2021-03-31 ENCOUNTER — Other Ambulatory Visit: Payer: Self-pay | Admitting: Student

## 2021-03-31 DIAGNOSIS — I2510 Atherosclerotic heart disease of native coronary artery without angina pectoris: Secondary | ICD-10-CM

## 2021-03-31 DIAGNOSIS — R0602 Shortness of breath: Secondary | ICD-10-CM

## 2021-03-31 DIAGNOSIS — I482 Chronic atrial fibrillation, unspecified: Secondary | ICD-10-CM

## 2021-04-15 ENCOUNTER — Other Ambulatory Visit: Payer: Self-pay

## 2021-04-15 ENCOUNTER — Ambulatory Visit
Admission: RE | Admit: 2021-04-15 | Discharge: 2021-04-15 | Disposition: A | Discharge status: Home / Self Care | Payer: Medicare Other | Source: Ambulatory Visit | Attending: Student | Admitting: Student

## 2021-04-15 ENCOUNTER — Encounter
Admission: RE | Admit: 2021-04-15 | Discharge: 2021-04-15 | Disposition: A | Discharge status: Home / Self Care | Payer: Medicare Other | Source: Ambulatory Visit | Attending: Student | Admitting: Student

## 2021-04-15 DIAGNOSIS — I482 Chronic atrial fibrillation, unspecified: Secondary | ICD-10-CM | POA: Insufficient documentation

## 2021-04-15 DIAGNOSIS — I10 Essential (primary) hypertension: Secondary | ICD-10-CM | POA: Insufficient documentation

## 2021-04-15 DIAGNOSIS — R0600 Dyspnea, unspecified: Secondary | ICD-10-CM | POA: Insufficient documentation

## 2021-04-15 DIAGNOSIS — I2510 Atherosclerotic heart disease of native coronary artery without angina pectoris: Secondary | ICD-10-CM | POA: Diagnosis present

## 2021-04-15 DIAGNOSIS — R0602 Shortness of breath: Secondary | ICD-10-CM | POA: Insufficient documentation

## 2021-04-15 LAB — ECHOCARDIOGRAM COMPLETE: S' Lateral: 2.9 cm

## 2021-04-15 MED ORDER — TECHNETIUM TC 99M TETROFOSMIN IV KIT
30.0000 | PACK | Freq: Once | INTRAVENOUS | Status: AC | PRN
  Administered 2021-04-15: 29.7 via INTRAVENOUS

## 2021-04-15 NOTE — Progress Notes (Signed)
*  PRELIMINARY RESULTS* Echocardiogram 2D Echocardiogram has been performed.  Sherrie Sport 04/15/2021, 9:40 AM

## 2021-04-16 ENCOUNTER — Encounter
Admission: RE | Admit: 2021-04-16 | Discharge: 2021-04-16 | Disposition: A | Discharge status: Home / Self Care | Payer: Medicare Other | Source: Ambulatory Visit | Attending: Student | Admitting: Student

## 2021-04-16 MED ORDER — TECHNETIUM TC 99M TETROFOSMIN IV KIT
30.0000 | PACK | Freq: Once | INTRAVENOUS | Status: AC | PRN
  Administered 2021-04-16: 29.52 via INTRAVENOUS

## 2021-04-22 LAB — NM MYOCAR MULTI W/SPECT W/WALL MOTION / EF
Angina Index: 0
Duke Treadmill Score: 1
Estimated workload: 1
Exercise duration (min): 1 min
Exercise duration (sec): 3 s
LV dias vol: 225 mL (ref 62–150)
LV sys vol: 125 mL
Nuc Stress EF: 44 %
Peak HR: 81 {beats}/min
Percent HR: 58 %
Rest HR: 80 {beats}/min
Rest Nuclear Isotope Dose: 29.7 mCi
SDS: 0
SRS: 17
SSS: 13
ST Depression (mm): 0 mm
Stress Nuclear Isotope Dose: 29.5 mCi
TID: 1.21

## 2021-05-03 ENCOUNTER — Other Ambulatory Visit: Payer: Self-pay

## 2021-05-03 ENCOUNTER — Ambulatory Visit
Admission: RE | Admit: 2021-05-03 | Discharge: 2021-05-03 | Disposition: A | Discharge status: Home / Self Care | Payer: Medicare Other | Source: Ambulatory Visit | Attending: Physician Assistant | Admitting: Physician Assistant

## 2021-05-03 ENCOUNTER — Other Ambulatory Visit: Payer: Self-pay | Admitting: Physician Assistant

## 2021-05-03 DIAGNOSIS — R197 Diarrhea, unspecified: Secondary | ICD-10-CM | POA: Insufficient documentation

## 2021-05-03 DIAGNOSIS — R1032 Left lower quadrant pain: Secondary | ICD-10-CM | POA: Diagnosis present

## 2021-05-03 LAB — POCT I-STAT CREATININE: Creatinine, Ser: 1.3 mg/dL — ABNORMAL HIGH (ref 0.61–1.24)

## 2021-05-03 MED ORDER — IOHEXOL 300 MG/ML  SOLN
100.0000 mL | Freq: Once | INTRAMUSCULAR | Status: AC | PRN
  Administered 2021-05-03: 100 mL via INTRAVENOUS

## 2022-02-10 DIAGNOSIS — G8929 Other chronic pain: Secondary | ICD-10-CM | POA: Insufficient documentation

## 2022-02-10 DIAGNOSIS — N1831 Chronic kidney disease, stage 3a: Secondary | ICD-10-CM | POA: Insufficient documentation

## 2022-06-01 NOTE — Anesthesia Preprocedure Evaluation (Deleted)
Anesthesia Evaluation  Patient identified by MRN, date of birth, ID band Patient awake    Reviewed: Allergy & Precautions, H&P , NPO status , Patient's Chart, lab work & pertinent test results  Airway Mallampati: III  TM Distance: >3 FB Neck ROM: full    Dental no notable dental hx.    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 Findings are equivocal. The study is intermediate risk.   No ST deviation was noted.   LV perfusion is equivocal. There is no evidence of ischemia. There is no evidence of infarction.   Left ventricular function is abnormal. Global function is moderately reduced. End systolic cavity size is moderately enlarged.   ECHO 1/23:  1. Left ventricular ejection fraction, by estimation, is 50 to 55%. The  left ventricle has low normal function. The left ventricle has no regional  wall motion abnormalities. Left ventricular diastolic parameters are  indeterminate.   2. Right ventricular systolic function is normal. The right ventricular  size is normal.   3. The mitral valve is normal in structure. No evidence of mitral valve  regurgitation. No evidence of mitral stenosis.   4. The aortic valve is normal in structure. Aortic valve regurgitation is  not visualized. Aortic valve sclerosis is present, with no evidence of  aortic valve stenosis.   5. The inferior vena cava is normal in size with greater than 50%  respiratory variability,  suggesting right atrial pressure of 3 mmHg.     Neuro/Psych negative neurological ROS  negative psych ROS   GI/Hepatic negative GI ROS, Neg liver ROS,,,  Endo/Other    Morbid obesity  Renal/GU negative Renal ROS  negative genitourinary   Musculoskeletal   Abdominal  (+) + obese  Peds  Hematology negative hematology ROS (+)   Anesthesia Other Findings Past Medical History: No date: Anginal pain ([facility]) No date: BPH (benign prostatic hyperplasia) No date: CAD (coronary artery disease) No date: Cancer ([facility]) No date: Chronic back pain No date: Dyspnea No date: Dysrhythmia No date: Environmental and seasonal allergies No date: Hyperlipidemia No date: Hypertension No date: Lower extremity edema No date: Morbid obesity ([place]) No date: Pre-diabetes No date: PVD (peripheral vascular disease) ([facility]) No date: SSS (sick sinus syndrome) ([place])  Past Surgical History: No date: COLONOSCOPY No date: COLONOSCOPY WITH ESOPHAGOGASTRODUODENOSCOPY (EGD) 03/31/2017: COLONOSCOPY WITH PROPOFOL; N/A     Comment:  Procedure: COLONOSCOPY WITH PROPOFOL;  Surgeon:               Lollie Sails, MD;  Location: [facility] ENDOSCOPY;                Service: Endoscopy;  Laterality: N/A; 08/09/2017: COLONOSCOPY WITH PROPOFOL; N/A     Comment:  Procedure: COLONOSCOPY WITH PROPOFOL;  Surgeon: Toledo,               Benay Pike, MD;  Location: [facility] ENDOSCOPY;  Service:               Gastroenterology;  Laterality: N/A; No date: CORONARY ANGIOPLASTY WITH  STENT PLACEMENT 2006 2011: JOINT REPLACEMENT; Bilateral No date: KNEE SURGERY 05/03/2017: PACEMAKER INSERTION; N/A     Comment:  Procedure: INSERTION PACEMAKER-DUAL CHAMBER INITIAL               INSERT;  Surgeon: Isaias Cowman, MD;  Location:               [facility] ORS;  Service: Cardiovascular;  Laterality: N/A; No date: Pilondial Cyst Removal No date: TONSILLECTOMY No date: Total Right Knee Replacement     Reproductive/Obstetrics negative OB ROS                              Anesthesia Physical Anesthesia Plan  ASA: 4  Anesthesia Plan: General   Post-op Pain Management:    Induction: Intravenous  PONV Risk Score and Plan: Propofol infusion and TIVA  Airway Management Planned: Natural Airway  Additional Equipment:   Intra-op Plan:   Post-operative Plan:   Informed Consent: I have reviewed the patients History and Physical, chart, labs and discussed the procedure including the risks, benefits and alternatives for the proposed anesthesia with the patient or authorized representative who has indicated his/her understanding and acceptance.     Dental Advisory Given  Plan Discussed with: CRNA and Surgeon  Anesthesia Plan Comments:         Anesthesia Quick Evaluation

## 2022-06-02 ENCOUNTER — Encounter
Admission: RE | Disposition: A | Discharge status: Home / Self Care | Payer: Self-pay | Source: Ambulatory Visit | Attending: Cardiology

## 2022-06-02 ENCOUNTER — Ambulatory Visit: Payer: Medicare Other | Admitting: Anesthesiology

## 2022-06-02 ENCOUNTER — Ambulatory Visit
Admission: RE | Admit: 2022-06-02 | Discharge: 2022-06-02 | Disposition: A | Discharge status: Home / Self Care | Payer: Medicare Other | Source: Ambulatory Visit | Attending: Cardiology | Admitting: Cardiology

## 2022-06-02 ENCOUNTER — Encounter: Payer: Self-pay | Admitting: Cardiology

## 2022-06-02 DIAGNOSIS — I4891 Unspecified atrial fibrillation: Secondary | ICD-10-CM | POA: Insufficient documentation

## 2022-06-02 DIAGNOSIS — Z01810 Encounter for preprocedural cardiovascular examination: Secondary | ICD-10-CM

## 2022-06-02 DIAGNOSIS — Z01818 Encounter for other preprocedural examination: Secondary | ICD-10-CM

## 2022-06-02 HISTORY — PX: CARDIOVERSION: SHX1299

## 2022-06-02 SURGERY — CARDIOVERSION
Anesthesia: General

## 2022-06-02 MED ORDER — PROPOFOL 10 MG/ML IV BOLUS
INTRAVENOUS | Status: AC
  Filled 2022-06-02: qty 20

## 2022-06-02 NOTE — Progress Notes (Signed)
Per Dr. Saralyn Pilar patient cannot be cardioverted due to unshockable rhythm. Will make a follow-up appointment for one week.

## 2022-06-17 ENCOUNTER — Other Ambulatory Visit: Payer: Self-pay | Admitting: Student

## 2022-06-17 DIAGNOSIS — I4892 Unspecified atrial flutter: Secondary | ICD-10-CM

## 2022-06-20 ENCOUNTER — Other Ambulatory Visit: Payer: Self-pay | Admitting: Student

## 2022-06-20 DIAGNOSIS — I4892 Unspecified atrial flutter: Secondary | ICD-10-CM

## 2022-06-23 ENCOUNTER — Encounter
Admission: RE | Admit: 2022-06-23 | Discharge: 2022-06-23 | Disposition: A | Discharge status: Home / Self Care | Payer: Medicare Other | Source: Ambulatory Visit | Attending: Student | Admitting: Student

## 2022-06-23 DIAGNOSIS — I4892 Unspecified atrial flutter: Secondary | ICD-10-CM | POA: Diagnosis present

## 2022-06-23 MED ORDER — REGADENOSON 0.4 MG/5ML IV SOLN: 0.4000 mg | Freq: Once | INTRAVENOUS | Status: DC

## 2022-06-23 MED ORDER — TECHNETIUM TC 99M TETROFOSMIN IV KIT
30.0000 | PACK | Freq: Once | INTRAVENOUS | Status: AC | PRN
  Administered 2022-06-23: 30.22 via INTRAVENOUS

## 2022-06-23 MED ORDER — REGADENOSON 0.4 MG/5ML IV SOLN
0.4000 mg | Freq: Once | INTRAVENOUS | Status: AC
  Administered 2022-06-23: 0.4 mg via INTRAVENOUS

## 2022-06-24 ENCOUNTER — Encounter
Admission: RE | Admit: 2022-06-24 | Discharge: 2022-06-24 | Disposition: A | Discharge status: Home / Self Care | Payer: Medicare Other | Source: Ambulatory Visit | Attending: Student | Admitting: Student

## 2022-06-24 MED ORDER — TECHNETIUM TC 99M TETROFOSMIN IV KIT
29.0200 | PACK | Freq: Once | INTRAVENOUS | Status: AC | PRN
  Administered 2022-06-24: 29.02 via INTRAVENOUS

## 2022-06-27 LAB — NM MYOCAR MULTI W/SPECT W/WALL MOTION / EF
Base ST Depression (mm): 0 mm
Estimated workload: 1
Exercise duration (min): 1 min
LV dias vol: 170 mL (ref 62–150)
LV sys vol: 90 mL
MPHR: 138 {beats}/min
Nuc Stress EF: 47 %
Peak HR: 64 {beats}/min
Percent HR: 64 %
Rest HR: 64 {beats}/min
Rest Nuclear Isotope Dose: 29 mCi
SDS: 8
SRS: 7
SSS: 11
ST Depression (mm): 0 mm
Stress Nuclear Isotope Dose: 30.2 mCi
TID: 0.91

## 2022-07-14 ENCOUNTER — Ambulatory Visit
Admission: RE | Admit: 2022-07-14 | Discharge: 2022-07-14 | Disposition: A | Discharge status: Home / Self Care | Payer: Medicare Other | Source: Ambulatory Visit | Attending: Student | Admitting: Student

## 2022-07-14 DIAGNOSIS — Z87891 Personal history of nicotine dependence: Secondary | ICD-10-CM | POA: Diagnosis not present

## 2022-07-14 DIAGNOSIS — I340 Nonrheumatic mitral (valve) insufficiency: Secondary | ICD-10-CM | POA: Diagnosis not present

## 2022-07-14 DIAGNOSIS — I4892 Unspecified atrial flutter: Secondary | ICD-10-CM | POA: Insufficient documentation

## 2022-07-14 DIAGNOSIS — R011 Cardiac murmur, unspecified: Secondary | ICD-10-CM | POA: Insufficient documentation

## 2022-07-14 DIAGNOSIS — R0600 Dyspnea, unspecified: Secondary | ICD-10-CM | POA: Diagnosis not present

## 2022-07-14 DIAGNOSIS — I119 Hypertensive heart disease without heart failure: Secondary | ICD-10-CM | POA: Insufficient documentation

## 2022-07-14 DIAGNOSIS — Z950 Presence of cardiac pacemaker: Secondary | ICD-10-CM | POA: Insufficient documentation

## 2022-07-14 LAB — ECHOCARDIOGRAM COMPLETE
AR max vel: 1.44 cm2
AV Area VTI: 1.4 cm2
AV Area mean vel: 1.27 cm2
AV Mean grad: 27.5 mmHg
AV Peak grad: 50 mmHg
Ao pk vel: 3.54 m/s
Area-P 1/2: 4.01 cm2
MV VTI: 3.01 cm2
S' Lateral: 4.6 cm

## 2022-07-14 NOTE — Progress Notes (Signed)
*  PRELIMINARY RESULTS* Echocardiogram 2D Echocardiogram has been performed.  Walter Knox 07/14/2022, 10:11 AM

## 2022-08-31 ENCOUNTER — Encounter: Payer: Self-pay | Admitting: Urology

## 2022-08-31 ENCOUNTER — Ambulatory Visit: Payer: Medicare Other | Admitting: Urology

## 2022-08-31 VITALS — BP 117/78 | HR 81 | Ht 70.0 in | Wt 289.0 lb

## 2022-08-31 DIAGNOSIS — R310 Gross hematuria: Secondary | ICD-10-CM

## 2022-08-31 LAB — URINALYSIS, COMPLETE
Bilirubin, UA: NEGATIVE
Glucose, UA: NEGATIVE
Ketones, UA: NEGATIVE
Leukocytes,UA: NEGATIVE
Nitrite, UA: NEGATIVE
Protein,UA: NEGATIVE
Specific Gravity, UA: 1.01 (ref 1.005–1.030)
Urobilinogen, Ur: 1 mg/dL (ref 0.2–1.0)
pH, UA: 6.5 (ref 5.0–7.5)

## 2022-08-31 LAB — MICROSCOPIC EXAMINATION

## 2022-08-31 NOTE — Progress Notes (Signed)
I, Duke Salvia, acting as a scribe for Riki Altes, MD., have documented all relevant documentation on the behalf of Riki Altes, MD, as directed by  Riki Altes, MD while in the presence of Riki Altes, MD.   08/31/2022 1:56 PM   Melany Guernsey 03-05-1941 [id number]  Referring provider: Marisue Ivan, MD [phone] [facility] [address] [facility] [place],  [place] [address]  Chief Complaint  Patient presents with   Hematuria    HPI: Walter Knox is a 82 y.o. male presenting for evaluation of microhematuria.  Urinalysis 08/16/2022 showed 56 RBCs on microscopy; a repeat UA 08/18/2022 showed 11 RBCs on microscopy. Urine culture was negative. Started urethral spotting in mid-April with mild initial dysuria. One episode of total gross painless hematuria and 3-4 episodes of noticing small clots in the urine. No bothersome LUTS. A prior history transurethral microwave thermotherapy by Dr. Evelene Croon years ago.  On chronic anticoagulant therapy with Eloquis.    PMH: Past Medical History:  Diagnosis Date   Anginal pain ([facility])    BPH (benign prostatic hyperplasia)    CAD (coronary artery disease)    Cancer ([facility])    Chronic back pain    Dyspnea    Dysrhythmia    Environmental and seasonal allergies    Hyperlipidemia    Hypertension    Lower extremity edema    Morbid obesity ([facility])    Pre-diabetes    PVD (peripheral vascular disease) ([facility])    SSS (sick sinus syndrome) ([facility])     Surgical History: Past Surgical History:  Procedure Laterality Date   CARDIOVERSION N/A 06/02/2022   Procedure: CARDIOVERSION;  Surgeon: Marcina Millard, MD;  Location: [facility] ORS;  Service: Cardiovascular;  Laterality: N/A;   COLONOSCOPY     COLONOSCOPY WITH ESOPHAGOGASTRODUODENOSCOPY (EGD)     COLONOSCOPY WITH PROPOFOL N/A 03/31/2017   Procedure: COLONOSCOPY WITH PROPOFOL;  Surgeon: Christena Deem, MD;  Location: [facility] ENDOSCOPY;  Service: Endoscopy;  Laterality: N/A;    COLONOSCOPY WITH PROPOFOL N/A 08/09/2017   Procedure: COLONOSCOPY WITH PROPOFOL;  Surgeon: Toledo, Boykin Nearing, MD;  Location: [facility] ENDOSCOPY;  Service: Gastroenterology;  Laterality: N/A;   CORONARY ANGIOPLASTY WITH STENT PLACEMENT     JOINT REPLACEMENT Bilateral 2006 2011   KNEE SURGERY     PACEMAKER INSERTION N/A 05/03/2017   Procedure: INSERTION PACEMAKER-DUAL CHAMBER INITIAL INSERT;  Surgeon: Marcina Millard, MD;  Location: [facility] ORS;  Service: Cardiovascular;  Laterality: N/A;   Pilondial Cyst Removal     TONSILLECTOMY     Total Right Knee Replacement      Home Medications:  Allergies as of 08/31/2022   No Known Allergies      Medication List        Accurate as of Aug 31, 2022  1:56 PM. If you have any questions, ask your nurse or doctor.          STOP taking these medications    amLODipine 10 MG tablet Commonly known as: NORVASC Stopped by: Riki Altes, MD   celecoxib 200 MG capsule Commonly known as: CELEBREX Stopped by: Riki Altes, MD   cephALEXin 250 MG capsule Commonly known as: Keflex Stopped by: Riki Altes, MD   furosemide 20 MG tablet Commonly known as: Lasix Stopped by: Riki Altes, MD       TAKE these medications    acetaminophen 650 MG CR tablet Commonly known as: TYLENOL Take 650 mg by mouth 2 (two) times daily.  aspirin EC 81 MG tablet Take 81 mg by mouth daily.   cetirizine 10 MG tablet Commonly known as: ZYRTEC Take 10 mg by mouth daily.   Eliquis 5 MG Tabs tablet Generic drug: apixaban Take 5 mg by mouth 2 (two) times daily.   Fish Oil 1000 MG Caps Take 1,000 mg by mouth 2 (two) times daily.   hydrochlorothiazide 25 MG tablet Commonly known as: HYDRODIURIL Take 25 mg by mouth daily.   loratadine 10 MG tablet Commonly known as: CLARITIN Take 10 mg by mouth daily.   losartan 100 MG tablet Commonly known as: COZAAR Take 100 mg by mouth daily.   metFORMIN 500 MG 24 hr tablet Commonly known as:  GLUCOPHAGE-XR Take 500 mg by mouth daily.   metoprolol tartrate 100 MG tablet Commonly known as: LOPRESSOR Take 100 mg by mouth 2 (two) times daily.   multivitamin tablet Take 1 tablet by mouth daily.   niacin 1000 MG CR tablet Commonly known as: NIASPAN Take 1,000 mg by mouth at bedtime.   omeprazole 20 MG capsule Commonly known as: PRILOSEC Take 20 mg by mouth at bedtime.   Ozempic (0.25 or 0.5 MG/DOSE) 2 MG/3ML Sopn Generic drug: Semaglutide(0.25 or 0.5MG /DOS) SMARTSIG:0.25 Milligram(s) SUB-Q Once a Week   polycarbophil 625 MG tablet Commonly known as: FIBERCON Take 1,250 mg by mouth 2 (two) times daily.   simvastatin 40 MG tablet Commonly known as: ZOCOR Take 20 mg by mouth at bedtime.   torsemide 20 MG tablet Commonly known as: DEMADEX Take by mouth.        Social History:  reports that he quit smoking about 20 years ago. His smoking use included cigarettes. He has a 80.00 pack-year smoking history. He has never used smokeless tobacco. He reports current alcohol use of about 3.0 standard drinks of alcohol per week. He reports that he does not use drugs.   Physical Exam: BP 117/78   Pulse 81   Ht 5\' 10"  (1.778 m)   Wt 289 lb (131.1 kg)   BMI 41.47 kg/m   Constitutional:  Alert and oriented, No acute distress. HEENT: [place] AT, moist mucus membranes.  Trachea midline, no masses. Cardiovascular: No clubbing, cyanosis, or edema. Respiratory: Normal respiratory effort, no increased work of breathing. GI: Abdomen is soft, nontender, nondistended, no abdominal masses Skin: No rashes, bruises or suspicious lesions. Neurologic: Grossly intact, no focal deficits, moving all 4 extremities. Psychiatric: Normal mood and affect.  Laboratory Data:  Urinalysis Dipstick 2+blood/microscopy 11-30 RBC.   Assessment & Plan:    1.  Gross hematuria AUA risk stratification: High We discussed the recommended evaluation of high risk hematuria which consist of CT urogram and  cystoscopy.  The procedures were discussed in detail and he/she has elected to proceed with further evaluation All questions were answered CTU order placed and cystoscopy was scheduled      [facility] [address], [address] [place], [place] [address] [phone]

## 2022-09-13 ENCOUNTER — Ambulatory Visit
Admission: RE | Admit: 2022-09-13 | Discharge: 2022-09-13 | Disposition: A | Discharge status: Home / Self Care | Payer: Medicare Other | Source: Ambulatory Visit | Attending: Urology | Admitting: Urology

## 2022-09-13 DIAGNOSIS — R310 Gross hematuria: Secondary | ICD-10-CM

## 2022-09-13 MED ORDER — IOHEXOL 300 MG/ML  SOLN
100.0000 mL | Freq: Once | INTRAMUSCULAR | Status: AC | PRN
  Administered 2022-09-13: 100 mL via INTRAVENOUS

## 2022-10-03 ENCOUNTER — Encounter: Payer: Self-pay | Admitting: Urology

## 2022-10-03 ENCOUNTER — Ambulatory Visit: Payer: Medicare Other | Admitting: Urology

## 2022-10-03 VITALS — BP 136/82 | HR 80 | Ht 70.0 in | Wt 281.0 lb

## 2022-10-03 DIAGNOSIS — R3129 Other microscopic hematuria: Secondary | ICD-10-CM | POA: Diagnosis not present

## 2022-10-03 LAB — URINALYSIS, COMPLETE
Bilirubin, UA: NEGATIVE
Glucose, UA: NEGATIVE
Ketones, UA: NEGATIVE
Leukocytes,UA: NEGATIVE
Nitrite, UA: NEGATIVE
Protein,UA: NEGATIVE
RBC, UA: NEGATIVE
Specific Gravity, UA: 1.015 (ref 1.005–1.030)
Urobilinogen, Ur: 1 mg/dL (ref 0.2–1.0)
pH, UA: 7 (ref 5.0–7.5)

## 2022-10-03 LAB — MICROSCOPIC EXAMINATION

## 2022-10-03 NOTE — Progress Notes (Signed)
   10/03/22  CC:  Chief Complaint  Patient presents with   Cysto    HPI: Refer to my prior office note 08/31/2022.  CTU performed 09/13/2022 showed a 9 cm right upper pole simple and smaller simple cysts.  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  Prostate volume calculated at 77 cc.  Incidentally noted to have cholelithiasis Urinalysis today without RBCs  Blood pressure 136/82, pulse 80, height 5\' 10"  (1.778 m), weight 281 lb (127.5 kg). NED. A&Ox3.   No respiratory distress   Abd soft, NT, ND Normal phallus with bilateral descended testicles  Cystoscopy Procedure Note  Patient identification was confirmed, informed consent was obtained, and patient was prepped using Betadine solution.  Lidocaine jelly was administered per urethral meatus.     Pre-Procedure: - Inspection reveals a normal caliber urethral meatus.  Procedure: The flexible cystoscope was introduced without difficulty - No urethral strictures/lesions are present. -Prominent lateral lobe enlargement prostate with hypervascular/friability - Elevated bladder neck - Bilateral ureteral orifices identified - Bladder mucosa  reveals no ulcers, tumors, or lesions - No bladder stones -Moderate trabeculation  Retroflexion shows backbleeding from prostate   Post-Procedure: - Patient tolerated the procedure well  Assessment/ Plan: Prominent BPH which is the most likely etiology of his microhematuria No bothersome lower urinary tract symptoms 1 year follow-up with Onalee Hua, MD

## 2023-01-16 ENCOUNTER — Observation Stay
Admission: EM | Admit: 2023-01-16 | Discharge: 2023-01-17 | Disposition: A | Discharge status: Home / Self Care | Payer: Medicare Other | Attending: Internal Medicine | Admitting: Internal Medicine

## 2023-01-16 ENCOUNTER — Emergency Department: Payer: Medicare Other

## 2023-01-16 ENCOUNTER — Other Ambulatory Visit: Payer: Self-pay

## 2023-01-16 DIAGNOSIS — K529 Noninfective gastroenteritis and colitis, unspecified: Secondary | ICD-10-CM | POA: Diagnosis not present

## 2023-01-16 DIAGNOSIS — N179 Acute kidney failure, unspecified: Principal | ICD-10-CM

## 2023-01-16 DIAGNOSIS — E118 Type 2 diabetes mellitus with unspecified complications: Secondary | ICD-10-CM

## 2023-01-16 DIAGNOSIS — I10 Essential (primary) hypertension: Secondary | ICD-10-CM

## 2023-01-16 DIAGNOSIS — I480 Paroxysmal atrial fibrillation: Secondary | ICD-10-CM | POA: Insufficient documentation

## 2023-01-16 DIAGNOSIS — K5730 Diverticulosis of large intestine without perforation or abscess without bleeding: Secondary | ICD-10-CM | POA: Diagnosis not present

## 2023-01-16 DIAGNOSIS — R10819 Abdominal tenderness, unspecified site: Secondary | ICD-10-CM | POA: Diagnosis present

## 2023-01-16 DIAGNOSIS — N189 Chronic kidney disease, unspecified: Secondary | ICD-10-CM | POA: Insufficient documentation

## 2023-01-16 DIAGNOSIS — E785 Hyperlipidemia, unspecified: Secondary | ICD-10-CM | POA: Insufficient documentation

## 2023-01-16 LAB — CBC
HCT: 51.4 % (ref 39.0–52.0)
Hemoglobin: 17.4 g/dL — ABNORMAL HIGH (ref 13.0–17.0)
MCH: 30.4 pg (ref 26.0–34.0)
MCHC: 33.9 g/dL (ref 30.0–36.0)
MCV: 89.7 fL (ref 80.0–100.0)
Platelets: 265 10*3/uL (ref 150–400)
RBC: 5.73 MIL/uL (ref 4.22–5.81)
RDW: 13.2 % (ref 11.5–15.5)
WBC: 13.6 10*3/uL — ABNORMAL HIGH (ref 4.0–10.5)
nRBC: 0 % (ref 0.0–0.2)

## 2023-01-16 LAB — COMPREHENSIVE METABOLIC PANEL
ALT: 24 U/L (ref 0–44)
AST: 23 U/L (ref 15–41)
Albumin: 4.3 g/dL (ref 3.5–5.0)
Alkaline Phosphatase: 64 U/L (ref 38–126)
Anion gap: 12 (ref 5–15)
BUN: 38 mg/dL — ABNORMAL HIGH (ref 8–23)
CO2: 28 mmol/L (ref 22–32)
Calcium: 9.6 mg/dL (ref 8.9–10.3)
Chloride: 97 mmol/L — ABNORMAL LOW (ref 98–111)
Creatinine, Ser: 2.19 mg/dL — ABNORMAL HIGH (ref 0.61–1.24)
GFR, Estimated: 29 mL/min — ABNORMAL LOW (ref >60)
Glucose, Bld: 130 mg/dL — ABNORMAL HIGH (ref 70–99)
Potassium: 4.5 mmol/L (ref 3.5–5.1)
Sodium: 137 mmol/L (ref 135–145)
Total Bilirubin: 1.5 mg/dL — ABNORMAL HIGH (ref 0.3–1.2)
Total Protein: 7.9 g/dL (ref 6.5–8.1)

## 2023-01-16 LAB — GASTROINTESTINAL PANEL BY PCR, STOOL (REPLACES STOOL CULTURE)

## 2023-01-16 LAB — C DIFFICILE QUICK SCREEN W PCR REFLEX
C Diff antigen: NEGATIVE
C Diff interpretation: NOT DETECTED
C Diff toxin: NEGATIVE

## 2023-01-16 LAB — LIPASE, BLOOD: Lipase: 35 U/L (ref 11–51)

## 2023-01-16 MED ORDER — LOVASTATIN ER 20 MG PO TB24: 20.0000 mg | ORAL_TABLET | Freq: Every day | ORAL | Status: DC

## 2023-01-16 MED ORDER — ONDANSETRON HCL 4 MG/2ML IJ SOLN: 4.0000 mg | Freq: Four times a day (QID) | INTRAMUSCULAR | Status: DC | PRN

## 2023-01-16 MED ORDER — AMIODARONE HCL 200 MG PO TABS
200.0000 mg | ORAL_TABLET | Freq: Every day | ORAL | Status: DC
  Administered 2023-01-17: 200 mg via ORAL
  Filled 2023-01-16: qty 1

## 2023-01-16 MED ORDER — APIXABAN 5 MG PO TABS
5.0000 mg | ORAL_TABLET | Freq: Two times a day (BID) | ORAL | Status: DC
  Administered 2023-01-17 (×2): 5 mg via ORAL
  Filled 2023-01-16 (×2): qty 1

## 2023-01-16 MED ORDER — ONDANSETRON HCL 4 MG PO TABS: 4.0000 mg | ORAL_TABLET | Freq: Four times a day (QID) | ORAL | Status: DC | PRN

## 2023-01-16 MED ORDER — ONDANSETRON HCL 4 MG/2ML IJ SOLN
4.0000 mg | Freq: Once | INTRAMUSCULAR | Status: AC
  Administered 2023-01-16: 4 mg via INTRAVENOUS
  Filled 2023-01-16: qty 2

## 2023-01-16 MED ORDER — PANTOPRAZOLE SODIUM 40 MG IV SOLR
40.0000 mg | Freq: Two times a day (BID) | INTRAVENOUS | Status: DC
  Administered 2023-01-16 – 2023-01-17 (×2): 40 mg via INTRAVENOUS
  Filled 2023-01-16 (×2): qty 10

## 2023-01-16 MED ORDER — SODIUM CHLORIDE 0.9 % IV BOLUS
1000.0000 mL | Freq: Once | INTRAVENOUS | Status: AC
  Administered 2023-01-16: 1000 mL via INTRAVENOUS

## 2023-01-16 MED ORDER — METOPROLOL TARTRATE 50 MG PO TABS
100.0000 mg | ORAL_TABLET | Freq: Two times a day (BID) | ORAL | Status: DC
  Administered 2023-01-17: 100 mg via ORAL
  Filled 2023-01-16 (×2): qty 2

## 2023-01-16 MED ORDER — MAGNESIUM HYDROXIDE 400 MG/5ML PO SUSP: 30.0000 mL | Freq: Every day | ORAL | Status: DC | PRN

## 2023-01-16 MED ORDER — TRAZODONE HCL 50 MG PO TABS
25.0000 mg | ORAL_TABLET | Freq: Every evening | ORAL | Status: DC | PRN
  Administered 2023-01-16: 25 mg via ORAL
  Filled 2023-01-16: qty 1

## 2023-01-16 MED ORDER — PRAVASTATIN SODIUM 20 MG PO TABS
20.0000 mg | ORAL_TABLET | Freq: Every day | ORAL | Status: DC
  Administered 2023-01-16: 20 mg via ORAL
  Filled 2023-01-16: qty 1

## 2023-01-16 MED ORDER — ACETAMINOPHEN 325 MG PO TABS: 650.0000 mg | ORAL_TABLET | Freq: Four times a day (QID) | ORAL | Status: DC | PRN

## 2023-01-16 MED ORDER — SODIUM CHLORIDE 0.9 % IV SOLN: INTRAVENOUS | Status: DC

## 2023-01-16 MED ORDER — EZETIMIBE 10 MG PO TABS
10.0000 mg | ORAL_TABLET | Freq: Every day | ORAL | Status: DC
  Administered 2023-01-17 (×2): 10 mg via ORAL
  Filled 2023-01-16 (×2): qty 1

## 2023-01-16 MED ORDER — ADULT MULTIVITAMIN W/MINERALS CH
1.0000 | ORAL_TABLET | Freq: Every day | ORAL | Status: DC
  Administered 2023-01-17: 1 via ORAL
  Filled 2023-01-16: qty 1

## 2023-01-16 MED ORDER — ACETAMINOPHEN 325 MG RE SUPP: 650.0000 mg | Freq: Four times a day (QID) | RECTAL | Status: DC | PRN

## 2023-01-16 NOTE — Assessment & Plan Note (Signed)
-  We will place the patient on supplemental coverage with NovoLog.

## 2023-01-16 NOTE — ED Notes (Signed)
Pt requested a sleep aide.

## 2023-01-16 NOTE — Assessment & Plan Note (Signed)
-   The patient will be admitted to a medical observation bed. - We will continue addition of the normal saline. - Will follow GI panel that was ordered. - We will utilize as needed Imodium as needed with negative C. difficile.

## 2023-01-16 NOTE — Assessment & Plan Note (Signed)
-   Will continue Zetia and statin therapy.

## 2023-01-16 NOTE — ED Notes (Signed)
Pt ambulated to restroom and back to bed safely.

## 2023-01-16 NOTE — Assessment & Plan Note (Addendum)
-   We will continue amiodarone and Eliquis. - Will check an EKG.

## 2023-01-16 NOTE — Assessment & Plan Note (Signed)
-   We will continue Lopressor and hold off diuretic therapy and ARB given acute kidney injury.

## 2023-01-16 NOTE — H&P (Signed)
[place]   PATIENT NAME: Walter Knox    MR#:  [id number]  DATE OF BIRTH:  07-31-1940  DATE OF ADMISSION:  01/16/2023  PRIMARY CARE PHYSICIAN: Marisue Ivan, MD   Patient is coming from: Home  REQUESTING/REFERRING PHYSICIAN: Artis Delay, MD  CHIEF COMPLAINT:   Chief Complaint  Patient presents with   Abdominal Pain   Emesis    HISTORY OF PRESENT ILLNESS:  Walter Knox is a 82 y.o. male with medical history significant for coronary artery disease, essential hypertension, dyslipidemia, prediabetes, peripheral vascular disease and SSS s/p PPM, presented to the emergency room with acute onset of   intractable nausea, vomiting and diarrhea that started on Saturday with associated abdominal tenderness mostly in the lower abdomen.  Is been taking Imodium for watery bowel movements which has been helping but continues to have vomiting.  He reported he felt like his vomitus was dark as if that had stools.  No fever or chills.  No melena or bright red bleeding per rectum.  No chest pain or palpitations.  No cough or wheezing or dyspnea.  No dysuria, liquidy or hematuria or flank pain.  ED Course: Upon presentation to the emergency room, BP was 103/91 with otherwise normal vital signs.  Labs revealed mild hypoanemia of 97 and a BUN of 38 with creatinine 2.19 up from 1.3 in January 2023.  Total bili was 1.5 with otherwise unremarkable CMP.  CBC showed leukocytosis 13.6.  Abdominal pelvic CT scan without contrast revealed the following EKG  : None  Imaging: Abdominal pelvic CT scan revealed the following: 1. The small bowel and colon are fluid-filled to the rectum, consistent with diarrheal illness. No evidence of bowel obstruction or inflammation. 2. Sigmoid diverticulosis without evidence of acute diverticulitis. 3. Cholelithiasis. 4. Prostatomegaly. 5. Coronary artery disease. 6.  Aortic atherosclerosis.   The patient was given 1 L bolus of IV normal saline and 4 mg of  IV Zofran.  He will be admitted to a medical observation bed for further evaluation and management.Marland Kitchen  PAST MEDICAL HISTORY:   Past Medical History:  Diagnosis Date   Anginal pain (HCC)    BPH (benign prostatic hyperplasia)    CAD (coronary artery disease)    Cancer (HCC)    Chronic back pain    Dyspnea    Dysrhythmia    Environmental and seasonal allergies    Hyperlipidemia    Hypertension    Lower extremity edema    Morbid obesity (HCC)    Pre-diabetes    PVD (peripheral vascular disease) (HCC)    SSS (sick sinus syndrome) (HCC)     PAST SURGICAL HISTORY:   Past Surgical History:  Procedure Laterality Date   CARDIOVERSION N/A 06/02/2022   Procedure: CARDIOVERSION;  Surgeon: Marcina Millard, MD;  Location: [facility] ORS;  Service: Cardiovascular;  Laterality: N/A;   COLONOSCOPY     COLONOSCOPY WITH ESOPHAGOGASTRODUODENOSCOPY (EGD)     COLONOSCOPY WITH PROPOFOL N/A 03/31/2017   Procedure: COLONOSCOPY WITH PROPOFOL;  Surgeon: Christena Deem, MD;  Location: [facility] ENDOSCOPY;  Service: Endoscopy;  Laterality: N/A;   COLONOSCOPY WITH PROPOFOL N/A 08/09/2017   Procedure: COLONOSCOPY WITH PROPOFOL;  Surgeon: Toledo, Boykin Nearing, MD;  Location: [facility] ENDOSCOPY;  Service: Gastroenterology;  Laterality: N/A;   CORONARY ANGIOPLASTY WITH STENT PLACEMENT     JOINT REPLACEMENT Bilateral 2006 2011   KNEE SURGERY     PACEMAKER INSERTION N/A 05/03/2017   Procedure: INSERTION PACEMAKER-DUAL CHAMBER INITIAL INSERT;  Surgeon: Darrold Junker,  Lyn Hollingshead, MD;  Location: [facility] ORS;  Service: Cardiovascular;  Laterality: N/A;   Pilondial Cyst Removal     TONSILLECTOMY     Total Right Knee Replacement      SOCIAL HISTORY:   Social History   Tobacco Use   Smoking status: Former    Current packs/day: 0.00    Average packs/day: 2.0 packs/day for 40.0 years (80.0 ttl pk-yrs)    Types: Cigarettes    Start date: 10/09/1961    Quit date: 10/09/2001    Years since quitting: 21.2   Smokeless tobacco: Never   Substance Use Topics   Alcohol use: Yes    Alcohol/week: 3.0 standard drinks of alcohol    Types: 1 Glasses of wine, 1 Cans of beer, 1 Shots of liquor per week    FAMILY HISTORY:  History reviewed. No pertinent family history.  DRUG ALLERGIES:  No Known Allergies  REVIEW OF SYSTEMS:   ROS As per history of present illness. All pertinent systems were reviewed above. Constitutional, HEENT, cardiovascular, respiratory, GI, GU, musculoskeletal, neuro, psychiatric, endocrine, integumentary and hematologic systems were reviewed and are otherwise negative/unremarkable except for positive findings mentioned above in the HPI.   MEDICATIONS AT HOME:   Prior to Admission medications   Medication Sig Start Date End Date Taking? Authorizing Provider  acetaminophen (TYLENOL) 650 MG CR tablet Take 650 mg by mouth 2 (two) times daily.    [provider]  amiodarone (PACERONE) 200 MG tablet Take 200 mg by mouth daily.    [provider]  ELIQUIS 5 MG TABS tablet Take 5 mg by mouth 2 (two) times daily.    [provider]  ezetimibe (ZETIA) 10 MG tablet Take 10 mg by mouth daily.    [provider]  hydrochlorothiazide (HYDRODIURIL) 25 MG tablet Take 25 mg by mouth daily.    [provider]  lovastatin (ALTOPREV) 20 MG 24 hr tablet Take 20 mg by mouth at bedtime.    [provider]  metFORMIN (GLUCOPHAGE-XR) 500 MG 24 hr tablet Take 500 mg by mouth daily.    [provider]  metoprolol tartrate (LOPRESSOR) 100 MG tablet Take 100 mg by mouth 2 (two) times daily.    [provider]  Multiple Vitamin (MULTIVITAMIN) tablet Take 1 tablet by mouth daily.    [provider]  omeprazole (PRILOSEC) 20 MG capsule Take 20 mg by mouth at bedtime.     [provider]  OZEMPIC, 0.25 OR 0.5 MG/DOSE, 2 MG/3ML SOPN SMARTSIG:0.25 Milligram(s) SUB-Q Once a Week    [provider]  polycarbophil (FIBERCON) 625 MG tablet  Take 1,250 mg by mouth 2 (two) times daily.     [provider]  torsemide (DEMADEX) 20 MG tablet Take by mouth. 07/25/22 07/25/23  [provider]      VITAL SIGNS:  Blood pressure 104/67, pulse 80, temperature 98 F (36.7 C), resp. rate 16, height 5' 9.5" (1.765 m), weight 122.5 kg, SpO2 96%.  PHYSICAL EXAMINATION:  Physical Exam  GENERAL:  82 y.o.-year-old patient lying in the bed with no acute distress.  EYES: Pupils equal, round, reactive to light and accommodation. No scleral icterus. Extraocular muscles intact.  HEENT: Head atraumatic, normocephalic. Oropharynx and nasopharynx clear.  NECK:  Supple, no jugular venous distention. No thyroid enlargement, no tenderness.  LUNGS: Normal breath sounds bilaterally, no wheezing, rales,rhonchi or crepitation. No use of accessory muscles of respiration.  CARDIOVASCULAR: Regular rate and rhythm, S1, S2 normal. No murmurs, rubs, or  gallops.  ABDOMEN: Soft, nondistended, nontender. Bowel sounds present. No organomegaly or mass.  EXTREMITIES: No pedal edema, cyanosis, or clubbing.  NEUROLOGIC: Cranial nerves II through XII are intact. Muscle strength 5/5 in all extremities. Sensation intact. Gait not checked.  PSYCHIATRIC: The patient is alert and oriented x 3.  Normal affect and good eye contact. SKIN: No obvious rash, lesion, or ulcer.   LABORATORY PANEL:   CBC Recent Labs  Lab 01/16/23 1646  WBC 13.6*  HGB 17.4*  HCT 51.4  PLT 265   ------------------------------------------------------------------------------------------------------------------  Chemistries  Recent Labs  Lab 01/16/23 1646  NA 137  K 4.5  CL 97*  CO2 28  GLUCOSE 130*  BUN 38*  CREATININE 2.19*  CALCIUM 9.6  AST 23  ALT 24  ALKPHOS 64  BILITOT 1.5*   ------------------------------------------------------------------------------------------------------------------  Cardiac Enzymes No results for input(s): "TROPONINI" in the last 168  hours. ------------------------------------------------------------------------------------------------------------------  RADIOLOGY:  CT ABDOMEN PELVIS WO CONTRAST  Result Date: 01/16/2023 CLINICAL DATA:  Abdominal pain, diarrhea, emesis EXAM: CT ABDOMEN AND PELVIS WITHOUT CONTRAST TECHNIQUE: Multidetector CT imaging of the abdomen and pelvis was performed following the standard protocol without IV contrast. RADIATION DOSE REDUCTION: This exam was performed according to the departmental dose-optimization program which includes automated exposure control, adjustment of the mA and/or kV according to patient size and/or use of iterative reconstruction technique. COMPARISON:  09/13/2022 FINDINGS: Lower chest: No acute abnormality.  Coronary artery calcifications. Hepatobiliary: No solid liver abnormality is seen. Multiple unchanged cysts throughout the liver. Gallstone. No gallbladder wall thickening, or biliary dilatation. Pancreas: Unremarkable. No pancreatic ductal dilatation or surrounding inflammatory changes. Spleen: Normal in size without significant abnormality. Adrenals/Urinary Tract: Adrenal glands are unremarkable. Simple, benign right renal cortical cysts, for which no further follow-up or characterization is required. Kidneys are otherwise normal, without renal calculi, solid lesion, or hydronephrosis. Bladder is unremarkable. Stomach/Bowel: Stomach is within normal limits. Appendix appears normal. No evidence of bowel wall thickening, distention, or inflammatory changes. Sigmoid diverticulosis. The small bowel and colon are fluid-filled to the rectum. Incidental note of a small benign mural lipoma of the ascending colon, for which no further follow-up or characterization is required (series 2, image 69). Vascular/Lymphatic: Aortic atherosclerosis. No enlarged abdominal or pelvic lymph nodes. Reproductive: Prostatomegaly. Other: No abdominal wall hernia or abnormality. No ascites. Musculoskeletal: No  acute or significant osseous findings. IMPRESSION: 1. The small bowel and colon are fluid-filled to the rectum, consistent with diarrheal illness. No evidence of bowel obstruction or inflammation. 2. Sigmoid diverticulosis without evidence of acute diverticulitis. 3. Cholelithiasis. 4. Prostatomegaly. 5. Coronary artery disease. Aortic Atherosclerosis (ICD10-I70.0). Electronically Signed   By: Jearld Lesch M.D.   On: 01/16/2023 21:02      IMPRESSION AND PLAN:  Assessment and Plan: * Acute gastroenteritis - The patient will be admitted to a medical observation bed. - We will continue addition of the normal saline. - Will follow GI panel that was ordered. - We will utilize as needed Imodium as needed with negative C. difficile.  AKI (acute kidney injury) (HCC) - Will continue hydration with IV normal saline. - We will avoid nephrotoxins. - Will follow BMPs.  Type 2 diabetes mellitus with complication, without long-term current use of insulin (HCC) - We will place the patient on supplemental coverage with NovoLog.  Essential hypertension - We will continue Lopressor and hold off diuretic therapy and ARB given acute kidney injury.  Dyslipidemia - Will continue Zetia and statin therapy.  Paroxysmal atrial fibrillation (HCC) - We  will continue amiodarone and Eliquis. - Will check an EKG.   DVT prophylaxis: Lovenox.  Advanced Care Planning:  Code Status: full code.  Family Communication:  The plan of care was discussed in details with the patient (and family). I answered all questions. The patient agreed to proceed with the above mentioned plan. Further management will depend upon hospital course. Disposition Plan: Back to previous home environment Consults called: none.  All the records are reviewed and case discussed with ED provider.  Status is: Observation  I certify that at the time of admission, it is my clinical judgment that the patient will require hospital care extending  less than 2 midnights.                            Dispo: The patient is from: Home              Anticipated d/c is to: Home              Patient currently is not medically stable to d/c.              Difficult to place patient: No  Hannah Beat M.D on 01/16/2023 at 9:57 PM  [facility]   From 7 PM-7 AM, contact night-coverage [email or web address]  CC: Primary care physician; Marisue Ivan, MD

## 2023-01-16 NOTE — Assessment & Plan Note (Signed)
-   Will continue hydration with IV normal saline. - We will avoid nephrotoxins. - Will follow BMPs.

## 2023-01-16 NOTE — ED Triage Notes (Signed)
Pt to ED for diarrhea and emesis started Saturday. Reports emesis looked like feces. Reports abd tender

## 2023-01-16 NOTE — ED Notes (Signed)
Assumed care of pt at this time. Pt is AAXO4, VS stable and WNL. Pt requested diet cola, provided. Pt changed into gown. Call light within reach, side rails up x2. No needs identified at this time.

## 2023-01-16 NOTE — ED Provider Notes (Signed)
[facility] Provider Note    Event Date/Time   First MD Initiated Contact with Patient 01/16/23 1941     (approximate)   History   Abdominal Pain and Emesis   HPI  Walter Knox is a 82 y.o. 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  Also reports some abdominal tenderness.  Reports mostly lower abdominal tenderness has been going on for the past few days.  He reports having some liquidy stools and taking Imodium and that seemed to help but now he is been having some vomiting.  He reports feeling like his stool is in his vomit.  He denies this ever happening previously.  No prior abdominal surgeries.  He denies any chest pain or shortness of breath.  He reports most of his pain is in the lower abdomen.  He denies any issues with urination.   Physical Exam   Triage Vital Signs: ED Triage Vitals  Encounter Vitals Group     BP 01/16/23 1648 (!) 103/91     Systolic BP Percentile --      Diastolic BP Percentile --      Pulse Rate 01/16/23 1646 74     Resp 01/16/23 1646 20     Temp 01/16/23 1646 98.3 F (36.8 C)     Temp src --      SpO2 01/16/23 1646 96 %     Weight 01/16/23 1647 270 lb (122.5 kg)     Height 01/16/23 1647 5' 9.5" (1.765 m)     Head Circumference --      Peak Flow --      Pain Score 01/16/23 1647 2     Pain Loc --      Pain Education --      Exclude from Growth Chart --     Most recent vital signs: Vitals:   01/16/23 1646 01/16/23 1648  BP:  (!) 103/91  Pulse: 74   Resp: 20   Temp: 98.3 F (36.8 C)   SpO2: 96%      General: Awake, no distress.  CV:  Good peripheral perfusion.  Resp:  Normal effort.  Abd:  No distention. Tender lower abdomen  Other:     ED Results / Procedures / Treatments   Labs (all labs ordered are listed, but only abnormal results are displayed) Labs Reviewed  COMPREHENSIVE METABOLIC PANEL - Abnormal; Notable for the following components:      Result Value    Chloride 97 (*)    Glucose, Bld 130 (*)    BUN 38 (*)    Creatinine, Ser 2.19 (*)    Total Bilirubin 1.5 (*)    GFR, Estimated 29 (*)    All other components within normal limits  CBC - Abnormal; Notable for the following components:   WBC 13.6 (*)    Hemoglobin 17.4 (*)    All other components within normal limits  LIPASE, BLOOD  URINALYSIS, ROUTINE W REFLEX MICROSCOPIC      RADIOLOGY I have reviewed the CT personally interpreted and there is no evidence of bowel obstruction  PROCEDURES:  Critical Care performed: No  Procedures   MEDICATIONS ORDERED IN ED: Medications - No data to display   IMPRESSION / MDM / ASSESSMENT AND PLAN / ED COURSE  I reviewed the triage vital signs and the nursing notes.   Patient's presentation is most consistent with acute presentation with potential threat to life or bodily function.  Patient comes with nausea vomiting diarrhea abdominal discomfort.  CT ordered evaluate for diverticulitis, obstruction, other acute pathology.  No chest pain or shortness of breath.  Labs ordered evaluate for dehydration  Lipase is normal.  CMP shows bump in his creatinine of 2.19.  CBC shows stable hemoglobin but white count is elevated  CT imaging does show some diarrheal illness.  Stool studies were ordered.  He does have cholelithiasis but he is got no right upper quadrant tenderness on repeat examination and patient is aware of this.  He also has a large prostate but no difficulties urinating.  Also noted to have coronary artery disease which patient denied any chest pain and states that he follows up with a primary care doctor  Discussed with patient given his new AKI continue nausea and vomiting we will admit patient to the hospital team for symptomatic management and hydration.  The patient is on the cardiac monitor to evaluate for evidence of arrhythmia and/or significant heart rate changes.      FINAL CLINICAL IMPRESSION(S) / ED DIAGNOSES    Final diagnoses:  AKI (acute kidney injury) (HCC)  Gastroenteritis     Rx / DC Orders   ED Discharge Orders     None        Note:  This document was prepared using Dragon voice recognition software and may include unintentional dictation errors.   Concha Se, MD 01/16/23 [phone]

## 2023-01-17 ENCOUNTER — Encounter: Payer: Self-pay | Admitting: Family Medicine

## 2023-01-17 DIAGNOSIS — K529 Noninfective gastroenteritis and colitis, unspecified: Secondary | ICD-10-CM | POA: Diagnosis not present

## 2023-01-17 LAB — BASIC METABOLIC PANEL
Anion gap: 7 (ref 5–15)
BUN: 40 mg/dL — ABNORMAL HIGH (ref 8–23)
CO2: 26 mmol/L (ref 22–32)
Calcium: 8.2 mg/dL — ABNORMAL LOW (ref 8.9–10.3)
Chloride: 103 mmol/L (ref 98–111)
Creatinine, Ser: 1.87 mg/dL — ABNORMAL HIGH (ref 0.61–1.24)
GFR, Estimated: 35 mL/min — ABNORMAL LOW (ref >60)
Glucose, Bld: 101 mg/dL — ABNORMAL HIGH (ref 70–99)
Potassium: 3.4 mmol/L — ABNORMAL LOW (ref 3.5–5.1)
Sodium: 136 mmol/L (ref 135–145)

## 2023-01-17 LAB — CBC
HCT: 40.7 % (ref 39.0–52.0)
Hemoglobin: 13.8 g/dL (ref 13.0–17.0)
MCH: 30.5 pg (ref 26.0–34.0)
MCHC: 33.9 g/dL (ref 30.0–36.0)
MCV: 90 fL (ref 80.0–100.0)
Platelets: 193 10*3/uL (ref 150–400)
RBC: 4.52 MIL/uL (ref 4.22–5.81)
RDW: 13.2 % (ref 11.5–15.5)
WBC: 9 10*3/uL (ref 4.0–10.5)
nRBC: 0 % (ref 0.0–0.2)

## 2023-01-17 LAB — URINALYSIS, ROUTINE W REFLEX MICROSCOPIC
Bilirubin Urine: NEGATIVE
Glucose, UA: NEGATIVE mg/dL
Ketones, ur: NEGATIVE mg/dL
Leukocytes,Ua: NEGATIVE
Nitrite: NEGATIVE
Protein, ur: NEGATIVE mg/dL
Specific Gravity, Urine: 1.009 (ref 1.005–1.030)
pH: 5 (ref 5.0–8.0)

## 2023-01-17 MED ORDER — ONDANSETRON 8 MG PO TBDP: 8.0000 mg | ORAL_TABLET | Freq: Three times a day (TID) | ORAL | 0 refills | Status: DC | PRN

## 2023-01-17 MED ORDER — POTASSIUM CHLORIDE CRYS ER 20 MEQ PO TBCR
40.0000 meq | EXTENDED_RELEASE_TABLET | Freq: Once | ORAL | Status: AC
  Administered 2023-01-17: 40 meq via ORAL
  Filled 2023-01-17: qty 2

## 2023-01-17 NOTE — Discharge Summary (Signed)
Physician Discharge Summary   Patient: Walter Knox MRN: [id number] DOB: 04-01-1941  Admit date:     01/16/2023  Discharge date: 01/17/2023  Discharge Physician: Pennie Banter   PCP: Marisue Ivan, MD   Recommendations at discharge:    Follow up with Primary Care in 1-2 weeks Repeat CMP, CBC at follow up  Discharge Diagnoses: Principal Problem:   Acute gastroenteritis Active Problems:   AKI (acute kidney injury) (HCC)   Type 2 diabetes mellitus with complication, without long-term current use of insulin (HCC)   Essential hypertension   Paroxysmal atrial fibrillation (HCC)   Dyslipidemia  Resolved Problems:   * No resolved hospital problems. [facility] Course: HPI on admission 01/16/23 by Dr. Arville Care: "Walter Knox is a 82 y.o. male with medical history significant for coronary artery disease, essential hypertension, dyslipidemia, prediabetes, peripheral vascular disease and SSS s/p PPM, presented to the emergency room with acute onset of   intractable nausea, vomiting and diarrhea that started on Saturday with associated abdominal tenderness mostly in the lower abdomen.  He has been taking Imodium for watery bowel movements which has been helping but continues to have vomiting.  He reported he felt like his vomitus was dark "as if that had stools".  No fever or chills.  No melena or bright red bleeding per rectum.  No chest pain or palpitations.  No cough or wheezing or dyspnea.  No dysuria, liquidy or hematuria or flank pain.   ED Course: Upon presentation to the emergency room, BP was 103/91 with otherwise normal vital signs.  Labs revealed mild hypoanemia of 97 and a BUN of 38 with creatinine 2.19 up from 1.3 in January 2023.  Total bili was 1.5 with otherwise unremarkable CMP.  CBC showed leukocytosis 13.6.  Abdominal pelvic CT scan without contrast revealed the following EKG  : None   Imaging: Abdominal pelvic CT scan revealed the following: 1. The small bowel and  colon are fluid-filled to the rectum, consistent with diarrheal illness. No evidence of bowel obstruction or inflammation. 2. Sigmoid diverticulosis without evidence of acute diverticulitis. 3. Cholelithiasis. 4. Prostatomegaly. 5. Coronary artery disease. 6.  Aortic atherosclerosis.   The patient was given 1 L bolus of IV normal saline and 4 mg of IV Zofran.  He will be admitted to a medical observation bed for further evaluation and management.."   Further hospital course and management as outlined below.  10/8 -- pt reports symptoms have resolved.   Renal function improved with IV hydration. Pt tolerating diet today.   C. Diff and GI panel both negative.  Diarrhea has improved significantly. He is requesting discharge home today and is medically stable.      Assessment and Plan: * Acute gastroenteritis - The patient will be admitted to a medical observation bed. - We will continue addition of the normal saline. - Will follow GI panel that was ordered. - We will utilize as needed Imodium as needed with negative C. difficile.  AKI (acute kidney injury) (HCC) - Will continue hydration with IV normal saline. - We will avoid nephrotoxins. - Will follow BMPs.  Type 2 diabetes mellitus with complication, without long-term current use of insulin (HCC) - We will place the patient on supplemental coverage with NovoLog.  Essential hypertension - We will continue Lopressor and hold off diuretic therapy and ARB given acute kidney injury.  Dyslipidemia - Will continue Zetia and statin therapy.  Paroxysmal atrial fibrillation (HCC) - We will continue amiodarone and Eliquis. -  Will check an EKG.  Hypokalemia - K 3.4 was replaced --Repeat labs at PCP follo wup       Consultants: None Procedures performed: None  Disposition: Home Diet recommendation:  Discharge Diet Orders (From admission, onward)     Start     Ordered   01/17/23 0000  Diet - low sodium heart healthy         01/17/23 1218   01/17/23 0000  Diet - low sodium heart healthy        01/17/23 1221            DISCHARGE MEDICATION: Allergies as of 01/17/2023   No Known Allergies      Medication List     STOP taking these medications    hydrochlorothiazide 25 MG tablet Commonly known as: HYDRODIURIL   losartan-hydrochlorothiazide 50-12.5 MG tablet Commonly known as: HYZAAR   torsemide 20 MG tablet Commonly known as: DEMADEX       TAKE these medications    acetaminophen 500 MG tablet Commonly known as: TYLENOL Take 1,000 mg by mouth 2 (two) times daily. What changed: Another medication with the same name was removed. Continue taking this medication, and follow the directions you see here.   amiodarone 200 MG tablet Commonly known as: PACERONE Take 200 mg by mouth daily.   docusate sodium 100 MG capsule Commonly known as: COLACE Take 100 mg by mouth 2 (two) times daily.   Eliquis 5 MG Tabs tablet Generic drug: apixaban Take 5 mg by mouth 2 (two) times daily.   ezetimibe 10 MG tablet Commonly known as: ZETIA Take 10 mg by mouth at bedtime.   lovastatin 20 MG 24 hr tablet Commonly known as: ALTOPREV Take 20 mg by mouth at bedtime.   metFORMIN 500 MG 24 hr tablet Commonly known as: GLUCOPHAGE-XR Take 500 mg by mouth daily with supper.   metoprolol tartrate 100 MG tablet Commonly known as: LOPRESSOR Take 100 mg by mouth 2 (two) times daily.   multivitamin tablet Take 1 tablet by mouth daily.   omeprazole 20 MG capsule Commonly known as: PRILOSEC Take 20 mg by mouth at bedtime.   ondansetron 8 MG disintegrating tablet Commonly known as: ZOFRAN-ODT Take 1 tablet (8 mg total) by mouth every 8 (eight) hours as needed.   Ozempic (0.25 or 0.5 MG/DOSE) 2 MG/3ML Sopn Generic drug: Semaglutide(0.25 or 0.5MG /DOS) Inject 1 mg into the skin once a week. (Sunday)   polycarbophil 625 MG tablet Commonly known as: FIBERCON Take 1,875 mg by mouth 2 (two) times  daily.        Discharge Exam: Filed Weights   01/16/23 1647  Weight: 122.5 kg   General exam: awake, alert, no acute distress HEENT: atraumatic, clear conjunctiva, anicteric sclera, moist mucus membranes, hearing grossly normal  Respiratory system: CTAB, no wheezes, rales or rhonchi, normal respiratory effort. Cardiovascular system: normal S1/S2, RRR, no JVD, murmurs, rubs, gallops, no pedal edema.   Gastrointestinal system: soft, NT, ND, no HSM felt, +bowel sounds. Central nervous system: A&O x 4. no gross focal neurologic deficits, normal speech Extremities: moves all, no edema, normal tone Skin: dry, intact, normal temperature, normal color, No rashes, lesions or ulcers Psychiatry: normal mood, congruent affect, judgement and insight appear normal   Condition at discharge: stable  The results of significant diagnostics from this hospitalization (including imaging, microbiology, ancillary and laboratory) are listed below for reference.   Imaging Studies: CT ABDOMEN PELVIS WO CONTRAST  Result Date: 01/16/2023 CLINICAL DATA:  Abdominal pain,  diarrhea, emesis EXAM: CT ABDOMEN AND PELVIS WITHOUT CONTRAST TECHNIQUE: Multidetector CT imaging of the abdomen and pelvis was performed following the standard protocol without IV contrast. RADIATION DOSE REDUCTION: This exam was performed according to the departmental dose-optimization program which includes automated exposure control, adjustment of the mA and/or kV according to patient size and/or use of iterative reconstruction technique. COMPARISON:  09/13/2022 FINDINGS: Lower chest: No acute abnormality.  Coronary artery calcifications. Hepatobiliary: No solid liver abnormality is seen. Multiple unchanged cysts throughout the liver. Gallstone. No gallbladder wall thickening, or biliary dilatation. Pancreas: Unremarkable. No pancreatic ductal dilatation or surrounding inflammatory changes. Spleen: Normal in size without significant abnormality.  Adrenals/Urinary Tract: Adrenal glands are unremarkable. Simple, benign right renal cortical cysts, for which no further follow-up or characterization is required. Kidneys are otherwise normal, without renal calculi, solid lesion, or hydronephrosis. Bladder is unremarkable. Stomach/Bowel: Stomach is within normal limits. Appendix appears normal. No evidence of bowel wall thickening, distention, or inflammatory changes. Sigmoid diverticulosis. The small bowel and colon are fluid-filled to the rectum. Incidental note of a small benign mural lipoma of the ascending colon, for which no further follow-up or characterization is required (series 2, image 69). Vascular/Lymphatic: Aortic atherosclerosis. No enlarged abdominal or pelvic lymph nodes. Reproductive: Prostatomegaly. Other: No abdominal wall hernia or abnormality. No ascites. Musculoskeletal: No acute or significant osseous findings. IMPRESSION: 1. The small bowel and colon are fluid-filled to the rectum, consistent with diarrheal illness. No evidence of bowel obstruction or inflammation. 2. Sigmoid diverticulosis without evidence of acute diverticulitis. 3. Cholelithiasis. 4. Prostatomegaly. 5. Coronary artery disease. Aortic Atherosclerosis (ICD10-I70.0). Electronically Signed   By: Jearld Lesch M.D.   On: 01/16/2023 21:02    Microbiology: Results for orders placed or performed during the hospital encounter of 01/16/23  Gastrointestinal Panel by PCR , Stool     Status: None   Collection Time: 01/16/23  9:50 PM   Specimen: Stool  Result Value Ref Range Status   Campylobacter species NOT DETECTED NOT DETECTED Final   Plesimonas shigelloides NOT DETECTED NOT DETECTED Final   Salmonella species NOT DETECTED NOT DETECTED Final   Yersinia enterocolitica NOT DETECTED NOT DETECTED Final   Vibrio species NOT DETECTED NOT DETECTED Final   Vibrio cholerae NOT DETECTED NOT DETECTED Final   Enteroaggregative E coli (EAEC) NOT DETECTED NOT DETECTED Final    Enteropathogenic E coli (EPEC) NOT DETECTED NOT DETECTED Final   Enterotoxigenic E coli (ETEC) NOT DETECTED NOT DETECTED Final   Shiga like toxin producing E coli (STEC) NOT DETECTED NOT DETECTED Final   Shigella/Enteroinvasive E coli (EIEC) NOT DETECTED NOT DETECTED Final   Cryptosporidium NOT DETECTED NOT DETECTED Final   Cyclospora cayetanensis NOT DETECTED NOT DETECTED Final   Entamoeba histolytica NOT DETECTED NOT DETECTED Final   Giardia lamblia NOT DETECTED NOT DETECTED Final   Adenovirus F40/41 NOT DETECTED NOT DETECTED Final   Astrovirus NOT DETECTED NOT DETECTED Final   Norovirus GI/GII NOT DETECTED NOT DETECTED Final   Rotavirus A NOT DETECTED NOT DETECTED Final   Sapovirus (I, II, IV, and V) NOT DETECTED NOT DETECTED Final    Comment: Performed at [facility], [address] [place], [place] [address]  C Difficile Quick Screen w PCR reflex     Status: None   Collection Time: 01/16/23  9:50 PM   Specimen: Stool  Result Value Ref Range Status   C Diff antigen NEGATIVE NEGATIVE Final   C Diff toxin NEGATIVE NEGATIVE Final   C Diff interpretation  No C. difficile detected.  Final    Comment: Performed at [facility], [address] [place], [place] [address]    Labs: CBC: Recent Labs  Lab 01/16/23 1646 01/17/23 0508  WBC 13.6* 9.0  HGB 17.4* 13.8  HCT 51.4 40.7  MCV 89.7 90.0  PLT 265 193   Basic Metabolic Panel: Recent Labs  Lab 01/16/23 1646 01/17/23 0508  NA 137 136  K 4.5 3.4*  CL 97* 103  CO2 28 26  GLUCOSE 130* 101*  BUN 38* 40*  CREATININE 2.19* 1.87*  CALCIUM 9.6 8.2*   Liver Function Tests: Recent Labs  Lab 01/16/23 1646  AST 23  ALT 24  ALKPHOS 64  BILITOT 1.5*  PROT 7.9  ALBUMIN 4.3   CBG: No results for input(s): "GLUCAP" in the last 168 hours.  Discharge time spent: less than 30 minutes.  Signed: Pennie Banter, DO [facility] 01/17/2023

## 2023-01-17 NOTE — ED Notes (Signed)
Pt ambulated to restroom and back to bed safely.

## 2023-02-01 ENCOUNTER — Other Ambulatory Visit: Payer: Self-pay | Admitting: Otolaryngology

## 2023-02-01 DIAGNOSIS — E041 Nontoxic single thyroid nodule: Secondary | ICD-10-CM

## 2023-02-03 ENCOUNTER — Encounter: Payer: Self-pay | Admitting: Urology

## 2023-02-06 ENCOUNTER — Ambulatory Visit
Admission: RE | Admit: 2023-02-06 | Discharge: 2023-02-06 | Disposition: A | Discharge status: Home / Self Care | Payer: Medicare Other | Source: Ambulatory Visit | Attending: Otolaryngology | Admitting: Otolaryngology

## 2023-02-06 ENCOUNTER — Ambulatory Visit: Payer: Medicare Other | Admitting: Urology

## 2023-02-06 ENCOUNTER — Encounter: Payer: Self-pay | Admitting: Urology

## 2023-02-06 VITALS — BP 120/89 | HR 93 | Ht 69.5 in | Wt 268.0 lb

## 2023-02-06 DIAGNOSIS — R310 Gross hematuria: Secondary | ICD-10-CM | POA: Diagnosis not present

## 2023-02-06 DIAGNOSIS — E041 Nontoxic single thyroid nodule: Secondary | ICD-10-CM | POA: Diagnosis present

## 2023-02-06 DIAGNOSIS — N400 Enlarged prostate without lower urinary tract symptoms: Secondary | ICD-10-CM

## 2023-02-06 LAB — URINALYSIS, COMPLETE
Bilirubin, UA: NEGATIVE
Glucose, UA: NEGATIVE
Ketones, UA: NEGATIVE
Leukocytes,UA: NEGATIVE
Nitrite, UA: NEGATIVE
Protein,UA: NEGATIVE
Specific Gravity, UA: 1.015 (ref 1.005–1.030)
Urobilinogen, Ur: 0.2 mg/dL (ref 0.2–1.0)
pH, UA: 7 (ref 5.0–7.5)

## 2023-02-06 LAB — MICROSCOPIC EXAMINATION

## 2023-02-06 MED ORDER — DUTASTERIDE 0.5 MG PO CAPS
0.5000 mg | ORAL_CAPSULE | Freq: Every day | ORAL | 3 refills | Status: AC
Start: 2023-02-06 — End: 2023-05-07

## 2023-02-06 NOTE — Progress Notes (Signed)
I, Walter Knox, acting as a scribe for Walter Altes, MD., have documented all relevant documentation on the behalf of Walter Altes, MD, as directed by Walter Altes, MD while in the presence of Walter Altes, MD.  02/06/2023 10:11 AM   Walter Knox 05/30/1940 [id number]  Referring provider: Marisue Ivan, MD [phone] [facility] [address] [facility] [place],  [place] [address]  Chief Complaint  Patient presents with   Hematuria   Urologic history: 1.  Gross hematuria AUA risk stratification: High Cystoscopy ordered  HPI: [name] is a 82 y.o. male presents in follow-up of a recent episode of gross hematuria.  Initially seen May 2024 for total gross painful hematuria and microhematuria. CT urogram showed a 9 cm right upper pole simple cyst and smaller simple renal cyst. There was a Bosniak 2 right renal cyst and prostate enlargement with a volume calculated at 77 cc.  Cystoscopy remarkable for prominent lateral lobe enlargement with hypervascularity and friability. He is on Eliquis and had an episode of total gross painless hematuria last week. He saw his PCP and urinalysis showed >182 RBCs. Urine culture was ordered, which was a negative. He has no flank, abdominal, pelvic pain or irritative voiding symptoms.   PMH: Past Medical History:  Diagnosis Date   Anginal pain (HCC)    BPH (benign prostatic hyperplasia)    CAD (coronary artery disease)    Cancer (HCC)    Chronic back pain    Dyspnea    Dysrhythmia    Environmental and seasonal allergies    Hyperlipidemia    Hypertension    Lower extremity edema    Morbid obesity (HCC)    Pre-diabetes    PVD (peripheral vascular disease) (HCC)    SSS (sick sinus syndrome) (HCC)     Surgical History: Past Surgical History:  Procedure Laterality Date   CARDIOVERSION N/A 06/02/2022   Procedure: CARDIOVERSION;  Surgeon: Marcina Millard, MD;  Location: [facility] ORS;  Service: Cardiovascular;   Laterality: N/A;   COLONOSCOPY     COLONOSCOPY WITH ESOPHAGOGASTRODUODENOSCOPY (EGD)     COLONOSCOPY WITH PROPOFOL N/A 03/31/2017   Procedure: COLONOSCOPY WITH PROPOFOL;  Surgeon: Christena Deem, MD;  Location: [facility];  Service: Endoscopy;  Laterality: N/A;   COLONOSCOPY WITH PROPOFOL N/A 08/09/2017   Procedure: COLONOSCOPY WITH PROPOFOL;  Surgeon: Toledo, Boykin Nearing, MD;  Location: [facility] ENDOSCOPY;  Service: Gastroenterology;  Laterality: N/A;   CORONARY ANGIOPLASTY WITH STENT PLACEMENT     JOINT REPLACEMENT Bilateral 2006 2011   KNEE SURGERY     PACEMAKER INSERTION N/A 05/03/2017   Procedure: INSERTION PACEMAKER-DUAL CHAMBER INITIAL INSERT;  Surgeon: Marcina Millard, MD;  Location: [facility] ORS;  Service: Cardiovascular;  Laterality: N/A;   Pilondial Cyst Removal     TONSILLECTOMY     Total Right Knee Replacement      Home Medications:  Allergies as of 02/06/2023   No Known Allergies      Medication List        Accurate as of February 06, 2023 10:11 AM. If you have any questions, ask your nurse or doctor.          acetaminophen 500 MG tablet Commonly known as: TYLENOL Take 1,000 mg by mouth 2 (two) times daily.   amiodarone 200 MG tablet Commonly known as: PACERONE Take 200 mg by mouth daily.   docusate sodium 100 MG capsule Commonly known as: COLACE Take 100 mg by mouth 2 (two) times daily.  dutasteride 0.5 MG capsule Commonly known as: AVODART Take 1 capsule (0.5 mg total) by mouth daily. Started by: Walter Knox   Eliquis 5 MG Tabs tablet Generic drug: apixaban Take 5 mg by mouth 2 (two) times daily.   ezetimibe 10 MG tablet Commonly known as: ZETIA Take 10 mg by mouth at bedtime.   lovastatin 20 MG 24 hr tablet Commonly known as: ALTOPREV Take 20 mg by mouth at bedtime.   metFORMIN 500 MG 24 hr tablet Commonly known as: GLUCOPHAGE-XR Take 500 mg by mouth daily with supper.   metoprolol tartrate 100 MG tablet Commonly known as:  LOPRESSOR Take 100 mg by mouth 2 (two) times daily.   multivitamin tablet Take 1 tablet by mouth daily.   omeprazole 20 MG capsule Commonly known as: PRILOSEC Take 20 mg by mouth at bedtime.   ondansetron 8 MG disintegrating tablet Commonly known as: ZOFRAN-ODT Take 1 tablet (8 mg total) by mouth every 8 (eight) hours as needed.   Ozempic (0.25 or 0.5 MG/DOSE) 2 MG/3ML Sopn Generic drug: Semaglutide(0.25 or 0.5MG /DOS) Inject 1 mg into the skin once a week. (Sunday)   polycarbophil 625 MG tablet Commonly known as: FIBERCON Take 1,875 mg by mouth 2 (two) times daily.        Allergies: No Known Allergies   Social History:  reports that he quit smoking about 21 years ago. His smoking use included cigarettes. He started smoking about 61 years ago. He has a 80 pack-year smoking history. He has never used smokeless tobacco. He reports current alcohol use of about 3.0 standard drinks of alcohol per week. He reports that he does not use drugs.   Physical Exam: BP 120/89   Pulse 93   Ht 5' 9.5" (1.765 m)   Wt 268 lb (121.6 kg)   BMI 39.01 kg/m   Constitutional:  Alert, No acute distress. HEENT: [place] AT Respiratory: Normal respiratory effort, no increased work of breathing. Psychiatric: Normal mood and affect.   Urinalysis Trace blood dipstick/microscopy 3-10 RBC   Pertinent Imaging: CT was personally reviewed and interpreted.   CT HEMATURIA WORKUP  Narrative CLINICAL DATA:  Hematuria for 2 months.  EXAM: CT ABDOMEN AND PELVIS WITHOUT AND WITH CONTRAST  TECHNIQUE: Multidetector CT imaging of the abdomen and pelvis was performed following the standard protocol before and following the bolus administration of intravenous contrast.  RADIATION DOSE REDUCTION: This exam was performed according to the departmental dose-optimization program which includes automated exposure control, adjustment of the mA and/or kV according to patient size and/or use of iterative  reconstruction technique.  CONTRAST:  OMNIPAQUE IOHEXOL 300 MG/ML  SOLN  COMPARISON:  05/03/2021  FINDINGS: Lower chest: No acute abnormality. Calcified granuloma noted in the right lower lobe.  Hepatobiliary: Multiple liver cysts are identified within both lobes of the liver as noted previously. Similar in size and multiplicity compared with the previous exam. The largest is in segment 3 measuring 2.8 cm, image 20/9. Stone within the gallbladder neck measures 1.5 cm. No gallbladder wall inflammation or bile duct dilatation.  Pancreas: Unremarkable. No pancreatic ductal dilatation or surrounding inflammatory changes.  Spleen: Normal in size without focal abnormality.  Adrenals/Urinary Tract: Normal adrenal glands.  -Upper pole right kidney cyst measures 9.2 cm, image 31/9. This is compatible with a benign Bosniak class 1 cyst. Within the posterior cortex of the upper pole of right kidney there is a small cyst measuring 1.3 cm. Also compatible with a Bosniak class 1 lesion. Too small  to characterize exophytic lesion off the anterior cortex of the inferior pole of right kidney measures 6 mm and is compatible with a Bosniak class 2 cyst.  No kidney stones identified bilaterally. No signs of hydronephrosis. No suspicious filling defects within the collecting systems on the delayed images. Ureters appear patent bilaterally.  Bladder appears normal.  Stomach/Bowel: Stomach is within normal limits. The appendix is visualized and appears normal. Sigmoid diverticulosis without signs of acute diverticulitis. No bowel wall thickening, inflammation or distension.  Vascular/Lymphatic: Aortic atherosclerosis. No signs of abdominopelvic adenopathy.  Reproductive: Measures 6.2 x 5.3 by 4.5 cm (volume = 77 cm^3).  Other: Small fat containing umbilical hernia. No abdominopelvic ascites.  Musculoskeletal: No acute or significant osseous findings. 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 No acute or suspicious osseous abnormality.  IMPRESSION: 1. No acute findings within the abdomen or pelvis. No explanation for patient's hematuria. 2. 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 No follow-up imaging recommended. 3. Gallstones. 4. Prostate gland enlargement. 5. Small fat containing umbilical hernia. 6. 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 7.  Aortic Atherosclerosis (ICD10-I70.0).   Electronically Signed By: Signa Kell M.D. On: 09/19/2022 13:51   Assessment & Plan:    1. Total gross painless hematuria Recent evaluation remarkable for prominent lateral lobe enlargement with hypervascularity/variability, which is the most likely source of his hematuria in combination with his anticoagulant.  Urine cytology ordered.  Recommend starting a 5-ARI and Rx dutasteride sent to pharmacy. Potential side effects were discussed including ED and decreased semen volume.  He has a follow-up visit scheduled June 2025. Instructed to call earlier for persistent recurrent gross hematuria.   I have reviewed the above documentation for accuracy and completeness, and I agree with the above.   Walter Altes, MD  [facility] [address], [address] [place], [place] [address] [phone]

## 2023-02-24 NOTE — Progress Notes (Signed)
Gi studies ordered due to patient age and diarrhea. Would benefit from treatment if positive.

## 2023-03-16 ENCOUNTER — Encounter: Payer: Medicare Other | Attending: Pulmonary Disease | Admitting: *Deleted

## 2023-03-16 DIAGNOSIS — E7800 Pure hypercholesterolemia, unspecified: Secondary | ICD-10-CM | POA: Insufficient documentation

## 2023-03-16 DIAGNOSIS — R0609 Other forms of dyspnea: Secondary | ICD-10-CM

## 2023-03-16 DIAGNOSIS — R0600 Dyspnea, unspecified: Secondary | ICD-10-CM | POA: Insufficient documentation

## 2023-03-16 NOTE — Progress Notes (Signed)
Initial phone call completed. Diagnosis can be found in [facility] 11/14. EP Orientation scheduled for Monday 12/16 at 2:30.

## 2023-03-27 VITALS — Ht 69.2 in | Wt 270.7 lb

## 2023-03-27 DIAGNOSIS — R0609 Other forms of dyspnea: Secondary | ICD-10-CM

## 2023-03-27 DIAGNOSIS — R0600 Dyspnea, unspecified: Secondary | ICD-10-CM | POA: Diagnosis present

## 2023-03-27 NOTE — Progress Notes (Signed)
Pulmonary Individual Treatment Plan  Patient Details  Name: Walter Knox MRN: [id number] Date of Birth: 1940/05/30 Referring Provider:   Flowsheet Row Pulmonary Rehab from 03/27/2023 in [facility] Cardiac and Pulmonary Rehab  Referring Provider Vida Rigger, MD       Initial Encounter Date:  Flowsheet Row Pulmonary Rehab from 03/27/2023 in [facility] Cardiac and Pulmonary Rehab  Date 03/27/23       Visit Diagnosis: Dyspnea on exertion  Patient's Home Medications on Admission:  Current Outpatient Medications:    acetaminophen (TYLENOL) 500 MG tablet, Take 1,000 mg by mouth 2 (two) times daily., Disp: , Rfl:    amiodarone (PACERONE) 200 MG tablet, Take 200 mg by mouth daily., Disp: , Rfl:    docusate sodium (COLACE) 100 MG capsule, Take 100 mg by mouth 2 (two) times daily., Disp: , Rfl:    dutasteride (AVODART) 0.5 MG capsule, Take 1 capsule (0.5 mg total) by mouth daily., Disp: 90 capsule, Rfl: 3   ELIQUIS 5 MG TABS tablet, Take 5 mg by mouth 2 (two) times daily., Disp: , Rfl:    ezetimibe (ZETIA) 10 MG tablet, Take 10 mg by mouth at bedtime., Disp: , Rfl:    lovastatin (ALTOPREV) 20 MG 24 hr tablet, Take 20 mg by mouth at bedtime., Disp: , Rfl:    metFORMIN (GLUCOPHAGE-XR) 500 MG 24 hr tablet, Take 500 mg by mouth daily with supper., Disp: , Rfl:    metoprolol tartrate (LOPRESSOR) 100 MG tablet, Take 100 mg by mouth 2 (two) times daily., Disp: , Rfl:    Multiple Vitamin (MULTIVITAMIN) tablet, Take 1 tablet by mouth daily., Disp: , Rfl:    omeprazole (PRILOSEC) 20 MG capsule, Take 20 mg by mouth at bedtime. , Disp: , Rfl:    ondansetron (ZOFRAN-ODT) 8 MG disintegrating tablet, Take 1 tablet (8 mg total) by mouth every 8 (eight) hours as needed., Disp: 20 tablet, Rfl: 0   polycarbophil (FIBERCON) 625 MG tablet, Take 1,875 mg by mouth 2 (two) times daily., Disp: , Rfl:    torsemide (DEMADEX) 20 MG tablet, 20 mg once., Disp: , Rfl:   Past Medical History: Past Medical History:  Diagnosis  Date   Anginal pain (HCC)    BPH (benign prostatic hyperplasia)    CAD (coronary artery disease)    Cancer (HCC)    Chronic back pain    Dyspnea    Dysrhythmia    Environmental and seasonal allergies    Hyperlipidemia    Hypertension    Lower extremity edema    Morbid obesity (HCC)    Pre-diabetes    PVD (peripheral vascular disease) (HCC)    SSS (sick sinus syndrome) (HCC)     Tobacco Use: Social History   Tobacco Use  Smoking Status Former   Current packs/day: 0.00   Average packs/day: 2.0 packs/day for 40.0 years (80.0 ttl pk-yrs)   Types: Cigarettes   Start date: 10/09/1961   Quit date: 10/09/2001   Years since quitting: 21.4  Smokeless Tobacco Never    Labs: Review Flowsheet        No data to display           Pulmonary Assessment Scores:  Pulmonary Assessment Scores     Row Name 03/27/23 1647         CAT Score   CAT Score 10       mMRC Score   mMRC Score 3              UCSD: Self-administered  rating of dyspnea associated with activities of daily living (ADLs) 6-point scale (0 = "not at all" to 5 = "maximal or unable to do because of breathlessness")  Scoring Scores range from 0 to 120.  Minimally important difference is 5 units  CAT: CAT can identify the health impairment of COPD patients and is better correlated with disease progression.  CAT has a scoring range of zero to 40. The CAT score is classified into four groups of low (less than 10), medium (10 - 20), high (21-30) and very high (31-40) based on the impact level of disease on health status. A CAT score over 10 suggests significant symptoms.  A worsening CAT score could be explained by an exacerbation, poor medication adherence, poor inhaler technique, or progression of COPD or comorbid conditions.  CAT MCID is 2 points  mMRC: mMRC (Modified Medical Research Council) Dyspnea Scale is used to assess the degree of baseline functional disability in patients of respiratory disease due to  dyspnea. No minimal important difference is established. A decrease in score of 1 point or greater is considered a positive change.   Pulmonary Function Assessment:   Exercise Target Goals: Exercise Program Goal: Individual exercise prescription set using results from initial 6 min walk test and THRR while considering  patient's activity barriers and safety.   Exercise Prescription Goal: Initial exercise prescription builds to 30-45 minutes a day of aerobic activity, 2-3 days per week.  Home exercise guidelines will be given to patient during program as part of exercise prescription that the participant will acknowledge.  Education: Aerobic Exercise: - Group verbal and visual presentation on the components of exercise prescription. Introduces F.I.T.T principle from ACSM for exercise prescriptions.  Reviews F.I.T.T. principles of aerobic exercise including progression. Written material given at graduation.   Education: Resistance Exercise: - Group verbal and visual presentation on the components of exercise prescription. Introduces F.I.T.T principle from ACSM for exercise prescriptions  Reviews F.I.T.T. principles of resistance exercise including progression. Written material given at graduation.    Education: Exercise & Equipment Safety: - Individual verbal instruction and demonstration of equipment use and safety with use of the equipment. Flowsheet Row Pulmonary Rehab from 03/27/2023 in [facility] Cardiac and Pulmonary Rehab  Date 03/27/23  Educator MB  Instruction Review Code 1- Verbalizes Understanding       Education: Exercise Physiology & General Exercise Guidelines: - Group verbal and written instruction with models to review the exercise physiology of the cardiovascular system and associated critical values. Provides general exercise guidelines with specific guidelines to those with heart or lung disease.    Education: Flexibility, Balance, Mind/Body Relaxation: - Group verbal  and visual presentation with interactive activity on the components of exercise prescription. Introduces F.I.T.T principle from ACSM for exercise prescriptions. Reviews F.I.T.T. principles of flexibility and balance exercise training including progression. Also discusses the mind body connection.  Reviews various relaxation techniques to help reduce and manage stress (i.e. Deep breathing, progressive muscle relaxation, and visualization). Balance handout provided to take home. Written material given at graduation.   Activity Barriers & Risk Stratification:  Activity Barriers & Cardiac Risk Stratification - 03/27/23 1641       Activity Barriers & Cardiac Risk Stratification   Activity Barriers Left Knee Replacement;Right Knee Replacement;Deconditioning             6 Minute Walk:  6 Minute Walk     Row Name 03/27/23 1640         6 Minute Walk   Phase Initial  Distance 1185 feet     Walk Time 6 minutes     # of Rest Breaks 0     MPH 2.24     METS 1.45     RPE 5.07     Perceived Dyspnea  3     VO2 Peak 5.07     Symptoms Yes (comment)     Comments SOB     Resting HR 60 bpm     Resting BP 122/80     Resting Oxygen Saturation  93 %     Exercise Oxygen Saturation  during 6 min walk 94 %     Max Ex. HR 101 bpm     Max Ex. BP 154/76     2 Minute Post BP 126/76       Interval HR   1 Minute HR 96     2 Minute HR 101     3 Minute HR 80     4 Minute HR 84     5 Minute HR 84     6 Minute HR 90     2 Minute Post HR 60     Interval Heart Rate? Yes       Interval Oxygen   Interval Oxygen? Yes     Baseline Oxygen Saturation % 93 %     1 Minute Oxygen Saturation % 94 %     1 Minute Liters of Oxygen 0 L     2 Minute Oxygen Saturation % 95 %     2 Minute Liters of Oxygen 0 L     3 Minute Oxygen Saturation % 95 %     3 Minute Liters of Oxygen 0 L     4 Minute Oxygen Saturation % 96 %     4 Minute Liters of Oxygen 0 L     5 Minute Oxygen Saturation % 96 %     5 Minute  Liters of Oxygen 0 L     6 Minute Oxygen Saturation % 96 %     6 Minute Liters of Oxygen 0 L     2 Minute Post Oxygen Saturation % 97 %     2 Minute Post Liters of Oxygen 0 L             Oxygen Initial Assessment:  Oxygen Initial Assessment - 03/16/23 1431       Home Oxygen   Home Oxygen Device None    Sleep Oxygen Prescription None    Home Exercise Oxygen Prescription None    Home Resting Oxygen Prescription None    Compliance with Home Oxygen Use Yes      Intervention   Short Term Goals To learn and understand importance of monitoring SPO2 with pulse oximeter and demonstrate accurate use of the pulse oximeter.;To learn and understand importance of maintaining oxygen saturations>88%;To learn and demonstrate proper pursed lip breathing techniques or other breathing techniques. ;To learn and demonstrate proper use of respiratory medications    Long  Term Goals Verbalizes importance of monitoring SPO2 with pulse oximeter and return demonstration;Maintenance of O2 saturations>88%;Exhibits proper breathing techniques, such as pursed lip breathing or other method taught during program session;Compliance with respiratory medication;Demonstrates proper use of MDI's             Oxygen Re-Evaluation:   Oxygen Discharge (Final Oxygen Re-Evaluation):   Initial Exercise Prescription:  Initial Exercise Prescription - 03/27/23 1600       Date of Initial Exercise RX and Referring Provider  Date 03/27/23    Referring Provider Vida Rigger, MD      Oxygen   Maintain Oxygen Saturation 88% or higher      Treadmill   MPH 2    Grade 0    Minutes 15    METs 2.53      Recumbant Bike   Level 1    RPM 50    Watts 15    Minutes 15    METs 1.45      NuStep   Level 1    SPM 80    Minutes 15    METs 1.45      Track   Laps 28    Minutes 15    METs 2.52      Prescription Details   Frequency (times per week) 2    Duration Progress to 30 minutes of continuous aerobic  without signs/symptoms of physical distress      Intensity   THRR 40-80% of Max Heartrate 90-121    Ratings of Perceived Exertion 11-13    Perceived Dyspnea 0-4      Progression   Progression Continue to progress workloads to maintain intensity without signs/symptoms of physical distress.      Resistance Training   Training Prescription Yes    Weight 7 lb    Reps 10-15             Perform Capillary Blood Glucose checks as needed.  Exercise Prescription Changes:   Exercise Prescription Changes     Row Name 03/27/23 1600             Response to Exercise   Blood Pressure (Admit) 122/80       Blood Pressure (Exercise) 154/76       Blood Pressure (Exit) 126/76       Heart Rate (Admit) 60 bpm       Heart Rate (Exercise) 101 bpm       Heart Rate (Exit) 60 bpm       Oxygen Saturation (Admit) 93 %       Oxygen Saturation (Exercise) 94 %       Oxygen Saturation (Exit) 97 %       Rating of Perceived Exertion (Exercise) 15       Perceived Dyspnea (Exercise) 3       Symptoms SOB       Comments results       Duration Progress to 30 minutes of  aerobic without signs/symptoms of physical distress       Intensity THRR New         Progression   Progression Continue to progress workloads to maintain intensity without signs/symptoms of physical distress.       Average METs 1.45                Exercise Comments:   Exercise Goals and Review:   Exercise Goals     Row Name 03/27/23 1646             Exercise Goals   Increase Physical Activity Yes       Intervention Provide advice, education, support and counseling about physical activity/exercise needs.;Develop an individualized exercise prescription for aerobic and resistive training based on initial evaluation findings, risk stratification, comorbidities and participant's personal goals.       Expected Outcomes Short Term: Attend rehab on a regular basis to increase amount of physical activity.;Long Term: Add  in home exercise to make exercise part of routine and to  increase amount of physical activity.;Long Term: Exercising regularly at least 3-5 days a week.       Increase Strength and Stamina Yes       Intervention Provide advice, education, support and counseling about physical activity/exercise needs.;Develop an individualized exercise prescription for aerobic and resistive training based on initial evaluation findings, risk stratification, comorbidities and participant's personal goals.       Expected Outcomes Short Term: Increase workloads from initial exercise prescription for resistance, speed, and METs.;Short Term: Perform resistance training exercises routinely during rehab and add in resistance training at home;Long Term: Improve cardiorespiratory fitness, muscular endurance and strength as measured by increased METs and functional capacity ( )       Able to understand and use rate of perceived exertion (RPE) scale Yes       Intervention Provide education and explanation on how to use RPE scale       Expected Outcomes Short Term: Able to use RPE daily in rehab to express subjective intensity level;Long Term:  Able to use RPE to guide intensity level when exercising independently       Able to understand and use Dyspnea scale Yes       Intervention Provide education and explanation on how to use Dyspnea scale       Expected Outcomes Short Term: Able to use Dyspnea scale daily in rehab to express subjective sense of shortness of breath during exertion;Long Term: Able to use Dyspnea scale to guide intensity level when exercising independently       Knowledge and understanding of Target Heart Rate Range (THRR) Yes       Intervention Provide education and explanation of THRR including how the numbers were predicted and where they are located for reference       Expected Outcomes Short Term: Able to state/look up THRR;Short Term: Able to use daily as guideline for intensity in rehab;Long Term: Able to  use THRR to govern intensity when exercising independently       Able to check pulse independently Yes       Intervention Provide education and demonstration on how to check pulse in carotid and radial arteries.;Review the importance of being able to check your own pulse for safety during independent exercise       Expected Outcomes Short Term: Able to explain why pulse checking is important during independent exercise;Long Term: Able to check pulse independently and accurately       Understanding of Exercise Prescription Yes       Intervention Provide education, explanation, and written materials on patient's individual exercise prescription       Expected Outcomes Short Term: Able to explain program exercise prescription;Long Term: Able to explain home exercise prescription to exercise independently                Exercise Goals Re-Evaluation :   Discharge Exercise Prescription (Final Exercise Prescription Changes):  Exercise Prescription Changes - 03/27/23 1600       Response to Exercise   Blood Pressure (Admit) 122/80    Blood Pressure (Exercise) 154/76    Blood Pressure (Exit) 126/76    Heart Rate (Admit) 60 bpm    Heart Rate (Exercise) 101 bpm    Heart Rate (Exit) 60 bpm    Oxygen Saturation (Admit) 93 %    Oxygen Saturation (Exercise) 94 %    Oxygen Saturation (Exit) 97 %    Rating of Perceived Exertion (Exercise) 15    Perceived Dyspnea (Exercise) 3  Symptoms SOB    Comments results    Duration Progress to 30 minutes of  aerobic without signs/symptoms of physical distress    Intensity THRR New      Progression   Progression Continue to progress workloads to maintain intensity without signs/symptoms of physical distress.    Average METs 1.45             Nutrition:  Target Goals: Understanding of nutrition guidelines, daily intake of sodium 1500mg , cholesterol 200mg , calories 30% from fat and 7% or less from saturated fats, daily to have 5 or more  servings of fruits and vegetables.  Education: All About Nutrition: -Group instruction provided by verbal, written material, interactive activities, discussions, models, and posters to present general guidelines for heart healthy nutrition including fat, fiber, MyPlate, the role of sodium in heart healthy nutrition, utilization of the nutrition label, and utilization of this knowledge for meal planning. Follow up email sent as well. Written material given at graduation.   Biometrics:  Pre Biometrics - 03/27/23 1647       Pre Biometrics   Height 5' 9.2" (1.758 m)    Weight 270 lb 11.2 oz (122.8 kg)    Waist Circumference 56.5 inches    Hip Circumference 55 inches    Waist to Hip Ratio 1.03 %    BMI (Calculated) 39.73    Single Leg Stand 4.5 seconds              Nutrition Therapy Plan and Nutrition Goals:  Nutrition Therapy & Goals - 03/27/23 1650       Nutrition Therapy   RD appointment deferred Yes      Personal Nutrition Goals   Nutrition Goal RD appointment deferred, has another nutrition plan in place      Intervention Plan   Intervention Prescribe, educate and counsel regarding individualized specific dietary modifications aiming towards targeted core components such as weight, hypertension, lipid management, diabetes, heart failure and other comorbidities.;Nutrition handout(s) given to patient.    Expected Outcomes Short Term Goal: Understand basic principles of dietary content, such as calories, fat, sodium, cholesterol and nutrients.;Short Term Goal: A plan has been developed with personal nutrition goals set during dietitian appointment.;Long Term Goal: Adherence to prescribed nutrition plan.             Nutrition Assessments:  MEDIFICTS Score Key: >=70 Need to make dietary changes  40-70 Heart Healthy Diet <= 40 Therapeutic Level Cholesterol Diet  Flowsheet Row Pulmonary Rehab from 03/27/2023 in [facility] Cardiac and Pulmonary Rehab  Picture Your Plate Total  Score on Admission 57      Picture Your Plate Scores: <16 Unhealthy dietary pattern with much room for improvement. 41-50 Dietary pattern unlikely to meet recommendations for good health and room for improvement. 51-60 More healthful dietary pattern, with some room for improvement.  >60 Healthy dietary pattern, although there may be some specific behaviors that could be improved.   Nutrition Goals Re-Evaluation:   Nutrition Goals Discharge (Final Nutrition Goals Re-Evaluation):   Psychosocial: Target Goals: Acknowledge presence or absence of significant depression and/or stress, maximize coping skills, provide positive support system. Participant is able to verbalize types and ability to use techniques and skills needed for reducing stress and depression.   Education: Stress, Anxiety, and Depression - Group verbal and visual presentation to define topics covered.  Reviews how body is impacted by stress, anxiety, and depression.  Also discusses healthy ways to reduce stress and to treat/manage anxiety and depression.  Written  material given at graduation.   Education: Sleep Hygiene -Provides group verbal and written instruction about how sleep can affect your health.  Define sleep hygiene, discuss sleep cycles and impact of sleep habits. Review good sleep hygiene tips.    Initial Review & Psychosocial Screening:  Initial Psych Review & Screening - 03/16/23 1443       Initial Review   Current issues with None Identified      Family Dynamics   Good Support System? Yes   wife     Barriers   Psychosocial barriers to participate in program There are no identifiable barriers or psychosocial needs.;The patient should benefit from training in stress management and relaxation.      Screening Interventions   Interventions Encouraged to exercise;Provide feedback about the scores to participant;To provide support and resources with identified psychosocial needs    Expected Outcomes Short  Term goal: Utilizing psychosocial counselor, staff and physician to assist with identification of specific Stressors or current issues interfering with healing process. Setting desired goal for each stressor or current issue identified.;Long Term Goal: Stressors or current issues are controlled or eliminated.;Short Term goal: Identification and review with participant of any Quality of Life or Depression concerns found by scoring the questionnaire.;Long Term goal: The participant improves quality of Life and PHQ9 Scores as seen by post scores and/or verbalization of changes             Quality of Life Scores:  Scores of 19 and below usually indicate a poorer quality of life in these areas.  A difference of  2-3 points is a clinically meaningful difference.  A difference of 2-3 points in the total score of the Quality of Life Index has been associated with significant improvement in overall quality of life, self-image, physical symptoms, and general health in studies assessing change in quality of life.  PHQ-9: Review Flowsheet       03/27/2023  Depression screen PHQ 2/9  Decreased Interest 0  Down, Depressed, Hopeless 0  PHQ - 2 Score 0  Altered sleeping 1  Tired, decreased energy 1  Change in appetite 0  Feeling bad or failure about yourself  0  Trouble concentrating 0  Moving slowly or fidgety/restless 0  Suicidal thoughts 0  PHQ-9 Score 2  Difficult doing work/chores Not difficult at all   Interpretation of Total Score  Total Score Depression Severity:  1-4 = Minimal depression, 5-9 = Mild depression, 10-14 = Moderate depression, 15-19 = Moderately severe depression, 20-27 = Severe depression   Psychosocial Evaluation and Intervention:  Psychosocial Evaluation - 03/16/23 1445       Psychosocial Evaluation & Interventions   Interventions Encouraged to exercise with the program and follow exercise prescription    Comments Ed is coming to pulmonary rehab with dyspnea. His  main concern is his stamina. He notes that he is unable to walk as far as he once could. He has lost 60 lbs since January 24, with mainly managing diet and potions and with some help from ozempic but had to stop because of GI issues. His wife has her masters in nutrition so she and he have been working on his diet for a while. When asked about stress, he states he doesn't feel stressed but feels very blessed to have his life. He and his wife are going on another cruise next week which he enjoys and looking forward to. He wants to attend the program to work on his stamina and gain some muscle that  he has lost.    Expected Outcomes Short: attend pulmonary rehab for education and exercise. Long; develop and maintain positive self care habits    Continue Psychosocial Services  Follow up required by staff             Psychosocial Re-Evaluation:   Psychosocial Discharge (Final Psychosocial Re-Evaluation):   Education: Education Goals: Education classes will be provided on a weekly basis, covering required topics. Participant will state understanding/return demonstration of topics presented.  Learning Barriers/Preferences:  Learning Barriers/Preferences - 03/16/23 1443       Learning Barriers/Preferences   Learning Barriers None    Learning Preferences None             General Pulmonary Education Topics:  Infection Prevention: - Provides verbal and written material to individual with discussion of infection control including proper hand washing and proper equipment cleaning during exercise session. Flowsheet Row Pulmonary Rehab from 03/27/2023 in [facility] Cardiac and Pulmonary Rehab  Date 03/27/23  Educator MB  Instruction Review Code 1- Verbalizes Understanding       Falls Prevention: - Provides verbal and written material to individual with discussion of falls prevention and safety. Flowsheet Row Pulmonary Rehab from 03/27/2023 in [facility] Cardiac and Pulmonary Rehab  Date 03/27/23   Educator MB  Instruction Review Code 1- Verbalizes Understanding       Chronic Lung Disease Review: - Group verbal instruction with posters, models, PowerPoint presentations and videos,  to review new updates, new respiratory medications, new advancements in procedures and treatments. Providing information on websites and "800" numbers for continued self-education. Includes information about supplement oxygen, available portable oxygen systems, continuous and intermittent flow rates, oxygen safety, concentrators, and Medicare reimbursement for oxygen. Explanation of Pulmonary Drugs, including class, frequency, complications, importance of spacers, rinsing mouth after steroid MDI's, and proper cleaning methods for nebulizers. Review of basic lung anatomy and physiology related to function, structure, and complications of lung disease. Review of risk factors. Discussion about methods for diagnosing sleep apnea and types of masks and machines for OSA. Includes a review of the use of types of environmental controls: home humidity, furnaces, filters, dust mite/pet prevention, HEPA vacuums. Discussion about weather changes, air quality and the benefits of nasal washing. Instruction on Warning signs, infection symptoms, calling MD promptly, preventive modes, and value of vaccinations. Review of effective airway clearance, coughing and/or vibration techniques. Emphasizing that all should Create an Action Plan. Written material given at graduation. Flowsheet Row Pulmonary Rehab from 03/27/2023 in [facility] Cardiac and Pulmonary Rehab  Education need identified 03/27/23       AED/CPR: - Group verbal and written instruction with the use of models to demonstrate the basic use of the AED with the basic ABC's of resuscitation.    Anatomy and Cardiac Procedures: - Group verbal and visual presentation and models provide information about basic cardiac anatomy and function. Reviews the testing methods done to  diagnose heart disease and the outcomes of the test results. Describes the treatment choices: Medical Management, Angioplasty, or Coronary Bypass Surgery for treating various heart conditions including Myocardial Infarction, Angina, Valve Disease, and Cardiac Arrhythmias.  Written material given at graduation.   Medication Safety: - Group verbal and visual instruction to review commonly prescribed medications for heart and lung disease. Reviews the medication, class of the drug, and side effects. Includes the steps to properly store meds and maintain the prescription regimen.  Written material given at graduation.   Other: -Provides group and verbal instruction on various topics (see  comments)   Knowledge Questionnaire Score:  Knowledge Questionnaire Score - 03/27/23 1653       Knowledge Questionnaire Score   Pre Score 17/18              Core Components/Risk Factors/Patient Goals at Admission:  Personal Goals and Risk Factors at Admission - 03/27/23 1653       Core Components/Risk Factors/Patient Goals on Admission    Weight Management Yes;Weight Maintenance;Weight Loss   has lost 60 lbs since January of 24- looking to gain muscle   Intervention Weight Management: Develop a combined nutrition and exercise program designed to reach desired caloric intake, while maintaining appropriate intake of nutrient and fiber, sodium and fats, and appropriate energy expenditure required for the weight goal.;Weight Management: Provide education and appropriate resources to help participant work on and attain dietary goals.;Weight Management/Obesity: Establish reasonable short term and long term weight goals.    Admit Weight 270 lb 11.2 oz (122.8 kg)    Goal Weight: Short Term 270 lb (122.5 kg)    Goal Weight: Long Term 270 lb (122.5 kg)    Expected Outcomes Long Term: Adherence to nutrition and physical activity/exercise program aimed toward attainment of established weight goal;Short Term:  Continue to assess and modify interventions until short term weight is achieved;Understanding recommendations for meals to include 15-35% energy as protein, 25-35% energy from fat, 35-60% energy from carbohydrates, less than 200mg  of dietary cholesterol, 20-35 gm of total fiber daily;Understanding of distribution of calorie intake throughout the day with the consumption of 4-5 meals/snacks;Weight Maintenance: Understanding of the daily nutrition guidelines, which includes 25-35% calories from fat, 7% or less cal from saturated fats, less than 200mg  cholesterol, less than 1.5gm of sodium, & 5 or more servings of fruits and vegetables daily    Diabetes Yes    Intervention Provide education about proper nutrition, including hydration, and aerobic/resistive exercise prescription along with prescribed medications to achieve blood glucose in normal ranges: Fasting glucose 65-99 mg/dL;Provide education about signs/symptoms and action to take for hypo/hyperglycemia.    Expected Outcomes Short Term: Participant verbalizes understanding of the signs/symptoms and immediate care of hyper/hypoglycemia, proper foot care and importance of medication, aerobic/resistive exercise and nutrition plan for blood glucose control.;Long Term: Attainment of HbA1C < 7%.    Hypertension Yes    Intervention Provide education on lifestyle modifcations including regular physical activity/exercise, weight management, moderate sodium restriction and increased consumption of fresh fruit, vegetables, and low fat dairy, alcohol moderation, and smoking cessation.;Monitor prescription use compliance.    Expected Outcomes Short Term: Continued assessment and intervention until BP is < 140/84mm HG in hypertensive participants. < 130/5mm HG in hypertensive participants with diabetes, heart failure or chronic kidney disease.;Long Term: Maintenance of blood pressure at goal levels.             Education:Diabetes - Individual verbal and  written instruction to review signs/symptoms of diabetes, desired ranges of glucose level fasting, after meals and with exercise. Acknowledge that pre and post exercise glucose checks will be done for 3 sessions at entry of program. Flowsheet Row Pulmonary Rehab from 03/27/2023 in [facility] Cardiac and Pulmonary Rehab  Date 03/27/23  Educator MB  Instruction Review Code 1- Verbalizes Understanding       Know Your Numbers and Heart Failure: - Group verbal and visual instruction to discuss disease risk factors for cardiac and pulmonary disease and treatment options.  Reviews associated critical values for Overweight/Obesity, Hypertension, Cholesterol, and Diabetes.  Discusses basics of heart failure: signs/symptoms  and treatments.  Introduces Heart Failure Zone chart for action plan for heart failure.  Written material given at graduation.   Core Components/Risk Factors/Patient Goals Review:    Core Components/Risk Factors/Patient Goals at Discharge (Final Review):    ITP Comments:  ITP Comments     Row Name 03/16/23 1452 03/27/23 1639         ITP Comments Initial phone call completed. Diagnosis can be found in [facility] 11/14. EP Orientation scheduled for Monday 12/16 at 2:30. Completed and gym orientation. Initial ITP created and sent for review to Dr. Jinny Sanders, Medical Director.               Comments: Initial ITP

## 2023-03-27 NOTE — Patient Instructions (Signed)
Patient Instructions  Patient Details  Name: Walter Knox MRN: [id number] Date of Birth: 09/12/1940 Referring Provider:  Vida Rigger, MD  Below are your personal goals for exercise, nutrition, and risk factors. Our goal is to help you stay on track towards obtaining and maintaining these goals. We will be discussing your progress on these goals with you throughout the program.  Initial Exercise Prescription:  Initial Exercise Prescription - 03/27/23 1600       Date of Initial Exercise RX and Referring Provider   Date 03/27/23    Referring Provider Vida Rigger, MD      Oxygen   Maintain Oxygen Saturation 88% or higher      Treadmill   MPH 2    Grade 0    Minutes 15    METs 2.53      Recumbant Bike   Level 1    RPM 50    Watts 15    Minutes 15    METs 1.45      NuStep   Level 1    SPM 80    Minutes 15    METs 1.45      Track   Laps 28    Minutes 15    METs 2.52      Prescription Details   Frequency (times per week) 2    Duration Progress to 30 minutes of continuous aerobic without signs/symptoms of physical distress      Intensity   THRR 40-80% of Max Heartrate 90-121    Ratings of Perceived Exertion 11-13    Perceived Dyspnea 0-4      Progression   Progression Continue to progress workloads to maintain intensity without signs/symptoms of physical distress.      Resistance Training   Training Prescription Yes    Weight 7 lb    Reps 10-15             Exercise Goals: Frequency: Be able to perform aerobic exercise two to three times per week in program working toward 2-5 days per week of home exercise.  Intensity: Work with a perceived exertion of 11 (fairly light) - 15 (hard) while following your exercise prescription.  We will make changes to your prescription with you as you progress through the program.   Duration: Be able to do 30 to 45 minutes of continuous aerobic exercise in addition to a 5 minute warm-up and a 5 minute cool-down  routine.   Nutrition Goals: Your personal nutrition goals will be established when you do your nutrition analysis with the dietician.  The following are general nutrition guidelines to follow: Cholesterol < 200mg /day Sodium < 1500mg /day Fiber: Men over 50 yrs - 30 grams per day  Personal Goals:  Personal Goals and Risk Factors at Admission - 03/27/23 1653       Core Components/Risk Factors/Patient Goals on Admission    Weight Management Yes;Weight Maintenance;Weight Loss   has lost 60 lbs since January of 24- looking to gain muscle   Intervention Weight Management: Develop a combined nutrition and exercise program designed to reach desired caloric intake, while maintaining appropriate intake of nutrient and fiber, sodium and fats, and appropriate energy expenditure required for the weight goal.;Weight Management: Provide education and appropriate resources to help participant work on and attain dietary goals.;Weight Management/Obesity: Establish reasonable short term and long term weight goals.    Admit Weight 270 lb 11.2 oz (122.8 kg)    Goal Weight: Short Term 270 lb (122.5 kg)  Goal Weight: Long Term 270 lb (122.5 kg)    Expected Outcomes Long Term: Adherence to nutrition and physical activity/exercise program aimed toward attainment of established weight goal;Short Term: Continue to assess and modify interventions until short term weight is achieved;Understanding recommendations for meals to include 15-35% energy as protein, 25-35% energy from fat, 35-60% energy from carbohydrates, less than 200mg  of dietary cholesterol, 20-35 gm of total fiber daily;Understanding of distribution of calorie intake throughout the day with the consumption of 4-5 meals/snacks;Weight Maintenance: Understanding of the daily nutrition guidelines, which includes 25-35% calories from fat, 7% or less cal from saturated fats, less than 200mg  cholesterol, less than 1.5gm of sodium, & 5 or more servings of fruits and  vegetables daily    Diabetes Yes    Intervention Provide education about proper nutrition, including hydration, and aerobic/resistive exercise prescription along with prescribed medications to achieve blood glucose in normal ranges: Fasting glucose 65-99 mg/dL;Provide education about signs/symptoms and action to take for hypo/hyperglycemia.    Expected Outcomes Short Term: Participant verbalizes understanding of the signs/symptoms and immediate care of hyper/hypoglycemia, proper foot care and importance of medication, aerobic/resistive exercise and nutrition plan for blood glucose control.;Long Term: Attainment of HbA1C < 7%.    Hypertension Yes    Intervention Provide education on lifestyle modifcations including regular physical activity/exercise, weight management, moderate sodium restriction and increased consumption of fresh fruit, vegetables, and low fat dairy, alcohol moderation, and smoking cessation.;Monitor prescription use compliance.    Expected Outcomes Short Term: Continued assessment and intervention until BP is < 140/39mm HG in hypertensive participants. < 130/8mm HG in hypertensive participants with diabetes, heart failure or chronic kidney disease.;Long Term: Maintenance of blood pressure at goal levels.             Tobacco Use Initial Evaluation: Social History   Tobacco Use  Smoking Status Former   Current packs/day: 0.00   Average packs/day: 2.0 packs/day for 40.0 years (80.0 ttl pk-yrs)   Types: Cigarettes   Start date: 10/09/1961   Quit date: 10/09/2001   Years since quitting: 21.4  Smokeless Tobacco Never    Exercise Goals and Review:  Exercise Goals     Row Name 03/27/23 1646             Exercise Goals   Increase Physical Activity Yes       Intervention Provide advice, education, support and counseling about physical activity/exercise needs.;Develop an individualized exercise prescription for aerobic and resistive training based on initial evaluation  findings, risk stratification, comorbidities and participant's personal goals.       Expected Outcomes Short Term: Attend rehab on a regular basis to increase amount of physical activity.;Long Term: Add in home exercise to make exercise part of routine and to increase amount of physical activity.;Long Term: Exercising regularly at least 3-5 days a week.       Increase Strength and Stamina Yes       Intervention Provide advice, education, support and counseling about physical activity/exercise needs.;Develop an individualized exercise prescription for aerobic and resistive training based on initial evaluation findings, risk stratification, comorbidities and participant's personal goals.       Expected Outcomes Short Term: Increase workloads from initial exercise prescription for resistance, speed, and METs.;Short Term: Perform resistance training exercises routinely during rehab and add in resistance training at home;Long Term: Improve cardiorespiratory fitness, muscular endurance and strength as measured by increased METs and functional capacity ( )       Able to understand and use rate  of perceived exertion (RPE) scale Yes       Intervention Provide education and explanation on how to use RPE scale       Expected Outcomes Short Term: Able to use RPE daily in rehab to express subjective intensity level;Long Term:  Able to use RPE to guide intensity level when exercising independently       Able to understand and use Dyspnea scale Yes       Intervention Provide education and explanation on how to use Dyspnea scale       Expected Outcomes Short Term: Able to use Dyspnea scale daily in rehab to express subjective sense of shortness of breath during exertion;Long Term: Able to use Dyspnea scale to guide intensity level when exercising independently       Knowledge and understanding of Target Heart Rate Range (THRR) Yes       Intervention Provide education and explanation of THRR including how the numbers  were predicted and where they are located for reference       Expected Outcomes Short Term: Able to state/look up THRR;Short Term: Able to use daily as guideline for intensity in rehab;Long Term: Able to use THRR to govern intensity when exercising independently       Able to check pulse independently Yes       Intervention Provide education and demonstration on how to check pulse in carotid and radial arteries.;Review the importance of being able to check your own pulse for safety during independent exercise       Expected Outcomes Short Term: Able to explain why pulse checking is important during independent exercise;Long Term: Able to check pulse independently and accurately       Understanding of Exercise Prescription Yes       Intervention Provide education, explanation, and written materials on patient's individual exercise prescription       Expected Outcomes Short Term: Able to explain program exercise prescription;Long Term: Able to explain home exercise prescription to exercise independently

## 2023-03-29 ENCOUNTER — Encounter: Payer: Self-pay | Admitting: *Deleted

## 2023-03-29 DIAGNOSIS — R0609 Other forms of dyspnea: Secondary | ICD-10-CM

## 2023-03-29 NOTE — Progress Notes (Signed)
Pulmonary Individual Treatment Plan  Patient Details  Name: Walter Knox MRN: [id number] Date of Birth: 1940/08/01 Referring Provider:   Flowsheet Row Pulmonary Rehab from 03/27/2023 in [facility] Cardiac and Pulmonary Rehab  Referring Provider Vida Rigger, MD       Initial Encounter Date:  Flowsheet Row Pulmonary Rehab from 03/27/2023 in [facility] Cardiac and Pulmonary Rehab  Date 03/27/23       Visit Diagnosis: Dyspnea on exertion  Patient's Home Medications on Admission:  Current Outpatient Medications:    acetaminophen (TYLENOL) 500 MG tablet, Take 1,000 mg by mouth 2 (two) times daily., Disp: , Rfl:    amiodarone (PACERONE) 200 MG tablet, Take 200 mg by mouth daily., Disp: , Rfl:    docusate sodium (COLACE) 100 MG capsule, Take 100 mg by mouth 2 (two) times daily., Disp: , Rfl:    dutasteride (AVODART) 0.5 MG capsule, Take 1 capsule (0.5 mg total) by mouth daily., Disp: 90 capsule, Rfl: 3   ELIQUIS 5 MG TABS tablet, Take 5 mg by mouth 2 (two) times daily., Disp: , Rfl:    ezetimibe (ZETIA) 10 MG tablet, Take 10 mg by mouth at bedtime., Disp: , Rfl:    lovastatin (ALTOPREV) 20 MG 24 hr tablet, Take 20 mg by mouth at bedtime., Disp: , Rfl:    metFORMIN (GLUCOPHAGE-XR) 500 MG 24 hr tablet, Take 500 mg by mouth daily with supper., Disp: , Rfl:    metoprolol tartrate (LOPRESSOR) 100 MG tablet, Take 100 mg by mouth 2 (two) times daily., Disp: , Rfl:    Multiple Vitamin (MULTIVITAMIN) tablet, Take 1 tablet by mouth daily., Disp: , Rfl:    omeprazole (PRILOSEC) 20 MG capsule, Take 20 mg by mouth at bedtime. , Disp: , Rfl:    ondansetron (ZOFRAN-ODT) 8 MG disintegrating tablet, Take 1 tablet (8 mg total) by mouth every 8 (eight) hours as needed., Disp: 20 tablet, Rfl: 0   polycarbophil (FIBERCON) 625 MG tablet, Take 1,875 mg by mouth 2 (two) times daily., Disp: , Rfl:    torsemide (DEMADEX) 20 MG tablet, 20 mg once., Disp: , Rfl:   Past Medical History: Past Medical History:  Diagnosis  Date   Anginal pain (HCC)    BPH (benign prostatic hyperplasia)    CAD (coronary artery disease)    Cancer (HCC)    Chronic back pain    Dyspnea    Dysrhythmia    Environmental and seasonal allergies    Hyperlipidemia    Hypertension    Lower extremity edema    Morbid obesity (HCC)    Pre-diabetes    PVD (peripheral vascular disease) (HCC)    SSS (sick sinus syndrome) (HCC)     Tobacco Use: Social History   Tobacco Use  Smoking Status Former   Current packs/day: 0.00   Average packs/day: 2.0 packs/day for 40.0 years (80.0 ttl pk-yrs)   Types: Cigarettes   Start date: 10/09/1961   Quit date: 10/09/2001   Years since quitting: 21.4  Smokeless Tobacco Never    Labs: Review Flowsheet        No data to display           Pulmonary Assessment Scores:  Pulmonary Assessment Scores     Row Name 03/27/23 1647         CAT Score   CAT Score 10       mMRC Score   mMRC Score 3              UCSD: Self-administered  rating of dyspnea associated with activities of daily living (ADLs) 6-point scale (0 = "not at all" to 5 = "maximal or unable to do because of breathlessness")  Scoring Scores range from 0 to 120.  Minimally important difference is 5 units  CAT: CAT can identify the health impairment of COPD patients and is better correlated with disease progression.  CAT has a scoring range of zero to 40. The CAT score is classified into four groups of low (less than 10), medium (10 - 20), high (21-30) and very high (31-40) based on the impact level of disease on health status. A CAT score over 10 suggests significant symptoms.  A worsening CAT score could be explained by an exacerbation, poor medication adherence, poor inhaler technique, or progression of COPD or comorbid conditions.  CAT MCID is 2 points  mMRC: mMRC (Modified Medical Research Council) Dyspnea Scale is used to assess the degree of baseline functional disability in patients of respiratory disease due to  dyspnea. No minimal important difference is established. A decrease in score of 1 point or greater is considered a positive change.   Pulmonary Function Assessment:   Exercise Target Goals: Exercise Program Goal: Individual exercise prescription set using results from initial 6 min walk test and THRR while considering  patient's activity barriers and safety.   Exercise Prescription Goal: Initial exercise prescription builds to 30-45 minutes a day of aerobic activity, 2-3 days per week.  Home exercise guidelines will be given to patient during program as part of exercise prescription that the participant will acknowledge.  Education: Aerobic Exercise: - Group verbal and visual presentation on the components of exercise prescription. Introduces F.I.T.T principle from ACSM for exercise prescriptions.  Reviews F.I.T.T. principles of aerobic exercise including progression. Written material given at graduation.   Education: Resistance Exercise: - Group verbal and visual presentation on the components of exercise prescription. Introduces F.I.T.T principle from ACSM for exercise prescriptions  Reviews F.I.T.T. principles of resistance exercise including progression. Written material given at graduation.    Education: Exercise & Equipment Safety: - Individual verbal instruction and demonstration of equipment use and safety with use of the equipment. Flowsheet Row Pulmonary Rehab from 03/27/2023 in [facility] Cardiac and Pulmonary Rehab  Date 03/27/23  Educator MB  Instruction Review Code 1- Verbalizes Understanding       Education: Exercise Physiology & General Exercise Guidelines: - Group verbal and written instruction with models to review the exercise physiology of the cardiovascular system and associated critical values. Provides general exercise guidelines with specific guidelines to those with heart or lung disease.    Education: Flexibility, Balance, Mind/Body Relaxation: - Group verbal  and visual presentation with interactive activity on the components of exercise prescription. Introduces F.I.T.T principle from ACSM for exercise prescriptions. Reviews F.I.T.T. principles of flexibility and balance exercise training including progression. Also discusses the mind body connection.  Reviews various relaxation techniques to help reduce and manage stress (i.e. Deep breathing, progressive muscle relaxation, and visualization). Balance handout provided to take home. Written material given at graduation.   Activity Barriers & Risk Stratification:  Activity Barriers & Cardiac Risk Stratification - 03/27/23 1641       Activity Barriers & Cardiac Risk Stratification   Activity Barriers Left Knee Replacement;Right Knee Replacement;Deconditioning             6 Minute Walk:  6 Minute Walk     Row Name 03/27/23 1640         6 Minute Walk   Phase Initial  Distance 1185 feet     Walk Time 6 minutes     # of Rest Breaks 0     MPH 2.24     METS 1.45     RPE 5.07     Perceived Dyspnea  3     VO2 Peak 5.07     Symptoms Yes (comment)     Comments SOB     Resting HR 60 bpm     Resting BP 122/80     Resting Oxygen Saturation  93 %     Exercise Oxygen Saturation  during 6 min walk 94 %     Max Ex. HR 101 bpm     Max Ex. BP 154/76     2 Minute Post BP 126/76       Interval HR   1 Minute HR 96     2 Minute HR 101     3 Minute HR 80     4 Minute HR 84     5 Minute HR 84     6 Minute HR 90     2 Minute Post HR 60     Interval Heart Rate? Yes       Interval Oxygen   Interval Oxygen? Yes     Baseline Oxygen Saturation % 93 %     1 Minute Oxygen Saturation % 94 %     1 Minute Liters of Oxygen 0 L     2 Minute Oxygen Saturation % 95 %     2 Minute Liters of Oxygen 0 L     3 Minute Oxygen Saturation % 95 %     3 Minute Liters of Oxygen 0 L     4 Minute Oxygen Saturation % 96 %     4 Minute Liters of Oxygen 0 L     5 Minute Oxygen Saturation % 96 %     5 Minute  Liters of Oxygen 0 L     6 Minute Oxygen Saturation % 96 %     6 Minute Liters of Oxygen 0 L     2 Minute Post Oxygen Saturation % 97 %     2 Minute Post Liters of Oxygen 0 L             Oxygen Initial Assessment:  Oxygen Initial Assessment - 03/16/23 1431       Home Oxygen   Home Oxygen Device None    Sleep Oxygen Prescription None    Home Exercise Oxygen Prescription None    Home Resting Oxygen Prescription None    Compliance with Home Oxygen Use Yes      Intervention   Short Term Goals To learn and understand importance of monitoring SPO2 with pulse oximeter and demonstrate accurate use of the pulse oximeter.;To learn and understand importance of maintaining oxygen saturations>88%;To learn and demonstrate proper pursed lip breathing techniques or other breathing techniques. ;To learn and demonstrate proper use of respiratory medications    Long  Term Goals Verbalizes importance of monitoring SPO2 with pulse oximeter and return demonstration;Maintenance of O2 saturations>88%;Exhibits proper breathing techniques, such as pursed lip breathing or other method taught during program session;Compliance with respiratory medication;Demonstrates proper use of MDI's             Oxygen Re-Evaluation:   Oxygen Discharge (Final Oxygen Re-Evaluation):   Initial Exercise Prescription:  Initial Exercise Prescription - 03/27/23 1600       Date of Initial Exercise RX and Referring Provider  Date 03/27/23    Referring Provider Vida Rigger, MD      Oxygen   Maintain Oxygen Saturation 88% or higher      Treadmill   MPH 2    Grade 0    Minutes 15    METs 2.53      Recumbant Bike   Level 1    RPM 50    Watts 15    Minutes 15    METs 1.45      NuStep   Level 1    SPM 80    Minutes 15    METs 1.45      Track   Laps 28    Minutes 15    METs 2.52      Prescription Details   Frequency (times per week) 2    Duration Progress to 30 minutes of continuous aerobic  without signs/symptoms of physical distress      Intensity   THRR 40-80% of Max Heartrate 90-121    Ratings of Perceived Exertion 11-13    Perceived Dyspnea 0-4      Progression   Progression Continue to progress workloads to maintain intensity without signs/symptoms of physical distress.      Resistance Training   Training Prescription Yes    Weight 7 lb    Reps 10-15             Perform Capillary Blood Glucose checks as needed.  Exercise Prescription Changes:   Exercise Prescription Changes     Row Name 03/27/23 1600             Response to Exercise   Blood Pressure (Admit) 122/80       Blood Pressure (Exercise) 154/76       Blood Pressure (Exit) 126/76       Heart Rate (Admit) 60 bpm       Heart Rate (Exercise) 101 bpm       Heart Rate (Exit) 60 bpm       Oxygen Saturation (Admit) 93 %       Oxygen Saturation (Exercise) 94 %       Oxygen Saturation (Exit) 97 %       Rating of Perceived Exertion (Exercise) 15       Perceived Dyspnea (Exercise) 3       Symptoms SOB       Comments results       Duration Progress to 30 minutes of  aerobic without signs/symptoms of physical distress       Intensity THRR New         Progression   Progression Continue to progress workloads to maintain intensity without signs/symptoms of physical distress.       Average METs 1.45                Exercise Comments:   Exercise Goals and Review:   Exercise Goals     Row Name 03/27/23 1646             Exercise Goals   Increase Physical Activity Yes       Intervention Provide advice, education, support and counseling about physical activity/exercise needs.;Develop an individualized exercise prescription for aerobic and resistive training based on initial evaluation findings, risk stratification, comorbidities and participant's personal goals.       Expected Outcomes Short Term: Attend rehab on a regular basis to increase amount of physical activity.;Long Term: Add  in home exercise to make exercise part of routine and to  increase amount of physical activity.;Long Term: Exercising regularly at least 3-5 days a week.       Increase Strength and Stamina Yes       Intervention Provide advice, education, support and counseling about physical activity/exercise needs.;Develop an individualized exercise prescription for aerobic and resistive training based on initial evaluation findings, risk stratification, comorbidities and participant's personal goals.       Expected Outcomes Short Term: Increase workloads from initial exercise prescription for resistance, speed, and METs.;Short Term: Perform resistance training exercises routinely during rehab and add in resistance training at home;Long Term: Improve cardiorespiratory fitness, muscular endurance and strength as measured by increased METs and functional capacity ( )       Able to understand and use rate of perceived exertion (RPE) scale Yes       Intervention Provide education and explanation on how to use RPE scale       Expected Outcomes Short Term: Able to use RPE daily in rehab to express subjective intensity level;Long Term:  Able to use RPE to guide intensity level when exercising independently       Able to understand and use Dyspnea scale Yes       Intervention Provide education and explanation on how to use Dyspnea scale       Expected Outcomes Short Term: Able to use Dyspnea scale daily in rehab to express subjective sense of shortness of breath during exertion;Long Term: Able to use Dyspnea scale to guide intensity level when exercising independently       Knowledge and understanding of Target Heart Rate Range (THRR) Yes       Intervention Provide education and explanation of THRR including how the numbers were predicted and where they are located for reference       Expected Outcomes Short Term: Able to state/look up THRR;Short Term: Able to use daily as guideline for intensity in rehab;Long Term: Able to  use THRR to govern intensity when exercising independently       Able to check pulse independently Yes       Intervention Provide education and demonstration on how to check pulse in carotid and radial arteries.;Review the importance of being able to check your own pulse for safety during independent exercise       Expected Outcomes Short Term: Able to explain why pulse checking is important during independent exercise;Long Term: Able to check pulse independently and accurately       Understanding of Exercise Prescription Yes       Intervention Provide education, explanation, and written materials on patient's individual exercise prescription       Expected Outcomes Short Term: Able to explain program exercise prescription;Long Term: Able to explain home exercise prescription to exercise independently                Exercise Goals Re-Evaluation :   Discharge Exercise Prescription (Final Exercise Prescription Changes):  Exercise Prescription Changes - 03/27/23 1600       Response to Exercise   Blood Pressure (Admit) 122/80    Blood Pressure (Exercise) 154/76    Blood Pressure (Exit) 126/76    Heart Rate (Admit) 60 bpm    Heart Rate (Exercise) 101 bpm    Heart Rate (Exit) 60 bpm    Oxygen Saturation (Admit) 93 %    Oxygen Saturation (Exercise) 94 %    Oxygen Saturation (Exit) 97 %    Rating of Perceived Exertion (Exercise) 15    Perceived Dyspnea (Exercise) 3  Symptoms SOB    Comments results    Duration Progress to 30 minutes of  aerobic without signs/symptoms of physical distress    Intensity THRR New      Progression   Progression Continue to progress workloads to maintain intensity without signs/symptoms of physical distress.    Average METs 1.45             Nutrition:  Target Goals: Understanding of nutrition guidelines, daily intake of sodium 1500mg , cholesterol 200mg , calories 30% from fat and 7% or less from saturated fats, daily to have 5 or more  servings of fruits and vegetables.  Education: All About Nutrition: -Group instruction provided by verbal, written material, interactive activities, discussions, models, and posters to present general guidelines for heart healthy nutrition including fat, fiber, MyPlate, the role of sodium in heart healthy nutrition, utilization of the nutrition label, and utilization of this knowledge for meal planning. Follow up email sent as well. Written material given at graduation.   Biometrics:  Pre Biometrics - 03/27/23 1647       Pre Biometrics   Height 5' 9.2" (1.758 m)    Weight 270 lb 11.2 oz (122.8 kg)    Waist Circumference 56.5 inches    Hip Circumference 55 inches    Waist to Hip Ratio 1.03 %    BMI (Calculated) 39.73    Single Leg Stand 4.5 seconds              Nutrition Therapy Plan and Nutrition Goals:  Nutrition Therapy & Goals - 03/27/23 1650       Nutrition Therapy   RD appointment deferred Yes      Personal Nutrition Goals   Nutrition Goal RD appointment deferred, has another nutrition plan in place      Intervention Plan   Intervention Prescribe, educate and counsel regarding individualized specific dietary modifications aiming towards targeted core components such as weight, hypertension, lipid management, diabetes, heart failure and other comorbidities.;Nutrition handout(s) given to patient.    Expected Outcomes Short Term Goal: Understand basic principles of dietary content, such as calories, fat, sodium, cholesterol and nutrients.;Short Term Goal: A plan has been developed with personal nutrition goals set during dietitian appointment.;Long Term Goal: Adherence to prescribed nutrition plan.             Nutrition Assessments:  MEDIFICTS Score Key: >=70 Need to make dietary changes  40-70 Heart Healthy Diet <= 40 Therapeutic Level Cholesterol Diet  Flowsheet Row Pulmonary Rehab from 03/27/2023 in [facility] Cardiac and Pulmonary Rehab  Picture Your Plate Total  Score on Admission 57      Picture Your Plate Scores: <56 Unhealthy dietary pattern with much room for improvement. 41-50 Dietary pattern unlikely to meet recommendations for good health and room for improvement. 51-60 More healthful dietary pattern, with some room for improvement.  >60 Healthy dietary pattern, although there may be some specific behaviors that could be improved.   Nutrition Goals Re-Evaluation:   Nutrition Goals Discharge (Final Nutrition Goals Re-Evaluation):   Psychosocial: Target Goals: Acknowledge presence or absence of significant depression and/or stress, maximize coping skills, provide positive support system. Participant is able to verbalize types and ability to use techniques and skills needed for reducing stress and depression.   Education: Stress, Anxiety, and Depression - Group verbal and visual presentation to define topics covered.  Reviews how body is impacted by stress, anxiety, and depression.  Also discusses healthy ways to reduce stress and to treat/manage anxiety and depression.  Written  material given at graduation.   Education: Sleep Hygiene -Provides group verbal and written instruction about how sleep can affect your health.  Define sleep hygiene, discuss sleep cycles and impact of sleep habits. Review good sleep hygiene tips.    Initial Review & Psychosocial Screening:  Initial Psych Review & Screening - 03/16/23 1443       Initial Review   Current issues with None Identified      Family Dynamics   Good Support System? Yes   wife     Barriers   Psychosocial barriers to participate in program There are no identifiable barriers or psychosocial needs.;The patient should benefit from training in stress management and relaxation.      Screening Interventions   Interventions Encouraged to exercise;Provide feedback about the scores to participant;To provide support and resources with identified psychosocial needs    Expected Outcomes Short  Term goal: Utilizing psychosocial counselor, staff and physician to assist with identification of specific Stressors or current issues interfering with healing process. Setting desired goal for each stressor or current issue identified.;Long Term Goal: Stressors or current issues are controlled or eliminated.;Short Term goal: Identification and review with participant of any Quality of Life or Depression concerns found by scoring the questionnaire.;Long Term goal: The participant improves quality of Life and PHQ9 Scores as seen by post scores and/or verbalization of changes             Quality of Life Scores:  Scores of 19 and below usually indicate a poorer quality of life in these areas.  A difference of  2-3 points is a clinically meaningful difference.  A difference of 2-3 points in the total score of the Quality of Life Index has been associated with significant improvement in overall quality of life, self-image, physical symptoms, and general health in studies assessing change in quality of life.  PHQ-9: Review Flowsheet       03/27/2023  Depression screen PHQ 2/9  Decreased Interest 0  Down, Depressed, Hopeless 0  PHQ - 2 Score 0  Altered sleeping 1  Tired, decreased energy 1  Change in appetite 0  Feeling bad or failure about yourself  0  Trouble concentrating 0  Moving slowly or fidgety/restless 0  Suicidal thoughts 0  PHQ-9 Score 2  Difficult doing work/chores Not difficult at all   Interpretation of Total Score  Total Score Depression Severity:  1-4 = Minimal depression, 5-9 = Mild depression, 10-14 = Moderate depression, 15-19 = Moderately severe depression, 20-27 = Severe depression   Psychosocial Evaluation and Intervention:  Psychosocial Evaluation - 03/16/23 1445       Psychosocial Evaluation & Interventions   Interventions Encouraged to exercise with the program and follow exercise prescription    Comments Ed is coming to pulmonary rehab with dyspnea. His  main concern is his stamina. He notes that he is unable to walk as far as he once could. He has lost 60 lbs since January 24, with mainly managing diet and potions and with some help from ozempic but had to stop because of GI issues. His wife has her masters in nutrition so she and he have been working on his diet for a while. When asked about stress, he states he doesn't feel stressed but feels very blessed to have his life. He and his wife are going on another cruise next week which he enjoys and looking forward to. He wants to attend the program to work on his stamina and gain some muscle that  he has lost.    Expected Outcomes Short: attend pulmonary rehab for education and exercise. Long; develop and maintain positive self care habits    Continue Psychosocial Services  Follow up required by staff             Psychosocial Re-Evaluation:   Psychosocial Discharge (Final Psychosocial Re-Evaluation):   Education: Education Goals: Education classes will be provided on a weekly basis, covering required topics. Participant will state understanding/return demonstration of topics presented.  Learning Barriers/Preferences:  Learning Barriers/Preferences - 03/16/23 1443       Learning Barriers/Preferences   Learning Barriers None    Learning Preferences None             General Pulmonary Education Topics:  Infection Prevention: - Provides verbal and written material to individual with discussion of infection control including proper hand washing and proper equipment cleaning during exercise session. Flowsheet Row Pulmonary Rehab from 03/27/2023 in [facility] Cardiac and Pulmonary Rehab  Date 03/27/23  Educator MB  Instruction Review Code 1- Verbalizes Understanding       Falls Prevention: - Provides verbal and written material to individual with discussion of falls prevention and safety. Flowsheet Row Pulmonary Rehab from 03/27/2023 in [facility] Cardiac and Pulmonary Rehab  Date 03/27/23   Educator MB  Instruction Review Code 1- Verbalizes Understanding       Chronic Lung Disease Review: - Group verbal instruction with posters, models, PowerPoint presentations and videos,  to review new updates, new respiratory medications, new advancements in procedures and treatments. Providing information on websites and "800" numbers for continued self-education. Includes information about supplement oxygen, available portable oxygen systems, continuous and intermittent flow rates, oxygen safety, concentrators, and Medicare reimbursement for oxygen. Explanation of Pulmonary Drugs, including class, frequency, complications, importance of spacers, rinsing mouth after steroid MDI's, and proper cleaning methods for nebulizers. Review of basic lung anatomy and physiology related to function, structure, and complications of lung disease. Review of risk factors. Discussion about methods for diagnosing sleep apnea and types of masks and machines for OSA. Includes a review of the use of types of environmental controls: home humidity, furnaces, filters, dust mite/pet prevention, HEPA vacuums. Discussion about weather changes, air quality and the benefits of nasal washing. Instruction on Warning signs, infection symptoms, calling MD promptly, preventive modes, and value of vaccinations. Review of effective airway clearance, coughing and/or vibration techniques. Emphasizing that all should Create an Action Plan. Written material given at graduation. Flowsheet Row Pulmonary Rehab from 03/27/2023 in [facility] Cardiac and Pulmonary Rehab  Education need identified 03/27/23       AED/CPR: - Group verbal and written instruction with the use of models to demonstrate the basic use of the AED with the basic ABC's of resuscitation.    Anatomy and Cardiac Procedures: - Group verbal and visual presentation and models provide information about basic cardiac anatomy and function. Reviews the testing methods done to  diagnose heart disease and the outcomes of the test results. Describes the treatment choices: Medical Management, Angioplasty, or Coronary Bypass Surgery for treating various heart conditions including Myocardial Infarction, Angina, Valve Disease, and Cardiac Arrhythmias.  Written material given at graduation.   Medication Safety: - Group verbal and visual instruction to review commonly prescribed medications for heart and lung disease. Reviews the medication, class of the drug, and side effects. Includes the steps to properly store meds and maintain the prescription regimen.  Written material given at graduation.   Other: -Provides group and verbal instruction on various topics (see  comments)   Knowledge Questionnaire Score:  Knowledge Questionnaire Score - 03/27/23 1653       Knowledge Questionnaire Score   Pre Score 17/18              Core Components/Risk Factors/Patient Goals at Admission:  Personal Goals and Risk Factors at Admission - 03/27/23 1653       Core Components/Risk Factors/Patient Goals on Admission    Weight Management Yes;Weight Maintenance;Weight Loss   has lost 60 lbs since January of 24- looking to gain muscle   Intervention Weight Management: Develop a combined nutrition and exercise program designed to reach desired caloric intake, while maintaining appropriate intake of nutrient and fiber, sodium and fats, and appropriate energy expenditure required for the weight goal.;Weight Management: Provide education and appropriate resources to help participant work on and attain dietary goals.;Weight Management/Obesity: Establish reasonable short term and long term weight goals.    Admit Weight 270 lb 11.2 oz (122.8 kg)    Goal Weight: Short Term 270 lb (122.5 kg)    Goal Weight: Long Term 270 lb (122.5 kg)    Expected Outcomes Long Term: Adherence to nutrition and physical activity/exercise program aimed toward attainment of established weight goal;Short Term:  Continue to assess and modify interventions until short term weight is achieved;Understanding recommendations for meals to include 15-35% energy as protein, 25-35% energy from fat, 35-60% energy from carbohydrates, less than 200mg  of dietary cholesterol, 20-35 gm of total fiber daily;Understanding of distribution of calorie intake throughout the day with the consumption of 4-5 meals/snacks;Weight Maintenance: Understanding of the daily nutrition guidelines, which includes 25-35% calories from fat, 7% or less cal from saturated fats, less than 200mg  cholesterol, less than 1.5gm of sodium, & 5 or more servings of fruits and vegetables daily    Diabetes Yes    Intervention Provide education about proper nutrition, including hydration, and aerobic/resistive exercise prescription along with prescribed medications to achieve blood glucose in normal ranges: Fasting glucose 65-99 mg/dL;Provide education about signs/symptoms and action to take for hypo/hyperglycemia.    Expected Outcomes Short Term: Participant verbalizes understanding of the signs/symptoms and immediate care of hyper/hypoglycemia, proper foot care and importance of medication, aerobic/resistive exercise and nutrition plan for blood glucose control.;Long Term: Attainment of HbA1C < 7%.    Hypertension Yes    Intervention Provide education on lifestyle modifcations including regular physical activity/exercise, weight management, moderate sodium restriction and increased consumption of fresh fruit, vegetables, and low fat dairy, alcohol moderation, and smoking cessation.;Monitor prescription use compliance.    Expected Outcomes Short Term: Continued assessment and intervention until BP is < 140/38mm HG in hypertensive participants. < 130/35mm HG in hypertensive participants with diabetes, heart failure or chronic kidney disease.;Long Term: Maintenance of blood pressure at goal levels.             Education:Diabetes - Individual verbal and  written instruction to review signs/symptoms of diabetes, desired ranges of glucose level fasting, after meals and with exercise. Acknowledge that pre and post exercise glucose checks will be done for 3 sessions at entry of program. Flowsheet Row Pulmonary Rehab from 03/27/2023 in [facility] Cardiac and Pulmonary Rehab  Date 03/27/23  Educator MB  Instruction Review Code 1- Verbalizes Understanding       Know Your Numbers and Heart Failure: - Group verbal and visual instruction to discuss disease risk factors for cardiac and pulmonary disease and treatment options.  Reviews associated critical values for Overweight/Obesity, Hypertension, Cholesterol, and Diabetes.  Discusses basics of heart failure: signs/symptoms  and treatments.  Introduces Heart Failure Zone chart for action plan for heart failure.  Written material given at graduation.   Core Components/Risk Factors/Patient Goals Review:    Core Components/Risk Factors/Patient Goals at Discharge (Final Review):    ITP Comments:  ITP Comments     Row Name 03/16/23 1452 03/27/23 1639 03/29/23 0936       ITP Comments Initial phone call completed. Diagnosis can be found in [facility] 11/14. EP Orientation scheduled for Monday 12/16 at 2:30. Completed and gym orientation. Initial ITP created and sent for review to Dr. Jinny Sanders, Medical Director. 30 Day review completed. Medical Director ITP review done, changes made as directed, and signed approval by Medical Director.    new to program              Comments:

## 2023-04-02 ENCOUNTER — Other Ambulatory Visit: Payer: Self-pay | Admitting: Medical Genetics

## 2023-04-04 ENCOUNTER — Encounter: Payer: Medicare Other | Admitting: *Deleted

## 2023-04-04 DIAGNOSIS — R0600 Dyspnea, unspecified: Secondary | ICD-10-CM | POA: Diagnosis not present

## 2023-04-04 DIAGNOSIS — R0609 Other forms of dyspnea: Secondary | ICD-10-CM

## 2023-04-04 LAB — GLUCOSE, CAPILLARY
Glucose-Capillary: 131 mg/dL — ABNORMAL HIGH (ref 70–99)
Glucose-Capillary: 123 mg/dL — ABNORMAL HIGH (ref 70–99)

## 2023-04-04 NOTE — Progress Notes (Signed)
Daily Session Note  Patient Details  Name: Walter Knox MRN: [id number] Date of Birth: [birth year] Referring Provider:   Flowsheet Row Pulmonary Rehab from 03/27/2023 in [facility] Cardiac and Pulmonary Rehab  Referring Provider Vida Rigger, MD       Encounter Date: 04/04/2023  Check In:  Session Check In - 04/04/23 0933       Check-In   Supervising physician immediately available to respond to emergencies See telemetry face sheet for immediately available ER MD    Location [facility]-Cardiac & Pulmonary Rehab    Staff Present Cora Collum, RN, BSN, CCRP;Kristen Coble, RN,BC,MSN;Jason Wallace Cullens, RDN, Luiz Iron, BS, Exercise Physiologist;Margaret Best, MS, Exercise Physiologist    Virtual Visit No    Medication changes reported     No    Fall or balance concerns reported    No    Warm-up and Cool-down Performed on first and last piece of equipment    Resistance Training Performed Yes    VAD Patient? No    PAD/SET Patient? No      Pain Assessment   Currently in Pain? No/denies                Social History   Tobacco Use  Smoking Status Former   Current packs/day: 0.00   Average packs/day: 2.0 packs/day for 40.0 years (80.0 ttl pk-yrs)   Types: Cigarettes   Start date: 10/09/1961   Quit date: 10/09/2001   Years since quitting: 21.4  Smokeless Tobacco Never    Goals Met:  Proper associated with RPD/PD & O2 Sat Exercise tolerated well Personal goals reviewed No report of concerns or symptoms today  Goals Unmet:  Not Applicable  Comments: First full day of exercise!  Patient was oriented to gym and equipment including functions, settings, policies, and procedures.  Patient's individual exercise prescription and treatment plan were reviewed.  All starting workloads were established based on the results of the 6 minute walk test done at initial orientation visit.  The plan for exercise progression was also introduced and progression will be customized based on patient's  performance and goals.    Dr. Bethann Punches is Medical Director for [facility] Cardiac Rehabilitation.  Dr. Vida Rigger is Medical Director for [facility] Pulmonary Rehabilitation.

## 2023-04-06 ENCOUNTER — Encounter: Payer: Medicare Other | Admitting: *Deleted

## 2023-04-06 DIAGNOSIS — R0600 Dyspnea, unspecified: Secondary | ICD-10-CM | POA: Diagnosis not present

## 2023-04-06 DIAGNOSIS — R0609 Other forms of dyspnea: Secondary | ICD-10-CM

## 2023-04-06 LAB — GLUCOSE, CAPILLARY
Glucose-Capillary: 122 mg/dL — ABNORMAL HIGH (ref 70–99)
Glucose-Capillary: 154 mg/dL — ABNORMAL HIGH (ref 70–99)

## 2023-04-06 NOTE — Progress Notes (Signed)
Daily Session Note  Patient Details  Name: Walter Knox MRN: [id number] Date of Birth: 1940-06-08 Referring Provider:   Flowsheet Row Pulmonary Rehab from 03/27/2023 in [facility] Cardiac and Pulmonary Rehab  Referring Provider Vida Rigger, MD       Encounter Date: 04/06/2023  Check In:  Session Check In - 04/06/23 0926       Check-In   Supervising physician immediately available to respond to emergencies See telemetry face sheet for immediately available ER MD    Location [facility]-Cardiac & Pulmonary Rehab    Staff Present Cora Collum, RN, BSN, CCRP;Joseph Riggins, Guinevere Ferrari, RN, [place]    Virtual Visit No    Medication changes reported     No    Fall or balance concerns reported    No    Warm-up and Cool-down Performed on first and last piece of equipment    Resistance Training Performed Yes    VAD Patient? No    PAD/SET Patient? No      Pain Assessment   Currently in Pain? No/denies                Social History   Tobacco Use  Smoking Status Former   Current packs/day: 0.00   Average packs/day: 2.0 packs/day for 40.0 years (80.0 ttl pk-yrs)   Types: Cigarettes   Start date: 10/09/1961   Quit date: 10/09/2001   Years since quitting: 21.5  Smokeless Tobacco Never    Goals Met:  Independence with exercise equipment Exercise tolerated well No report of concerns or symptoms today Strength training completed today  Goals Unmet:  Not Applicable  Comments: Pt able to follow exercise prescription today without complaint.  Will continue to monitor for progression.    Dr. Bethann Punches is Medical Director for [facility] Cardiac Rehabilitation.  Dr. Vida Rigger is Medical Director for [facility] Pulmonary Rehabilitation.

## 2023-04-11 ENCOUNTER — Encounter: Payer: Medicare Other | Admitting: *Deleted

## 2023-04-11 DIAGNOSIS — R0609 Other forms of dyspnea: Secondary | ICD-10-CM

## 2023-04-11 DIAGNOSIS — R0600 Dyspnea, unspecified: Secondary | ICD-10-CM | POA: Diagnosis not present

## 2023-04-11 LAB — GLUCOSE, CAPILLARY
Glucose-Capillary: 137 mg/dL — ABNORMAL HIGH (ref 70–99)
Glucose-Capillary: 129 mg/dL — ABNORMAL HIGH (ref 70–99)

## 2023-04-11 NOTE — Progress Notes (Signed)
 Daily Session Note  Patient Details  Name: DIARRA KOS MRN: [id number] Date of Birth: [birth year] Referring Provider:   Flowsheet Row Pulmonary Rehab from 03/27/2023 in [facility] Cardiac and Pulmonary Rehab  Referring Provider Parris Manna, MD       Encounter Date: 04/11/2023  Check In:  Session Check In - 04/11/23 0949       Check-In   Supervising physician immediately available to respond to emergencies See telemetry face sheet for immediately available ER MD    Location [facility]-Cardiac & Pulmonary Rehab    Staff Present Rollene Paterson, MS, Exercise Physiologist;Tinesha Siegrist, RN, BSN, CCRP;Noah Tickle, BS, Exercise Physiologist;Maxon Conetta BS, , Exercise Physiologist    Virtual Visit No    Medication changes reported     No    Fall or balance concerns reported    No    Warm-up and Cool-down Performed on first and last piece of equipment    Resistance Training Performed Yes    VAD Patient? No    PAD/SET Patient? No      Pain Assessment   Currently in Pain? No/denies                Social History   Tobacco Use  Smoking Status Former   Current packs/day: 0.00   Average packs/day: 2.0 packs/day for 40.0 years (80.0 ttl pk-yrs)   Types: Cigarettes   Start date: 10/09/1961   Quit date: 10/09/2001   Years since quitting: 21.5  Smokeless Tobacco Never    Goals Met:  Proper associated with RPD/PD & O2 Sat Independence with exercise equipment Exercise tolerated well No report of concerns or symptoms today  Goals Unmet:  Not Applicable  Comments: Pt able to follow exercise prescription today without complaint.  Will continue to monitor for progression.    Dr. Oneil Pinal is Medical Director for [facility] Cardiac Rehabilitation.  Dr. Fuad Aleskerov is Medical Director for [facility] Pulmonary Rehabilitation.

## 2023-04-13 ENCOUNTER — Encounter: Payer: Medicare Other | Attending: Pulmonary Disease | Admitting: *Deleted

## 2023-04-13 DIAGNOSIS — R0609 Other forms of dyspnea: Secondary | ICD-10-CM | POA: Insufficient documentation

## 2023-04-13 NOTE — Progress Notes (Signed)
 Daily Session Note  Patient Details  Name: Walter Knox MRN: [id number] Date of Birth: May 30, 1940 Referring Provider:   Flowsheet Row Pulmonary Rehab from 03/27/2023 in [facility] Cardiac and Pulmonary Rehab  Referring Provider Parris Manna, MD       Encounter Date: 04/13/2023  Check In:  Session Check In - 04/13/23 1114       Check-In   Supervising physician immediately available to respond to emergencies See telemetry face sheet for immediately available ER MD    Location [facility]-Cardiac & Pulmonary Rehab    Staff Present Rollene Paterson, MS, Exercise Physiologist;Meredith Tressa, RN BSN;Maxon Conetta BS, , Exercise Physiologist;Magnus Crescenzo Claudene, RN, ADN    Virtual Visit No    Medication changes reported     No    Fall or balance concerns reported    No    Warm-up and Cool-down Performed on first and last piece of equipment    Resistance Training Performed Yes    VAD Patient? No    PAD/SET Patient? No      Pain Assessment   Currently in Pain? No/denies                Social History   Tobacco Use  Smoking Status Former   Current packs/day: 0.00   Average packs/day: 2.0 packs/day for 40.0 years (80.0 ttl pk-yrs)   Types: Cigarettes   Start date: 10/09/1961   Quit date: 10/09/2001   Years since quitting: 21.5  Smokeless Tobacco Never    Goals Met:  Independence with exercise equipment Exercise tolerated well No report of concerns or symptoms today Strength training completed today  Goals Unmet:  Not Applicable  Comments: Pt able to follow exercise prescription today without complaint.  Will continue to monitor for progression.    Dr. Oneil Pinal is Medical Director for [facility] Cardiac Rehabilitation.  Dr. Fuad Aleskerov is Medical Director for [facility] Pulmonary Rehabilitation.

## 2023-04-18 ENCOUNTER — Encounter: Payer: Medicare Other | Admitting: *Deleted

## 2023-04-18 DIAGNOSIS — R0609 Other forms of dyspnea: Secondary | ICD-10-CM

## 2023-04-18 NOTE — Progress Notes (Signed)
 Daily Session Note  Patient Details  Name: Walter Knox MRN: [id number] Date of Birth: [birth year] Referring Provider:   Flowsheet Row Pulmonary Rehab from 03/27/2023 in [facility] Cardiac and Pulmonary Rehab  Referring Provider Parris Manna, MD       Encounter Date: 04/18/2023  Check In:  Session Check In - 04/18/23 1119       Check-In   Supervising physician immediately available to respond to emergencies See telemetry face sheet for immediately available ER MD    Location [facility]-Cardiac & Pulmonary Rehab    Staff Present Othel Durand, RN, BSN, CCRP;Meredith Tressa, RN BSN;Maxon Conetta BS, , Exercise Physiologist    Virtual Visit No    Medication changes reported     No    Fall or balance concerns reported    No    Warm-up and Cool-down Performed on first and last piece of equipment    Resistance Training Performed Yes    VAD Patient? No    PAD/SET Patient? No      Pain Assessment   Currently in Pain? No/denies                Social History   Tobacco Use  Smoking Status Former   Current packs/day: 0.00   Average packs/day: 2.0 packs/day for 40.0 years (80.0 ttl pk-yrs)   Types: Cigarettes   Start date: 10/09/1961   Quit date: 10/09/2001   Years since quitting: 21.5  Smokeless Tobacco Never    Goals Met:  Proper associated with RPD/PD & O2 Sat Independence with exercise equipment Exercise tolerated well No report of concerns or symptoms today  Goals Unmet:  Not Applicable  Comments: Pt able to follow exercise prescription today without complaint.  Will continue to monitor for progression.    Dr. Oneil Pinal is Medical Director for [facility] Cardiac Rehabilitation.  Dr. Fuad Aleskerov is Medical Director for [facility] Pulmonary Rehabilitation.

## 2023-04-20 ENCOUNTER — Encounter: Payer: Medicare Other | Admitting: *Deleted

## 2023-04-20 DIAGNOSIS — R0609 Other forms of dyspnea: Secondary | ICD-10-CM | POA: Diagnosis not present

## 2023-04-20 NOTE — Progress Notes (Signed)
 Daily Session Note  Patient Details  Name: XYON LUKASIK MRN: [id number] Date of Birth: 08/29/1940 Referring Provider:   Flowsheet Row Pulmonary Rehab from 03/27/2023 in [facility] Cardiac and Pulmonary Rehab  Referring Provider Parris Manna, MD       Encounter Date: 04/20/2023  Check In:  Session Check In - 04/20/23 0937       Check-In   Supervising physician immediately available to respond to emergencies See telemetry face sheet for immediately available ER MD    Location [facility]-Cardiac & Pulmonary Rehab    Staff Present Othel Durand, RN, BSN, CCRP;Joseph Hood, RCP,RRT,BSRT;Maxon Dodson BS, , Exercise Physiologist;Lachell Rochette Claudene, RN, [place]    Virtual Visit No    Medication changes reported     No    Fall or balance concerns reported    No    Warm-up and Cool-down Performed on first and last piece of equipment    Resistance Training Performed Yes    VAD Patient? No    PAD/SET Patient? No      Pain Assessment   Currently in Pain? No/denies                Social History   Tobacco Use  Smoking Status Former   Current packs/day: 0.00   Average packs/day: 2.0 packs/day for 40.0 years (80.0 ttl pk-yrs)   Types: Cigarettes   Start date: 10/09/1961   Quit date: 10/09/2001   Years since quitting: 21.5  Smokeless Tobacco Never    Goals Met:  Independence with exercise equipment Exercise tolerated well No report of concerns or symptoms today Strength training completed today  Goals Unmet:  Not Applicable  Comments: Pt able to follow exercise prescription today without complaint.  Will continue to monitor for progression.    Dr. Oneil Pinal is Medical Director for [facility] Cardiac Rehabilitation.  Dr. Fuad Aleskerov is Medical Director for [facility] Pulmonary Rehabilitation.

## 2023-04-25 ENCOUNTER — Encounter: Payer: Medicare Other | Admitting: *Deleted

## 2023-04-25 DIAGNOSIS — R0609 Other forms of dyspnea: Secondary | ICD-10-CM | POA: Diagnosis not present

## 2023-04-25 NOTE — Progress Notes (Signed)
 Daily Session Note  Patient Details  Name: Walter Knox MRN: [id number] Date of Birth: [birth year] Referring Provider:   Flowsheet Row Pulmonary Rehab from 03/27/2023 in [facility] Cardiac and Pulmonary Rehab  Referring Provider Parris Manna, MD       Encounter Date: 04/25/2023  Check In:  Session Check In - 04/25/23 0945       Check-In   Supervising physician immediately available to respond to emergencies See telemetry face sheet for immediately available ER MD    Location [facility]-Cardiac & Pulmonary Rehab    Staff Present Othel Durand, RN, BSN, CCRP;Margaret Best, MS, Exercise Physiologist;Krista Jacques RN, BSN;Maxon Conetta BS, , Exercise Physiologist    Virtual Visit No    Medication changes reported     No    Fall or balance concerns reported    No    Warm-up and Cool-down Performed on first and last piece of equipment    Resistance Training Performed Yes    VAD Patient? No    PAD/SET Patient? No      Pain Assessment   Currently in Pain? No/denies                Social History   Tobacco Use  Smoking Status Former   Current packs/day: 0.00   Average packs/day: 2.0 packs/day for 40.0 years (80.0 ttl pk-yrs)   Types: Cigarettes   Start date: 10/09/1961   Quit date: 10/09/2001   Years since quitting: 21.5  Smokeless Tobacco Never    Goals Met:  Proper associated with RPD/PD & O2 Sat Independence with exercise equipment Exercise tolerated well No report of concerns or symptoms today  Goals Unmet:  Not Applicable  Comments: Pt able to follow exercise prescription today without complaint.  Will continue to monitor for progression.    Dr. Oneil Pinal is Medical Director for [facility] Cardiac Rehabilitation.  Dr. Fuad Aleskerov is Medical Director for [facility] Pulmonary Rehabilitation.

## 2023-04-26 ENCOUNTER — Encounter: Payer: Self-pay | Admitting: *Deleted

## 2023-04-26 DIAGNOSIS — R0609 Other forms of dyspnea: Secondary | ICD-10-CM

## 2023-04-26 NOTE — Progress Notes (Signed)
 30 Day review completed. Medical Director ITP review done, changes made as directed, and signed approval by Medical Director. ? ?

## 2023-04-26 NOTE — Progress Notes (Signed)
 Pulmonary Individual Treatment Plan  Patient Details  Name: Walter Knox MRN: [id number] Date of Birth: 22-Aug-1940 Referring Provider:   Flowsheet Row Pulmonary Rehab from 03/27/2023 in [facility] Cardiac and Pulmonary Rehab  Referring Provider Erskin Hearing, MD       Initial Encounter Date:  Flowsheet Row Pulmonary Rehab from 03/27/2023 in [facility] Cardiac and Pulmonary Rehab  Date 03/27/23       Visit Diagnosis: Dyspnea on exertion  Patient's Home Medications on Admission:  Current Outpatient Medications:    acetaminophen  (TYLENOL ) 500 MG tablet, Take 1,000 mg by mouth 2 (two) times daily., Disp: , Rfl:    amiodarone  (PACERONE ) 200 MG tablet, Take 200 mg by mouth daily., Disp: , Rfl:    docusate sodium (COLACE) 100 MG capsule, Take 100 mg by mouth 2 (two) times daily., Disp: , Rfl:    dutasteride  (AVODART ) 0.5 MG capsule, Take 1 capsule (0.5 mg total) by mouth daily., Disp: 90 capsule, Rfl: 3   ELIQUIS  5 MG TABS tablet, Take 5 mg by mouth 2 (two) times daily., Disp: , Rfl:    ezetimibe  (ZETIA ) 10 MG tablet, Take 10 mg by mouth at bedtime., Disp: , Rfl:    lovastatin  (ALTOPREV ) 20 MG 24 hr tablet, Take 20 mg by mouth at bedtime., Disp: , Rfl:    metFORMIN (GLUCOPHAGE-XR) 500 MG 24 hr tablet, Take 500 mg by mouth daily with supper., Disp: , Rfl:    metoprolol  tartrate (LOPRESSOR ) 100 MG tablet, Take 100 mg by mouth 2 (two) times daily., Disp: , Rfl:    Multiple Vitamin (MULTIVITAMIN) tablet, Take 1 tablet by mouth daily., Disp: , Rfl:    omeprazole (PRILOSEC) 20 MG capsule, Take 20 mg by mouth at bedtime. , Disp: , Rfl:    ondansetron  (ZOFRAN -ODT) 8 MG disintegrating tablet, Take 1 tablet (8 mg total) by mouth every 8 (eight) hours as needed., Disp: 20 tablet, Rfl: 0   polycarbophil (FIBERCON) 625 MG tablet, Take 1,875 mg by mouth 2 (two) times daily., Disp: , Rfl:    torsemide (DEMADEX) 20 MG tablet, 20 mg once., Disp: , Rfl:   Past Medical History: Past Medical History:  Diagnosis  Date   Anginal pain (HCC)    BPH (benign prostatic hyperplasia)    CAD (coronary artery disease)    Cancer (HCC)    Chronic back pain    Dyspnea    Dysrhythmia    Environmental and seasonal allergies    Hyperlipidemia    Hypertension    Lower extremity edema    Morbid obesity (HCC)    Pre-diabetes    PVD (peripheral vascular disease) (HCC)    SSS (sick sinus syndrome) (HCC)     Tobacco Use: Social History   Tobacco Use  Smoking Status Former   Current packs/day: 0.00   Average packs/day: 2.0 packs/day for 40.0 years (80.0 ttl pk-yrs)   Types: Cigarettes   Start date: 10/09/1961   Quit date: 10/09/2001   Years since quitting: 21.5  Smokeless Tobacco Never    Labs: Review Flowsheet        No data to display           Pulmonary Assessment Scores:  Pulmonary Assessment Scores     Row Name 03/27/23 1647 04/04/23 0927       ADL UCSD   ADL Phase -- Entry    SOB Score total -- 32    Rest -- 0    Walk -- 0  Starts at 0 and becomes 4  Stairs -- 4    Bath -- 0    Dress -- 0    Shop -- 1      CAT Score   CAT Score 10 --      mMRC Score   mMRC Score 3 --             UCSD: Self-administered rating of dyspnea associated with activities of daily living (ADLs) 6-point scale (0 = "not at all" to 5 = "maximal or unable to do because of breathlessness")  Scoring Scores range from 0 to 120.  Minimally important difference is 5 units  CAT: CAT can identify the health impairment of COPD patients and is better correlated with disease progression.  CAT has a scoring range of zero to 40. The CAT score is classified into four groups of low (less than 10), medium (10 - 20), high (21-30) and very high (31-40) based on the impact level of disease on health status. A CAT score over 10 suggests significant symptoms.  A worsening CAT score could be explained by an exacerbation, poor medication adherence, poor inhaler technique, or progression of COPD or comorbid conditions.   CAT MCID is 2 points  mMRC: mMRC (Modified Medical Research Council) Dyspnea Scale is used to assess the degree of baseline functional disability in patients of respiratory disease due to dyspnea. No minimal important difference is established. A decrease in score of 1 point or greater is considered a positive change.   Pulmonary Function Assessment:   Exercise Target Goals: Exercise Program Goal: Individual exercise prescription set using results from initial 6 min walk test and THRR while considering  patient's activity barriers and safety.   Exercise Prescription Goal: Initial exercise prescription builds to 30-45 minutes a day of aerobic activity, 2-3 days per week.  Home exercise guidelines will be given to patient during program as part of exercise prescription that the participant will acknowledge.  Education: Aerobic Exercise: - Group verbal and visual presentation on the components of exercise prescription. Introduces F.I.T.T principle from ACSM for exercise prescriptions.  Reviews F.I.T.T. principles of aerobic exercise including progression. Written material given at graduation.   Education: Resistance Exercise: - Group verbal and visual presentation on the components of exercise prescription. Introduces F.I.T.T principle from ACSM for exercise prescriptions  Reviews F.I.T.T. principles of resistance exercise including progression. Written material given at graduation. Flowsheet Row Pulmonary Rehab from 04/13/2023 in [facility] Cardiac and Pulmonary Rehab  Date 04/13/23  Educator MB  Instruction Review Code 1- [employer] Understanding        Education: Exercise & Equipment Safety: - Individual verbal instruction and demonstration of equipment use and safety with use of the equipment. Flowsheet Row Pulmonary Rehab from 04/13/2023 in [facility] Cardiac and Pulmonary Rehab  Date 03/27/23  Educator MB  Instruction Review Code 1- Verbalizes Understanding       Education: Exercise  Physiology & General Exercise Guidelines: - Group verbal and written instruction with models to review the exercise physiology of the cardiovascular system and associated critical values. Provides general exercise guidelines with specific guidelines to those with heart or lung disease.    Education: Flexibility, Balance, Mind/Body Relaxation: - Group verbal and visual presentation with interactive activity on the components of exercise prescription. Introduces F.I.T.T principle from ACSM for exercise prescriptions. Reviews F.I.T.T. principles of flexibility and balance exercise training including progression. Also discusses the mind body connection.  Reviews various relaxation techniques to help reduce and manage stress (i.e. Deep breathing, progressive muscle relaxation, and visualization). Balance  handout provided to take home. Written material given at graduation. Flowsheet Row Pulmonary Rehab from 04/13/2023 in [facility] Cardiac and Pulmonary Rehab  Date 04/13/23  Educator MB  Instruction Review Code 1- Verbalizes Understanding       Activity Barriers & Risk Stratification:  Activity Barriers & Cardiac Risk Stratification - 03/27/23 1641       Activity Barriers & Cardiac Risk Stratification   Activity Barriers Left Knee Replacement;Right Knee Replacement;Deconditioning             6 Minute Walk:  6 Minute Walk     Row Name 03/27/23 1640         6 Minute Walk   Phase Initial     Distance 1185 feet     Walk Time 6 minutes     # of Rest Breaks 0     MPH 2.24     METS 1.45     RPE 5.07     Perceived Dyspnea  3     VO2 Peak 5.07     Symptoms Yes (comment)     Comments SOB     Resting HR 60 bpm     Resting BP 122/80     Resting Oxygen Saturation  93 %     Exercise Oxygen Saturation  during 6 min walk 94 %     Max Ex. HR 101 bpm     Max Ex. BP 154/76     2 Minute Post BP 126/76       Interval HR   1 Minute HR 96     2 Minute HR 101     3 Minute HR 80     4 Minute HR 84      5 Minute HR 84     6 Minute HR 90     2 Minute Post HR 60     Interval Heart Rate? Yes       Interval Oxygen   Interval Oxygen? Yes     Baseline Oxygen Saturation % 93 %     1 Minute Oxygen Saturation % 94 %     1 Minute Liters of Oxygen 0 L     2 Minute Oxygen Saturation % 95 %     2 Minute Liters of Oxygen 0 L     3 Minute Oxygen Saturation % 95 %     3 Minute Liters of Oxygen 0 L     4 Minute Oxygen Saturation % 96 %     4 Minute Liters of Oxygen 0 L     5 Minute Oxygen Saturation % 96 %     5 Minute Liters of Oxygen 0 L     6 Minute Oxygen Saturation % 96 %     6 Minute Liters of Oxygen 0 L     2 Minute Post Oxygen Saturation % 97 %     2 Minute Post Liters of Oxygen 0 L             Oxygen Initial Assessment:  Oxygen Initial Assessment - 03/16/23 1431       Home Oxygen   Home Oxygen Device None    Sleep Oxygen Prescription None    Home Exercise Oxygen Prescription None    Home Resting Oxygen Prescription None    Compliance with Home Oxygen Use Yes      Intervention   Short Term Goals To learn and understand importance of monitoring SPO2 with pulse oximeter and demonstrate accurate use of  the pulse oximeter.;To learn and understand importance of maintaining oxygen saturations>88%;To learn and demonstrate proper pursed lip breathing techniques or other breathing techniques. ;To learn and demonstrate proper use of respiratory medications    Long  Term Goals Verbalizes importance of monitoring SPO2 with pulse oximeter and return demonstration;Maintenance of O2 saturations>88%;Exhibits proper breathing techniques, such as pursed lip breathing or other method taught during program session;Compliance with respiratory medication;Demonstrates proper use of MDI's             Oxygen Re-Evaluation:  Oxygen Re-Evaluation     Row Name 04/04/23 0934             Program Oxygen Prescription   Program Oxygen Prescription None         Home Oxygen   Home Oxygen  Device None       Sleep Oxygen Prescription None       Home Exercise Oxygen Prescription None       Home Resting Oxygen Prescription None       Compliance with Home Oxygen Use Yes         Goals/Expected Outcomes   Short Term Goals To learn and demonstrate proper pursed lip breathing techniques or other breathing techniques.        Long  Term Goals Exhibits proper breathing techniques, such as pursed lip breathing or other method taught during program session       Comments Reviewed PLB technique with pt.  Talked about how it works and it's importance in maintaining their exercise saturations.       Goals/Expected Outcomes Short: Become more profiecient at using PLB. Long: Become independent at using PLB.                Oxygen Discharge (Final Oxygen Re-Evaluation):  Oxygen Re-Evaluation - 04/04/23 0934       Program Oxygen Prescription   Program Oxygen Prescription None      Home Oxygen   Home Oxygen Device None    Sleep Oxygen Prescription None    Home Exercise Oxygen Prescription None    Home Resting Oxygen Prescription None    Compliance with Home Oxygen Use Yes      Goals/Expected Outcomes   Short Term Goals To learn and demonstrate proper pursed lip breathing techniques or other breathing techniques.     Long  Term Goals Exhibits proper breathing techniques, such as pursed lip breathing or other method taught during program session    Comments Reviewed PLB technique with pt.  Talked about how it works and it's importance in maintaining their exercise saturations.    Goals/Expected Outcomes Short: Become more profiecient at using PLB. Long: Become independent at using PLB.             Initial Exercise Prescription:  Initial Exercise Prescription - 03/27/23 1600       Date of Initial Exercise RX and Referring Provider   Date 03/27/23    Referring Provider Erskin Hearing, MD      Oxygen   Maintain Oxygen Saturation 88% or higher      Treadmill   MPH 2     Grade 0    Minutes 15    METs 2.53      Recumbant Bike   Level 1    RPM 50    Watts 15    Minutes 15    METs 1.45      NuStep   Level 1    SPM 80    Minutes 15  METs 1.45      Track   Laps 28    Minutes 15    METs 2.52      Prescription Details   Frequency (times per week) 2    Duration Progress to 30 minutes of continuous aerobic without signs/symptoms of physical distress      Intensity   THRR 40-80% of Max Heartrate 90-121    Ratings of Perceived Exertion 11-13    Perceived Dyspnea 0-4      Progression   Progression Continue to progress workloads to maintain intensity without signs/symptoms of physical distress.      Resistance Training   Training Prescription Yes    Weight 7 lb    Reps 10-15             Perform Capillary Blood Glucose checks as needed.  Exercise Prescription Changes:   Exercise Prescription Changes     Row Name 03/27/23 1600 04/19/23 1300           Response to Exercise   Blood Pressure (Admit) 122/80 120/80      Blood Pressure (Exercise) 154/76 142/68      Blood Pressure (Exit) 126/76 106/62      Heart Rate (Admit) 60 bpm 60 bpm      Heart Rate (Exercise) 101 bpm 95 bpm      Heart Rate (Exit) 60 bpm 81 bpm      Oxygen Saturation (Admit) 93 % 93 %      Oxygen Saturation (Exercise) 94 % 93 %      Oxygen Saturation (Exit) 97 % 96 %      Rating of Perceived Exertion (Exercise) 15 15      Perceived Dyspnea (Exercise) 3 2      Symptoms SOB --      Comments results --      Duration Progress to 30 minutes of  aerobic without signs/symptoms of physical distress Progress to 30 minutes of  aerobic without signs/symptoms of physical distress      Intensity THRR New THRR unchanged        Progression   Progression Continue to progress workloads to maintain intensity without signs/symptoms of physical distress. Continue to progress workloads to maintain intensity without signs/symptoms of physical distress.      Average METs 1.45  3.5        Resistance Training   Training Prescription -- Yes      Weight -- 7 lb      Reps -- 10-15        Interval Training   Interval Training -- No        Treadmill   MPH -- 2      Grade -- 0      Minutes -- 15      METs -- 2.53        NuStep   Level -- 4      Minutes -- 15      METs -- 5.6        Oxygen   Maintain Oxygen Saturation -- 88% or higher               Exercise Comments:   Exercise Comments     Row Name 04/04/23 5284           Exercise Comments First full day of exercise!  Patient was oriented to gym and equipment including functions, settings, policies, and procedures.  Patient's individual exercise prescription and treatment plan were reviewed.  All starting  workloads were established based on the results of the 6 minute walk test done at initial orientation visit.  The plan for exercise progression was also introduced and progression will be customized based on patient's performance and goals.                Exercise Goals and Review:   Exercise Goals     Row Name 03/27/23 1646             Exercise Goals   Increase Physical Activity Yes       Intervention Provide advice, education, support and counseling about physical activity/exercise needs.;Develop an individualized exercise prescription for aerobic and resistive training based on initial evaluation findings, risk stratification, comorbidities and participant's personal goals.       Expected Outcomes Short Term: Attend rehab on a regular basis to increase amount of physical activity.;Long Term: Add in home exercise to make exercise part of routine and to increase amount of physical activity.;Long Term: Exercising regularly at least 3-5 days a week.       Increase Strength and Stamina Yes       Intervention Provide advice, education, support and counseling about physical activity/exercise needs.;Develop an individualized exercise prescription for aerobic and resistive training based on  initial evaluation findings, risk stratification, comorbidities and participant's personal goals.       Expected Outcomes Short Term: Increase workloads from initial exercise prescription for resistance, speed, and METs.;Short Term: Perform resistance training exercises routinely during rehab and add in resistance training at home;Long Term: Improve cardiorespiratory fitness, muscular endurance and strength as measured by increased METs and functional capacity ( )       Able to understand and use rate of perceived exertion (RPE) scale Yes       Intervention Provide education and explanation on how to use RPE scale       Expected Outcomes Short Term: Able to use RPE daily in rehab to express subjective intensity level;Long Term:  Able to use RPE to guide intensity level when exercising independently       Able to understand and use Dyspnea scale Yes       Intervention Provide education and explanation on how to use Dyspnea scale       Expected Outcomes Short Term: Able to use Dyspnea scale daily in rehab to express subjective sense of shortness of breath during exertion;Long Term: Able to use Dyspnea scale to guide intensity level when exercising independently       Knowledge and understanding of Target Heart Rate Range (THRR) Yes       Intervention Provide education and explanation of THRR including how the numbers were predicted and where they are located for reference       Expected Outcomes Short Term: Able to state/look up THRR;Short Term: Able to use daily as guideline for intensity in rehab;Long Term: Able to use THRR to govern intensity when exercising independently       Able to check pulse independently Yes       Intervention Provide education and demonstration on how to check pulse in carotid and radial arteries.;Review the importance of being able to check your own pulse for safety during independent exercise       Expected Outcomes Short Term: Able to explain why pulse checking is  important during independent exercise;Long Term: Able to check pulse independently and accurately       Understanding of Exercise Prescription Yes       Intervention Provide education, explanation, and  written materials on patient's individual exercise prescription       Expected Outcomes Short Term: Able to explain program exercise prescription;Long Term: Able to explain home exercise prescription to exercise independently                Exercise Goals Re-Evaluation :  Exercise Goals Re-Evaluation     Row Name 04/04/23 0935 04/19/23 1357           Exercise Goal Re-Evaluation   Exercise Goals Review Able to understand and use rate of perceived exertion (RPE) scale;Knowledge and understanding of Target Heart Rate Range (THRR);Understanding of Exercise Prescription;Able to understand and use Dyspnea scale Increase Physical Activity;Increase Strength and Stamina;Understanding of Exercise Prescription      Comments Reviewed RPE and dyspnea scale, THR and program prescription with pt today.  Pt voiced understanding and was given a copy of goals to take home. Ed is off to a great start in the program. He has attended his first 4 sessions of the program, and has been able to increase to level 4 on the T4 nustep. He is also off to a great start on the treadmill at a workload of and no incline. We will continue to monitor his progress in the program.      Expected Outcomes Short: Use RPE daily to regulate intensity. Long: Follow program prescription in THR. Short: Continue to dollow exercise prescription and increase workloads when able. Long: Continue exercise to improve strength and stamina.               Discharge Exercise Prescription (Final Exercise Prescription Changes):  Exercise Prescription Changes - 04/19/23 1300       Response to Exercise   Blood Pressure (Admit) 120/80    Blood Pressure (Exercise) 142/68    Blood Pressure (Exit) 106/62    Heart Rate (Admit) 60 bpm     Heart Rate (Exercise) 95 bpm    Heart Rate (Exit) 81 bpm    Oxygen Saturation (Admit) 93 %    Oxygen Saturation (Exercise) 93 %    Oxygen Saturation (Exit) 96 %    Rating of Perceived Exertion (Exercise) 15    Perceived Dyspnea (Exercise) 2    Duration Progress to 30 minutes of  aerobic without signs/symptoms of physical distress    Intensity THRR unchanged      Progression   Progression Continue to progress workloads to maintain intensity without signs/symptoms of physical distress.    Average METs 3.5      Resistance Training   Training Prescription Yes    Weight 7 lb    Reps 10-15      Interval Training   Interval Training No      Treadmill   MPH 2    Grade 0    Minutes 15    METs 2.53      NuStep   Level 4    Minutes 15    METs 5.6      Oxygen   Maintain Oxygen Saturation 88% or higher             Nutrition:  Target Goals: Understanding of nutrition guidelines, daily intake of sodium 1500mg , cholesterol 200mg , calories 30% from fat and 7% or less from saturated fats, daily to have 5 or more servings of fruits and vegetables.  Education: All About Nutrition: -Group instruction provided by verbal, written material, interactive activities, discussions, models, and posters to present general guidelines for heart healthy nutrition including fat, fiber, MyPlate, the role  of sodium in heart healthy nutrition, utilization of the nutrition label, and utilization of this knowledge for meal planning. Follow up email sent as well. Written material given at graduation.   Biometrics:  Pre Biometrics - 03/27/23 1647       Pre Biometrics   Height 5' 9.2" (1.758 m)    Weight 270 lb 11.2 oz (122.8 kg)    Waist Circumference 56.5 inches    Hip Circumference 55 inches    Waist to Hip Ratio 1.03 %    BMI (Calculated) 39.73    Single Leg Stand 4.5 seconds              Nutrition Therapy Plan and Nutrition Goals:  Nutrition Therapy & Goals - 03/27/23 1650        Nutrition Therapy   RD appointment deferred Yes      Personal Nutrition Goals   Nutrition Goal RD appointment deferred, has another nutrition plan in place      Intervention Plan   Intervention Prescribe, educate and counsel regarding individualized specific dietary modifications aiming towards targeted core components such as weight, hypertension, lipid management, diabetes, heart failure and other comorbidities.;Nutrition handout(s) given to patient.    Expected Outcomes Short Term Goal: Understand basic principles of dietary content, such as calories, fat, sodium, cholesterol and nutrients.;Short Term Goal: A plan has been developed with personal nutrition goals set during dietitian appointment.;Long Term Goal: Adherence to prescribed nutrition plan.             Nutrition Assessments:  MEDIFICTS Score Key: >=70 Need to make dietary changes  40-70 Heart Healthy Diet <= 40 Therapeutic Level Cholesterol Diet  Flowsheet Row Pulmonary Rehab from 03/27/2023 in [facility] Cardiac and Pulmonary Rehab  Picture Your Plate Total Score on Admission 57      Picture Your Plate Scores: <16 Unhealthy dietary pattern with much room for improvement. 41-50 Dietary pattern unlikely to meet recommendations for good health and room for improvement. 51-60 More healthful dietary pattern, with some room for improvement.  >60 Healthy dietary pattern, although there may be some specific behaviors that could be improved.   Nutrition Goals Re-Evaluation:  Nutrition Goals Re-Evaluation     Row Name 04/20/23 0927             Goals   Current Weight 278 lb (126.1 kg)       Comment Patient was informed on why it is important to maintain a balanced diet when dealing with Respiratory issues. Explained that it takes a lot of energy to breath and when they are short of breath often they will need to have a good diet to help keep up with the calories they are expending for breathing.       Expected Outcome  Short: Choose and plan snacks accordingly to patients caloric intake to improve breathing. Long: Maintain a diet independently that meets their caloric intake to aid in daily shortness of breath.                Nutrition Goals Discharge (Final Nutrition Goals Re-Evaluation):  Nutrition Goals Re-Evaluation - 04/20/23 0927       Goals   Current Weight 278 lb (126.1 kg)    Comment Patient was informed on why it is important to maintain a balanced diet when dealing with Respiratory issues. Explained that it takes a lot of energy to breath and when they are short of breath often they will need to have a good diet to help keep up with  the calories they are expending for breathing.    Expected Outcome Short: Choose and plan snacks accordingly to patients caloric intake to improve breathing. Long: Maintain a diet independently that meets their caloric intake to aid in daily shortness of breath.             Psychosocial: Target Goals: Acknowledge presence or absence of significant depression and/or stress, maximize coping skills, provide positive support system. Participant is able to verbalize types and ability to use techniques and skills needed for reducing stress and depression.   Education: Stress, Anxiety, and Depression - Group verbal and visual presentation to define topics covered.  Reviews how body is impacted by stress, anxiety, and depression.  Also discusses healthy ways to reduce stress and to treat/manage anxiety and depression.  Written material given at graduation.   Education: Sleep Hygiene -Provides group verbal and written instruction about how sleep can affect your health.  Define sleep hygiene, discuss sleep cycles and impact of sleep habits. Review good sleep hygiene tips.    Initial Review & Psychosocial Screening:  Initial Psych Review & Screening - 03/16/23 1443       Initial Review   Current issues with None Identified      Family Dynamics   Good Support  System? Yes   wife     Barriers   Psychosocial barriers to participate in program There are no identifiable barriers or psychosocial needs.;The patient should benefit from training in stress management and relaxation.      Screening Interventions   Interventions Encouraged to exercise;Provide feedback about the scores to participant;To provide support and resources with identified psychosocial needs    Expected Outcomes Short Term goal: Utilizing psychosocial counselor, staff and physician to assist with identification of specific Stressors or current issues interfering with healing process. Setting desired goal for each stressor or current issue identified.;Long Term Goal: Stressors or current issues are controlled or eliminated.;Short Term goal: Identification and review with participant of any Quality of Life or Depression concerns found by scoring the questionnaire.;Long Term goal: The participant improves quality of Life and PHQ9 Scores as seen by post scores and/or verbalization of changes             Quality of Life Scores:  Scores of 19 and below usually indicate a poorer quality of life in these areas.  A difference of  2-3 points is a clinically meaningful difference.  A difference of 2-3 points in the total score of the Quality of Life Index has been associated with significant improvement in overall quality of life, self-image, physical symptoms, and general health in studies assessing change in quality of life.  PHQ-9: Review Flowsheet       03/27/2023  Depression screen PHQ 2/9  Decreased Interest 0  Down, Depressed, Hopeless 0  PHQ - 2 Score 0  Altered sleeping 1  Tired, decreased energy 1  Change in appetite 0  Feeling bad or failure about yourself  0  Trouble concentrating 0  Moving slowly or fidgety/restless 0  Suicidal thoughts 0  PHQ-9 Score 2  Difficult doing work/chores Not difficult at all   Interpretation of Total Score  Total Score Depression Severity:   1-4 = Minimal depression, 5-9 = Mild depression, 10-14 = Moderate depression, 15-19 = Moderately severe depression, 20-27 = Severe depression   Psychosocial Evaluation and Intervention:  Psychosocial Evaluation - 03/16/23 1445       Psychosocial Evaluation & Interventions   Interventions Encouraged to exercise with the program  and follow exercise prescription    Comments Ed is coming to pulmonary rehab with dyspnea. His main concern is his stamina. He notes that he is unable to walk as far as he once could. He has lost 60 lbs since January 24, with mainly managing diet and potions and with some help from ozempic but had to stop because of GI issues. His wife has her masters in nutrition so she and he have been working on his diet for a while. When asked about stress, he states he doesn't feel stressed but feels very blessed to have his life. He and his wife are going on another cruise next week which he enjoys and looking forward to. He wants to attend the program to work on his stamina and gain some muscle that he has lost.    Expected Outcomes Short: attend pulmonary rehab for education and exercise. Long; develop and maintain positive self care habits    Continue Psychosocial Services  Follow up required by staff             Psychosocial Re-Evaluation:  Psychosocial Re-Evaluation     Row Name 04/20/23 0930             Psychosocial Re-Evaluation   Current issues with None Identified       Comments Patient reports no issues with their current mental states, sleep, stress, depression or anxiety. Will follow up with patient in a few weeks for any changes.       Expected Outcomes Short: Continue to exercise regularly to support mental health and notify staff of any changes. Long: maintain mental health and well being through teaching of rehab or prescribed medications independently.       Interventions Encouraged to attend Pulmonary Rehabilitation for the exercise       Continue  Psychosocial Services  Follow up required by staff                Psychosocial Discharge (Final Psychosocial Re-Evaluation):  Psychosocial Re-Evaluation - 04/20/23 0930       Psychosocial Re-Evaluation   Current issues with None Identified    Comments Patient reports no issues with their current mental states, sleep, stress, depression or anxiety. Will follow up with patient in a few weeks for any changes.    Expected Outcomes Short: Continue to exercise regularly to support mental health and notify staff of any changes. Long: maintain mental health and well being through teaching of rehab or prescribed medications independently.    Interventions Encouraged to attend Pulmonary Rehabilitation for the exercise    Continue Psychosocial Services  Follow up required by staff             Education: Education Goals: Education classes will be provided on a weekly basis, covering required topics. Participant will state understanding/return demonstration of topics presented.  Learning Barriers/Preferences:  Learning Barriers/Preferences - 03/16/23 1443       Learning Barriers/Preferences   Learning Barriers None    Learning Preferences None             General Pulmonary Education Topics:  Infection Prevention: - Provides verbal and written material to individual with discussion of infection control including proper hand washing and proper equipment cleaning during exercise session. Flowsheet Row Pulmonary Rehab from 04/13/2023 in [facility] Cardiac and Pulmonary Rehab  Date 03/27/23  Educator MB  Instruction Review Code 1- Verbalizes Understanding       Falls Prevention: - Provides verbal and written material to individual with discussion of  falls prevention and safety. Flowsheet Row Pulmonary Rehab from 04/13/2023 in [facility] Cardiac and Pulmonary Rehab  Date 03/27/23  Educator MB  Instruction Review Code 1- Verbalizes Understanding       Chronic Lung Disease Review: - Group  verbal instruction with posters, models, PowerPoint presentations and videos,  to review new updates, new respiratory medications, new advancements in procedures and treatments. Providing information on websites and "800" numbers for continued self-education. Includes information about supplement oxygen, available portable oxygen systems, continuous and intermittent flow rates, oxygen safety, concentrators, and Medicare reimbursement for oxygen. Explanation of Pulmonary Drugs, including class, frequency, complications, importance of spacers, rinsing mouth after steroid MDI's, and proper cleaning methods for nebulizers. Review of basic lung anatomy and physiology related to function, structure, and complications of lung disease. Review of risk factors. Discussion about methods for diagnosing sleep apnea and types of masks and machines for OSA. Includes a review of the use of types of environmental controls: home humidity, furnaces, filters, dust mite/pet prevention, HEPA vacuums. Discussion about weather changes, air quality and the benefits of nasal washing. Instruction on Warning signs, infection symptoms, calling MD promptly, preventive modes, and value of vaccinations. Review of effective airway clearance, coughing and/or vibration techniques. Emphasizing that all should Create an Action Plan. Written material given at graduation. Flowsheet Row Pulmonary Rehab from 04/13/2023 in [facility] Cardiac and Pulmonary Rehab  Education need identified 03/27/23       AED/CPR: - Group verbal and written instruction with the use of models to demonstrate the basic use of the AED with the basic ABC's of resuscitation.    Anatomy and Cardiac Procedures: - Group verbal and visual presentation and models provide information about basic cardiac anatomy and function. Reviews the testing methods done to diagnose heart disease and the outcomes of the test results. Describes the treatment choices: Medical Management,  Angioplasty, or Coronary Bypass Surgery for treating various heart conditions including Myocardial Infarction, Angina, Valve Disease, and Cardiac Arrhythmias.  Written material given at graduation.   Medication Safety: - Group verbal and visual instruction to review commonly prescribed medications for heart and lung disease. Reviews the medication, class of the drug, and side effects. Includes the steps to properly store meds and maintain the prescription regimen.  Written material given at graduation.   Other: -Provides group and verbal instruction on various topics (see comments)   Knowledge Questionnaire Score:  Knowledge Questionnaire Score - 03/27/23 1653       Knowledge Questionnaire Score   Pre Score 17/18              Core Components/Risk Factors/Patient Goals at Admission:  Personal Goals and Risk Factors at Admission - 03/27/23 1653       Core Components/Risk Factors/Patient Goals on Admission    Weight Management Yes;Weight Maintenance;Weight Loss   has lost 60 lbs since January of 24- looking to gain muscle   Intervention Weight Management: Develop a combined nutrition and exercise program designed to reach desired caloric intake, while maintaining appropriate intake of nutrient and fiber, sodium and fats, and appropriate energy expenditure required for the weight goal.;Weight Management: Provide education and appropriate resources to help participant work on and attain dietary goals.;Weight Management/Obesity: Establish reasonable short term and long term weight goals.    Admit Weight 270 lb 11.2 oz (122.8 kg)    Goal Weight: Short Term 270 lb (122.5 kg)    Goal Weight: Long Term 270 lb (122.5 kg)    Expected Outcomes Long Term: Adherence to nutrition  and physical activity/exercise program aimed toward attainment of established weight goal;Short Term: Continue to assess and modify interventions until short term weight is achieved;Understanding recommendations for meals  to include 15-35% energy as protein, 25-35% energy from fat, 35-60% energy from carbohydrates, less than 200mg  of dietary cholesterol, 20-35 gm of total fiber daily;Understanding of distribution of calorie intake throughout the day with the consumption of 4-5 meals/snacks;Weight Maintenance: Understanding of the daily nutrition guidelines, which includes 25-35% calories from fat, 7% or less cal from saturated fats, less than 200mg  cholesterol, less than 1.5gm of sodium, & 5 or more servings of fruits and vegetables daily    Diabetes Yes    Intervention Provide education about proper nutrition, including hydration, and aerobic/resistive exercise prescription along with prescribed medications to achieve blood glucose in normal ranges: Fasting glucose 65-99 mg/dL;Provide education about signs/symptoms and action to take for hypo/hyperglycemia.    Expected Outcomes Short Term: Participant verbalizes understanding of the signs/symptoms and immediate care of hyper/hypoglycemia, proper foot care and importance of medication, aerobic/resistive exercise and nutrition plan for blood glucose control.;Long Term: Attainment of HbA1C < 7%.    Hypertension Yes    Intervention Provide education on lifestyle modifcations including regular physical activity/exercise, weight management, moderate sodium restriction and increased consumption of fresh fruit, vegetables, and low fat dairy, alcohol moderation, and smoking cessation.;Monitor prescription use compliance.    Expected Outcomes Short Term: Continued assessment and intervention until BP is < 140/67mm HG in hypertensive participants. < 130/74mm HG in hypertensive participants with diabetes, heart failure or chronic kidney disease.;Long Term: Maintenance of blood pressure at goal levels.             Education:Diabetes - Individual verbal and written instruction to review signs/symptoms of diabetes, desired ranges of glucose level fasting, after meals and with  exercise. Acknowledge that pre and post exercise glucose checks will be done for 3 sessions at entry of program. Flowsheet Row Pulmonary Rehab from 04/13/2023 in [facility] Cardiac and Pulmonary Rehab  Date 03/27/23  Educator MB  Instruction Review Code 1- Verbalizes Understanding       Know Your Numbers and Heart Failure: - Group verbal and visual instruction to discuss disease risk factors for cardiac and pulmonary disease and treatment options.  Reviews associated critical values for Overweight/Obesity, Hypertension, Cholesterol, and Diabetes.  Discusses basics of heart failure: signs/symptoms and treatments.  Introduces Heart Failure Zone chart for action plan for heart failure.  Written material given at graduation.   Core Components/Risk Factors/Patient Goals Review:   Goals and Risk Factor Review     Row Name 04/20/23 0926             Core Components/Risk Factors/Patient Goals Review   Personal Goals Review Improve shortness of breath with ADL's       Review Spoke to patient about their shortness of breath and what they can do to improve. Patient has been informed of breathing techniques when starting the program. Patient is informed to tell staff if they have had any med changes and that certain meds they are taking or not taking can be causing shortness of breath.       Expected Outcomes Short: Attend [facility] regularly to improve shortness of breath with ADL's. Long: maintain independence with ADL's                Core Components/Risk Factors/Patient Goals at Discharge (Final Review):   Goals and Risk Factor Review - 04/20/23 0926       Core Components/Risk  Factors/Patient Goals Review   Personal Goals Review Improve shortness of breath with ADL's    Review Spoke to patient about their shortness of breath and what they can do to improve. Patient has been informed of breathing techniques when starting the program. Patient is informed to tell staff if they have had any med  changes and that certain meds they are taking or not taking can be causing shortness of breath.    Expected Outcomes Short: Attend [facility] regularly to improve shortness of breath with ADL's. Long: maintain independence with ADL's             ITP Comments:  ITP Comments     Row Name 03/16/23 1452 03/27/23 1639 03/29/23 0936 04/04/23 0937 04/26/23 1243   ITP Comments Initial phone call completed. Diagnosis can be found in [facility] 11/14. EP Orientation scheduled for Monday 12/16 at 2:30. Completed and gym orientation. Initial ITP created and sent for review to Dr. Faud Aleskerov, Medical Director. 30 Day review completed. Medical Director ITP review done, changes made as directed, and signed approval by Medical Director.    new to program First full day of exercise!  Patient was oriented to gym and equipment including functions, settings, policies, and procedures.  Patient's individual exercise prescription and treatment plan were reviewed.  All starting workloads were established based on the results of the 6 minute walk test done at initial orientation visit.  The plan for exercise progression was also introduced and progression will be customized based on patient's performance and goals. 30 Day review completed. Medical Director ITP review done, changes made as directed, and signed approval by Medical Director.   new to program            Comments:

## 2023-04-27 ENCOUNTER — Encounter: Payer: Medicare Other | Admitting: *Deleted

## 2023-04-27 DIAGNOSIS — R0609 Other forms of dyspnea: Secondary | ICD-10-CM | POA: Diagnosis not present

## 2023-04-27 NOTE — Progress Notes (Signed)
Daily Session Note  Patient Details  Name: DENISE GMEREK MRN: [id number] Date of Birth: 1941/03/24 Referring Provider:   Flowsheet Row Pulmonary Rehab from 03/27/2023 in [facility] Cardiac and Pulmonary Rehab  Referring Provider Vida Rigger, MD       Encounter Date: 04/27/2023  Check In:  Session Check In - 04/27/23 1005       Check-In   Supervising physician immediately available to respond to emergencies See telemetry face sheet for immediately available ER MD    Location [facility]-Cardiac & Pulmonary Rehab    Staff Present Cora Collum, RN, BSN, CCRP;Meredith Jewel Baize RN,BSN;[employer], MS, Exercise Physiologist    Virtual Visit No    Medication changes reported     No    Fall or balance concerns reported    No    Warm-up and Cool-down Performed on first and last piece of equipment    Resistance Training Performed Yes    VAD Patient? No    PAD/SET Patient? No      Pain Assessment   Currently in Pain? No/denies                Social History   Tobacco Use  Smoking Status Former   Current packs/day: 0.00   Average packs/day: 2.0 packs/day for 40.0 years (80.0 ttl pk-yrs)   Types: Cigarettes   Start date: 10/09/1961   Quit date: 10/09/2001   Years since quitting: 21.5  Smokeless Tobacco Never    Goals Met:  Proper associated with RPD/PD & O2 Sat Independence with exercise equipment Exercise tolerated well No report of concerns or symptoms today  Goals Unmet:  Not Applicable  Comments: Pt able to follow exercise prescription today without complaint.  Will continue to monitor for progression.    Dr. Bethann Punches is Medical Director for [facility] Cardiac Rehabilitation.  Dr. Vida Rigger is Medical Director for [facility] Pulmonary Rehabilitation.

## 2023-05-02 DIAGNOSIS — R0609 Other forms of dyspnea: Secondary | ICD-10-CM | POA: Diagnosis not present

## 2023-05-02 NOTE — Progress Notes (Signed)
Daily Session Note  Patient Details  Name: Walter Knox MRN: [id number] Date of Birth: 1941-02-22 Referring Provider:   Flowsheet Row Pulmonary Rehab from 03/27/2023 in [facility] Cardiac and Pulmonary Rehab  Referring Provider Vida Rigger, MD       Encounter Date: 05/02/2023  Check In:  Session Check In - 05/02/23 0916       Check-In   Supervising physician immediately available to respond to emergencies See telemetry face sheet for immediately available ER MD    Location [facility]-Cardiac & Pulmonary Rehab    Staff Present Kelton Pillar RN,BSN,MPA;Margaret Best, MS, Exercise Physiologist;Maxon Conetta BS, Exercise Physiologist;Noah Tickle, BS, Exercise Physiologist    Virtual Visit No    Medication changes reported     No    Fall or balance concerns reported    No    Warm-up and Cool-down Performed on first and last piece of equipment    Resistance Training Performed Yes    VAD Patient? No    PAD/SET Patient? No      Pain Assessment   Currently in Pain? No/denies                Social History   Tobacco Use  Smoking Status Former   Current packs/day: 0.00   Average packs/day: 2.0 packs/day for 40.0 years (80.0 ttl pk-yrs)   Types: Cigarettes   Start date: 10/09/1961   Quit date: 10/09/2001   Years since quitting: 21.5  Smokeless Tobacco Never    Goals Met:  Independence with exercise equipment Exercise tolerated well No report of concerns or symptoms today Strength training completed today  Goals Unmet:  Not Applicable  Comments: Pt able to follow exercise prescription today without complaint.  Will continue to monitor for progression.    Dr. Bethann Punches is Medical Director for [facility] Cardiac Rehabilitation.  Dr. Vida Rigger is Medical Director for [facility] Pulmonary Rehabilitation.

## 2023-05-04 ENCOUNTER — Encounter: Payer: Medicare Other | Admitting: *Deleted

## 2023-05-04 DIAGNOSIS — R0609 Other forms of dyspnea: Secondary | ICD-10-CM

## 2023-05-04 NOTE — Progress Notes (Signed)
Daily Session Note  Patient Details  Name: Walter Knox MRN: [id number] Date of Birth: December 18, 1940 Referring Provider:   Flowsheet Row Pulmonary Rehab from 03/27/2023 in [facility] Cardiac and Pulmonary Rehab  Referring Provider Vida Rigger, MD       Encounter Date: 05/04/2023  Check In:  Session Check In - 05/04/23 0929       Check-In   Supervising physician immediately available to respond to emergencies See telemetry face sheet for immediately available ER MD    Location [facility]-Cardiac & Pulmonary Rehab    Staff Present Rory Percy, MS, Exercise Physiologist;Kafi Dotter, RN, BSN, CCRP    Virtual Visit No    Medication changes reported     No    Fall or balance concerns reported    No    Resistance Training Performed Yes    VAD Patient? No    PAD/SET Patient? No      Pain Assessment   Currently in Pain? No/denies                Social History   Tobacco Use  Smoking Status Former   Current packs/day: 0.00   Average packs/day: 2.0 packs/day for 40.0 years (80.0 ttl pk-yrs)   Types: Cigarettes   Start date: 10/09/1961   Quit date: 10/09/2001   Years since quitting: 21.5  Smokeless Tobacco Never    Goals Met:  Proper associated with RPD/PD & O2 Sat Independence with exercise equipment Exercise tolerated well No report of concerns or symptoms today  Goals Unmet:  Not Applicable  Comments: Pt able to follow exercise prescription today without complaint.  Will continue to monitor for progression.    Dr. Bethann Punches is Medical Director for [facility] Cardiac Rehabilitation.  Dr. Vida Rigger is Medical Director for [facility] Pulmonary Rehabilitation.

## 2023-05-09 ENCOUNTER — Encounter: Payer: Medicare Other | Admitting: *Deleted

## 2023-05-09 DIAGNOSIS — R0609 Other forms of dyspnea: Secondary | ICD-10-CM

## 2023-05-09 NOTE — Progress Notes (Signed)
Daily Session Note  Patient Details  Name: Walter Knox MRN: [id number] Date of Birth: [birth year] Referring Provider:   Flowsheet Row Pulmonary Rehab from 03/27/2023 in [facility] Cardiac and Pulmonary Rehab  Referring Provider Vida Rigger, MD       Encounter Date: 05/09/2023  Check In:  Session Check In - 05/09/23 0936       Check-In   Supervising physician immediately available to respond to emergencies See telemetry face sheet for immediately available ER MD    Location [facility]-Cardiac & Pulmonary Rehab    Staff Present Rory Percy, MS, Exercise Physiologist;Jadarrius Maselli, RN, BSN, CCRP;Maxon Conetta BS, Exercise Physiologist;Noah Tickle, BS, Exercise Physiologist    Virtual Visit No    Medication changes reported     No    Fall or balance concerns reported    No    Warm-up and Cool-down Performed on first and last piece of equipment    Resistance Training Performed Yes    VAD Patient? No    PAD/SET Patient? No      Pain Assessment   Currently in Pain? No/denies                Social History   Tobacco Use  Smoking Status Former   Current packs/day: 0.00   Average packs/day: 2.0 packs/day for 40.0 years (80.0 ttl pk-yrs)   Types: Cigarettes   Start date: 10/09/1961   Quit date: 10/09/2001   Years since quitting: 21.5  Smokeless Tobacco Never    Goals Met:  Proper associated with RPD/PD & O2 Sat Independence with exercise equipment Exercise tolerated well No report of concerns or symptoms today  Goals Unmet:  Not Applicable  Comments: Pt able to follow exercise prescription today without complaint.  Will continue to monitor for progression.    Dr. Bethann Punches is Medical Director for [facility] Cardiac Rehabilitation.  Dr. Vida Rigger is Medical Director for [facility] Pulmonary Rehabilitation.

## 2023-05-11 ENCOUNTER — Encounter: Payer: Medicare Other | Admitting: *Deleted

## 2023-05-11 DIAGNOSIS — R0609 Other forms of dyspnea: Secondary | ICD-10-CM | POA: Diagnosis not present

## 2023-05-11 NOTE — Progress Notes (Signed)
Daily Session Note  Patient Details  Name: Walter Knox MRN: [id number] Date of Birth: 1940/04/14 Referring Provider:   Flowsheet Row Pulmonary Rehab from 03/27/2023 in [facility] Cardiac and Pulmonary Rehab  Referring Provider Vida Rigger, MD       Encounter Date: 05/11/2023  Check In:  Session Check In - 05/11/23 4098       Check-In   Supervising physician immediately available to respond to emergencies See telemetry face sheet for immediately available ER MD    Location [facility]-Cardiac & Pulmonary Rehab    Staff Present Cora Collum, RN, BSN, CCRP;Maxon Conetta BS, Exercise Physiologist;Joseph Hood RCP,RRT,BSRT;Noah Tickle, [place], Exercise Physiologist    Virtual Visit No    Medication changes reported     No    Fall or balance concerns reported    No    Warm-up and Cool-down Performed on first and last piece of equipment    Resistance Training Performed Yes    VAD Patient? No    PAD/SET Patient? No      Pain Assessment   Currently in Pain? No/denies                Social History   Tobacco Use  Smoking Status Former   Current packs/day: 0.00   Average packs/day: 2.0 packs/day for 40.0 years (80.0 ttl pk-yrs)   Types: Cigarettes   Start date: 10/09/1961   Quit date: 10/09/2001   Years since quitting: 21.6  Smokeless Tobacco Never    Goals Met:  Proper associated with RPD/PD & O2 Sat Independence with exercise equipment Exercise tolerated well No report of concerns or symptoms today  Goals Unmet:  Not Applicable  Comments: Pt able to follow exercise prescription today without complaint.  Will continue to monitor for progression.    Dr. Bethann Punches is Medical Director for [facility] Cardiac Rehabilitation.  Dr. Vida Rigger is Medical Director for [facility] Pulmonary Rehabilitation.

## 2023-05-16 ENCOUNTER — Encounter: Payer: Medicare Other | Attending: Pulmonary Disease | Admitting: *Deleted

## 2023-05-16 DIAGNOSIS — R0609 Other forms of dyspnea: Secondary | ICD-10-CM | POA: Insufficient documentation

## 2023-05-16 NOTE — Progress Notes (Signed)
Daily Session Note  Patient Details  Name: Walter Knox MRN: [id number] Date of Birth: [birth year] Referring Provider:   Flowsheet Row Pulmonary Rehab from 03/27/2023 in [facility] Cardiac and Pulmonary Rehab  Referring Provider Vida Rigger, MD       Encounter Date: 05/16/2023  Check In:  Session Check In - 05/16/23 0958       Check-In   Supervising physician immediately available to respond to emergencies See telemetry face sheet for immediately available ER MD    Location [facility]-Cardiac & Pulmonary Rehab    Staff Present Rory Percy, MS, Exercise Physiologist;Dylin Ihnen, RN, BSN, CCRP;Noah Tickle, BS, Exercise Physiologist;Maxon Conetta BS, Exercise Physiologist    Virtual Visit No    Medication changes reported     No    Fall or balance concerns reported    No    Warm-up and Cool-down Performed on first and last piece of equipment    Resistance Training Performed Yes    VAD Patient? No    PAD/SET Patient? No      Pain Assessment   Currently in Pain? No/denies                Social History   Tobacco Use  Smoking Status Former   Current packs/day: 0.00   Average packs/day: 2.0 packs/day for 40.0 years (80.0 ttl pk-yrs)   Types: Cigarettes   Start date: 10/09/1961   Quit date: 10/09/2001   Years since quitting: 21.6  Smokeless Tobacco Never    Goals Met:  Proper associated with RPD/PD & O2 Sat Independence with exercise equipment Exercise tolerated well No report of concerns or symptoms today  Goals Unmet:  Not Applicable  Comments: Pt able to follow exercise prescription today without complaint.  Will continue to monitor for progression.    Dr. Bethann Punches is Medical Director for [facility] Cardiac Rehabilitation.  Dr. Vida Rigger is Medical Director for [facility] Pulmonary Rehabilitation.

## 2023-05-18 ENCOUNTER — Encounter: Payer: Medicare Other | Admitting: *Deleted

## 2023-05-18 DIAGNOSIS — R0609 Other forms of dyspnea: Secondary | ICD-10-CM

## 2023-05-18 NOTE — Progress Notes (Signed)
 Daily Session Note  Patient Details  Name: Walter Knox MRN: [id number] Date of Birth: 1940/04/21 Referring Provider:   Flowsheet Row Pulmonary Rehab from 03/27/2023 in [facility] Cardiac and Pulmonary Rehab  Referring Provider Parris Manna, MD       Encounter Date: 05/18/2023  Check In:  Session Check In - 05/18/23 0946       Check-In   Supervising physician immediately available to respond to emergencies See telemetry face sheet for immediately available ER MD    Location [facility]-Cardiac & Pulmonary Rehab    Staff Present Maxon Conetta BS, Exercise Physiologist;Joseph Hood RCP,RRT,BSRT;Noah Tickle, BS, Exercise Physiologist;Oreatha Fabry, RN, BSN, CCRP    Virtual Visit No    Medication changes reported     No    Fall or balance concerns reported    No    Warm-up and Cool-down Performed on first and last piece of equipment    Resistance Training Performed Yes    VAD Patient? No    PAD/SET Patient? No      Pain Assessment   Currently in Pain? No/denies                Social History   Tobacco Use  Smoking Status Former   Current packs/day: 0.00   Average packs/day: 2.0 packs/day for 40.0 years (80.0 ttl pk-yrs)   Types: Cigarettes   Start date: 10/09/1961   Quit date: 10/09/2001   Years since quitting: 21.6  Smokeless Tobacco Never    Goals Met:  Proper associated with RPD/PD & O2 Sat Independence with exercise equipment Exercise tolerated well No report of concerns or symptoms today  Goals Unmet:  Not Applicable  Comments: Pt able to follow exercise prescription today without complaint.  Will continue to monitor for progression.    Dr. Oneil Pinal is Medical Director for [facility] Cardiac Rehabilitation.  Dr. Fuad Aleskerov is Medical Director for [facility] Pulmonary Rehabilitation.

## 2023-05-23 ENCOUNTER — Encounter: Payer: Medicare Other | Admitting: *Deleted

## 2023-05-23 DIAGNOSIS — R0609 Other forms of dyspnea: Secondary | ICD-10-CM | POA: Diagnosis not present

## 2023-05-23 NOTE — Progress Notes (Signed)
Daily Session Note  Patient Details  Name: Walter Knox MRN: [id number] Date of Birth: 1941-04-10 Referring Provider:   Flowsheet Row Pulmonary Rehab from 03/27/2023 in [facility] Cardiac and Pulmonary Rehab  Referring Provider Vida Rigger, MD       Encounter Date: 05/23/2023  Check In:  Session Check In - 05/23/23 0928       Check-In   Supervising physician immediately available to respond to emergencies See telemetry face sheet for immediately available ER MD    Location [facility]-Cardiac & Pulmonary Rehab    Staff Present Rory Percy, MS, Exercise Physiologist;Ranay Ketter, RN, BSN, CCRP;Maxon Conetta BS, Exercise Physiologist;Noah Tickle, BS, Exercise Physiologist    Virtual Visit No    Medication changes reported     No    Fall or balance concerns reported    No    Warm-up and Cool-down Performed on first and last piece of equipment    Resistance Training Performed Yes    VAD Patient? No    PAD/SET Patient? No      Pain Assessment   Currently in Pain? No/denies                Social History   Tobacco Use  Smoking Status Former   Current packs/day: 0.00   Average packs/day: 2.0 packs/day for 40.0 years (80.0 ttl pk-yrs)   Types: Cigarettes   Start date: 10/09/1961   Quit date: 10/09/2001   Years since quitting: 21.6  Smokeless Tobacco Never    Goals Met:  Proper associated with RPD/PD & O2 Sat Independence with exercise equipment Exercise tolerated well No report of concerns or symptoms today  Goals Unmet:  Not Applicable  Comments: Pt able to follow exercise prescription today without complaint.  Will continue to monitor for progression.    Dr. Bethann Punches is Medical Director for [facility] Cardiac Rehabilitation.  Dr. Vida Rigger is Medical Director for [facility] Pulmonary Rehabilitation.

## 2023-05-24 ENCOUNTER — Encounter: Payer: Self-pay | Admitting: *Deleted

## 2023-05-24 DIAGNOSIS — R0609 Other forms of dyspnea: Secondary | ICD-10-CM

## 2023-05-24 NOTE — Progress Notes (Signed)
Pulmonary Individual Treatment Plan  Patient Details  Name: Walter Knox MRN: [id number] Date of Birth: 02/03/1941 Referring Provider:   Flowsheet Row Pulmonary Rehab from 03/27/2023 in [facility] Card[facility]monary Rehab  Referring Provider Vida Rigger, MD       Initial Encounter Date:  Flowsheet Row Pulmonary Rehab from 03/27/2023 in [facility] Cardiac and Pulmonary Rehab  Date 03/27/23       Visit Diagnosis: Dyspnea on exertion  Patient's Home Medications on Admission:  Current Outpatient Medications:    acetaminophen (TYLENOL) 500 MG tablet, Take 1,000 mg by mouth 2 (two) times daily., Disp: , Rfl:    amiodarone (PACERONE) 200 MG tablet, Take 200 mg by mouth daily., Disp: , Rfl:    docusate sodium (COLACE) 100 MG capsule, Take 100 mg by mouth 2 (two) times daily., Disp: , Rfl:    ELIQUIS 5 MG TABS tablet, Take 5 mg by mouth 2 (two) times daily., Disp: , Rfl:    ezetimibe (ZETIA) 10 MG tablet, Take 10 mg by mouth at bedtime., Disp: , Rfl:    lovastatin (ALTOPREV) 20 MG 24 hr tablet, Take 20 mg by mouth at bedtime., Disp: , Rfl:    metFORMIN (GLUCOPHAGE-XR) 500 MG 24 hr tablet, Take 500 mg by mouth daily with supper., Disp: , Rfl:    metoprolol tartrate (LOPRESSOR) 100 MG tablet, Take 100 mg by mouth 2 (two) times daily., Disp: , Rfl:    Multiple Vitamin (MULTIVITAMIN) tablet, Take 1 tablet by mouth daily., Disp: , Rfl:    omeprazole (PRILOSEC) 20 MG capsule, Take 20 mg by mouth at bedtime. , Disp: , Rfl:    ondansetron (ZOFRAN-ODT) 8 MG disintegrating tablet, Take 1 tablet (8 mg total) by mouth every 8 (eight) hours as needed., Disp: 20 tablet, Rfl: 0   polycarbophil (FIBERCON) 625 MG tablet, Take 1,875 mg by mouth 2 (two) times daily., Disp: , Rfl:    torsemide (DEMADEX) 20 MG tablet, 20 mg once., Disp: , Rfl:   Past Medical History: Past Medical History:  Diagnosis Date   Anginal pain (HCC)    BPH (benign prostatic hyperplasia)    CAD (coronary artery disease)    Cancer  (HCC)    Chronic back pain    Dyspnea    Dysrhythmia    Environmental and seasonal allergies    Hyperlipidemia    Hypertension    Lower extremity edema    Morbid obesity (HCC)    Pre-diabetes    PVD (peripheral vascular disease) (HCC)    SSS (sick sinus syndrome) (HCC)     Tobacco Use: Social History   Tobacco Use  Smoking Status Former   Current packs/day: 0.00   Average packs/day: 2.0 packs/day for 40.0 years (80.0 ttl pk-yrs)   Types: Cigarettes   Start date: 10/09/1961   Quit date: 10/09/2001   Years since quitting: 21.6  Smokeless Tobacco Never    Labs: Review Flowsheet        No data to display           Pulmonary Assessment Scores:  Pulmonary Assessment Scores     Row Name 03/27/23 1647 04/04/23 0927       ADL UCSD   ADL Phase -- Entry    SOB Score total -- 32    Rest -- 0    Walk -- 0  Starts at 0 and becomes 4    Stairs -- 4    Bath -- 0    Dress -- 0  Shop -- 1      CAT Score   CAT Score 10 --      mMRC Score   mMRC Score 3 --             UCSD: Self-administered rating of dyspnea associated with activities of daily living (ADLs) 6-point scale (0 = "not at all" to 5 = "maximal or unable to do because of breathlessness")  Scoring Scores range from 0 to 120.  Minimally important difference is 5 units  CAT: CAT can identify the health impairment of COPD patients and is better correlated with disease progression.  CAT has a scoring range of zero to 40. The CAT score is classified into four groups of low (less than 10), medium (10 - 20), high (21-30) and very high (31-40) based on the impact level of disease on health status. A CAT score over 10 suggests significant symptoms.  A worsening CAT score could be explained by an exacerbation, poor medication adherence, poor inhaler technique, or progression of COPD or comorbid conditions.  CAT MCID is 2 points  mMRC: mMRC (Modified Medical Research Council) Dyspnea Scale is used to assess the  degree of baseline functional disability in patients of respiratory disease due to dyspnea. No minimal important difference is established. A decrease in score of 1 point or greater is considered a positive change.   Pulmonary Function Assessment:   Exercise Target Goals: Exercise Program Goal: Individual exercise prescription set using results from initial 6 min walk test and THRR while considering  patient's activity barriers and safety.   Exercise Prescription Goal: Initial exercise prescription builds to 30-45 minutes a day of aerobic activity, 2-3 days per week.  Home exercise guidelines will be given to patient during program as part of exercise prescription that the participant will acknowledge.  Education: Aerobic Exercise: - Group verbal and visual presentation on the components of exercise prescription. Introduces F.I.T.T principle from ACSM for exercise prescriptions.  Reviews F.I.T.T. principles of aerobic exercise including progression. Written material given at graduation.   Education: Resistance Exercise: - Group verbal and visual presentation on the components of exercise prescription. Introduces F.I.T.T principle from ACSM for exercise prescriptions  Reviews F.I.T.T. principles of resistance exercise including progression. Written material given at graduation. Flowsheet Row Pulmonary Rehab from 05/11/2023 in [facility] Cardiac and Pulmonary Rehab  Date 04/13/23  Educator MB  Instruction Review Code 1- [employer] Understanding        Education: Exercise & Equipment Safety: - Individual verbal instruction and demonstration of equipment use and safety with use of the equipment. Flowsheet Row Pulmonary Rehab from 05/11/2023 in [facility] Cardiac and Pulmonary Rehab  Date 03/27/23  Educator MB  Instruction Review Code 1- Verbalizes Understanding       Education: Exercise Physiology & General Exercise Guidelines: - Group verbal and written instruction with models to review the  exercise physiology of the cardiovascular system and associated critical values. Provides general exercise guidelines with specific guidelines to those with heart or lung disease.    Education: Flexibility, Balance, Mind/Body Relaxation: - Group verbal and visual presentation with interactive activity on the components of exercise prescription. Introduces F.I.T.T principle from ACSM for exercise prescriptions. Reviews F.I.T.T. principles of flexibility and balance exercise training including progression. Also discusses the mind body connection.  Reviews various relaxation techniques to help reduce and manage stress (i.e. Deep breathing, progressive muscle relaxation, and visualization). Balance handout provided to take home. Written material given at graduation. Flowsheet Row Pulmonary Rehab from 05/11/2023 in [facility]  Cardiac and Pulmonary Rehab  Date 04/13/23  Educator MB  Instruction Review Code 1- Verbalizes Understanding       Activity Barriers & Risk Stratification:  Activity Barriers & Cardiac Risk Stratification - 03/27/23 1641       Activity Barriers & Cardiac Risk Stratification   Activity Barriers Left Knee Replacement;Right Knee Replacement;Deconditioning             6 Minute Walk:  6 Minute Walk     Row Name 03/27/23 1640         6 Minute Walk   Phase Initial     Distance 1185 feet     Walk Time 6 minutes     # of Rest Breaks 0     MPH 2.24     METS 1.45     RPE 5.07     Perceived Dyspnea  3     VO2 Peak 5.07     Symptoms Yes (comment)     Comments SOB     Resting HR 60 bpm     Resting BP 122/80     Resting Oxygen Saturation  93 %     Exercise Oxygen Saturation  during 6 min walk 94 %     Max Ex. HR 101 bpm     Max Ex. BP 154/76     2 Minute Post BP 126/76       Interval HR   1 Minute HR 96     2 Minute HR 101     3 Minute HR 80     4 Minute HR 84     5 Minute HR 84     6 Minute HR 90     2 Minute Post HR 60     Interval Heart Rate? Yes        Interval Oxygen   Interval Oxygen? Yes     Baseline Oxygen Saturation % 93 %     1 Minute Oxygen Saturation % 94 %     1 Minute Liters of Oxygen 0 L     2 Minute Oxygen Saturation % 95 %     2 Minute Liters of Oxygen 0 L     3 Minute Oxygen Saturation % 95 %     3 Minute Liters of Oxygen 0 L     4 Minute Oxygen Saturation % 96 %     4 Minute Liters of Oxygen 0 L     5 Minute Oxygen Saturation % 96 %     5 Minute Liters of Oxygen 0 L     6 Minute Oxygen Saturation % 96 %     6 Minute Liters of Oxygen 0 L     2 Minute Post Oxygen Saturation % 97 %     2 Minute Post Liters of Oxygen 0 L             Oxygen Initial Assessment:  Oxygen Initial Assessment - 03/16/23 1431       Home Oxygen   Home Oxygen Device None    Sleep Oxygen Prescription None    Home Exercise Oxygen Prescription None    Home Resting Oxygen Prescription None    Compliance with Home Oxygen Use Yes      Intervention   Short Term Goals To learn and understand importance of monitoring SPO2 with pulse oximeter and demonstrate accurate use of the pulse oximeter.;To learn and understand importance of maintaining oxygen saturations>88%;To learn and demonstrate proper pursed lip breathing  techniques or other breathing techniques. ;To learn and demonstrate proper use of respiratory medications    Long  Term Goals Verbalizes importance of monitoring SPO2 with pulse oximeter and return demonstration;Maintenance of O2 saturations>88%;Exhibits proper breathing techniques, such as pursed lip breathing or other method taught during program session;Compliance with respiratory medication;Demonstrates proper use of MDI's             Oxygen Re-Evaluation:  Oxygen Re-Evaluation     Row Name 04/04/23 0934 05/09/23 0933           Program Oxygen Prescription   Program Oxygen Prescription None None        Home Oxygen   Home Oxygen Device None None      Sleep Oxygen Prescription None None      Home Exercise Oxygen  Prescription None None      Home Resting Oxygen Prescription None None      Compliance with Home Oxygen Use Yes Yes        Goals/Expected Outcomes   Short Term Goals To learn and demonstrate proper pursed lip breathing techniques or other breathing techniques.  To learn and demonstrate proper pursed lip breathing techniques or other breathing techniques.       Long  Term Goals Exhibits proper breathing techniques, such as pursed lip breathing or other method taught during program session Exhibits proper breathing techniques, such as pursed lip breathing or other method taught during program session      Comments Reviewed PLB technique with pt.  Talked about how it works and it's importance in maintaining their exercise saturations. Reviewed PLB technique with pt.  Talked about how it works and it's importance in maintaining their exercise saturations.      Goals/Expected Outcomes Short: Become more profiecient at using PLB. Long: Become independent at using PLB. Short: Become more profiecient at using PLB. Long: Become independent at using PLB.               Oxygen Discharge (Final Oxygen Re-Evaluation):  Oxygen Re-Evaluation - 05/09/23 0933       Program Oxygen Prescription   Program Oxygen Prescription None      Home Oxygen   Home Oxygen Device None    Sleep Oxygen Prescription None    Home Exercise Oxygen Prescription None    Home Resting Oxygen Prescription None    Compliance with Home Oxygen Use Yes      Goals/Expected Outcomes   Short Term Goals To learn and demonstrate proper pursed lip breathing techniques or other breathing techniques.     Long  Term Goals Exhibits proper breathing techniques, such as pursed lip breathing or other method taught during program session    Comments Reviewed PLB technique with pt.  Talked about how it works and it's importance in maintaining their exercise saturations.    Goals/Expected Outcomes Short: Become more profiecient at using PLB.  Long: Become independent at using PLB.             Initial Exercise Prescription:  Initial Exercise Prescription - 03/27/23 1600       Date of Initial Exercise RX and Referring Provider   Date 03/27/23    Referring Provider Vida Rigger, MD      Oxygen   Maintain Oxygen Saturation 88% or higher      Treadmill   MPH 2    Grade 0    Minutes 15    METs 2.53      Recumbant Bike   Level 1  RPM 50    Watts 15    Minutes 15    METs 1.45      NuStep   Level 1    SPM 80    Minutes 15    METs 1.45      Track   Laps 28    Minutes 15    METs 2.52      Prescription Details   Frequency (times per week) 2    Duration Progress to 30 minutes of continuous aerobic without signs/symptoms of physical distress      Intensity   THRR 40-80% of Max Heartrate 90-121    Ratings of Perceived Exertion 11-13    Perceived Dyspnea 0-4      Progression   Progression Continue to progress workloads to maintain intensity without signs/symptoms of physical distress.      Resistance Training   Training Prescription Yes    Weight 7 lb    Reps 10-15             Perform Capillary Blood Glucose checks as needed.  Exercise Prescription Changes:   Exercise Prescription Changes     Row Name 03/27/23 1600 04/19/23 1300 05/02/23 1000 05/03/23 1400 05/16/23 0700     Response to Exercise   Blood Pressure (Admit) 122/80 120/80 120/80 104/60 126/74   Blood Pressure (Exercise) 154/76 142/68 142/68 128/70 126/88   Blood Pressure (Exit) 126/76 106/62 106/62 102/64 98/58   Heart Rate (Admit) 60 bpm 60 bpm 60 bpm 79 bpm 83 bpm   Heart Rate (Exercise) 101 bpm 95 bpm 95 bpm 107 bpm 108 bpm   Heart Rate (Exit) 60 bpm 81 bpm 81 bpm 85 bpm 78 bpm   Oxygen Saturation (Admit) 93 % 93 % 93 % 96 % 95 %   Oxygen Saturation (Exercise) 94 % 93 % 93 % 95 % 93 %   Oxygen Saturation (Exit) 97 % 96 % 96 % 97 % 93 %   Rating of Perceived Exertion (Exercise) 15 15 15 14 14    Perceived Dyspnea  (Exercise) 3 2 2 3  --   Symptoms SOB -- -- none none   Comments results -- -- -- --   Duration Progress to 30 minutes of  aerobic without signs/symptoms of physical distress Progress to 30 minutes of  aerobic without signs/symptoms of physical distress Progress to 30 minutes of  aerobic without signs/symptoms of physical distress Progress to 30 minutes of  aerobic without signs/symptoms of physical distress Progress to 30 minutes of  aerobic without signs/symptoms of physical distress   Intensity THRR New THRR unchanged THRR unchanged THRR unchanged THRR unchanged     Progression   Progression Continue to progress workloads to maintain intensity without signs/symptoms of physical distress. Continue to progress workloads to maintain intensity without signs/symptoms of physical distress. Continue to progress workloads to maintain intensity without signs/symptoms of physical distress. Continue to progress workloads to maintain intensity without signs/symptoms of physical distress. Continue to progress workloads to maintain intensity without signs/symptoms of physical distress.   Average METs 1.45 3.5 3.5 3.66 3.98     Resistance Training   Training Prescription -- Yes Yes Yes Yes   Weight -- 7 lb 7 lb 7 lb 7 lb   Reps -- 10-15 10-15 10-15 10-15     Interval Training   Interval Training -- No No No No     Treadmill   MPH -- 2 2 2.2 2.8   Grade -- 0 0 0 0  Minutes -- 15 15 15 15    METs -- 2.53 2.53 2.69 3.14     NuStep   Level -- 4 4 5 5    Minutes -- 15 15 15 15    METs -- 5.6 5.6 5.2 5.8     Home Exercise Plan   Plans to continue exercise at -- -- [employer] (comment)  Looking at a few different gyms ([place], [employer], or [place]) to do aerobic and resistance exercise. Will also golf some. [employer] (comment)  Looking at a few different gyms ([place], [employer], or [place]) to do aerobic and resistance exercise. Will also golf some. [employer] (comment)  Looking at a few  different gyms ([place], [employer], or [place]) to do aerobic and resistance exercise. Will also golf some.   Frequency -- -- Add 3 additional days to program exercise sessions. Add 3 additional days to program exercise sessions. Add 3 additional days to program exercise sessions.   Initial Home Exercises Provided -- -- 05/02/23 05/02/23 05/02/23     Oxygen   Maintain Oxygen Saturation -- 88% or higher 88% or higher 88% or higher 88% or higher            Exercise Comments:   Exercise Comments     Row Name 04/04/23 1610           Exercise Comments First full day of exercise!  Patient was oriented to gym and equipment including functions, settings, policies, and procedures.  Patient's individual exercise prescription and treatment plan were reviewed.  All starting workloads were established based on the results of the 6 minute walk test done at initial orientation visit.  The plan for exercise progression was also introduced and progression will be customized based on patient's performance and goals.                Exercise Goals and Review:   Exercise Goals     Row Name 03/27/23 1646             Exercise Goals   Increase Physical Activity Yes       Intervention Provide advice, education, support and counseling about physical activity/exercise needs.;Develop an individualized exercise prescription for aerobic and resistive training based on initial evaluation findings, risk stratification, comorbidities and participant's personal goals.       Expected Outcomes Short Term: Attend rehab on a regular basis to increase amount of physical activity.;Long Term: Add in home exercise to make exercise part of routine and to increase amount of physical activity.;Long Term: Exercising regularly at least 3-5 days a week.       Increase Strength and Stamina Yes       Intervention Provide advice, education, support and counseling about physical activity/exercise needs.;Develop an individualized  exercise prescription for aerobic and resistive training based on initial evaluation findings, risk stratification, comorbidities and participant's personal goals.       Expected Outcomes Short Term: Increase workloads from initial exercise prescription for resistance, speed, and METs.;Short Term: Perform resistance training exercises routinely during rehab and add in resistance training at home;Long Term: Improve cardiorespiratory fitness, muscular endurance and strength as measured by increased METs and functional capacity ( )       Able to understand and use rate of perceived exertion (RPE) scale Yes       Intervention Provide education and explanation on how to use RPE scale       Expected Outcomes Short Term: Able to use RPE daily in rehab to express subjective intensity level;Long  Term:  Able to use RPE to guide intensity level when exercising independently       Able to understand and use Dyspnea scale Yes       Intervention Provide education and explanation on how to use Dyspnea scale       Expected Outcomes Short Term: Able to use Dyspnea scale daily in rehab to express subjective sense of shortness of breath during exertion;Long Term: Able to use Dyspnea scale to guide intensity level when exercising independently       Knowledge and understanding of Target Heart Rate Range (THRR) Yes       Intervention Provide education and explanation of THRR including how the numbers were predicted and where they are located for reference       Expected Outcomes Short Term: Able to state/look up THRR;Short Term: Able to use daily as guideline for intensity in rehab;Long Term: Able to use THRR to govern intensity when exercising independently       Able to check pulse independently Yes       Intervention Provide education and demonstration on how to check pulse in carotid and radial arteries.;Review the importance of being able to check your own pulse for safety during independent exercise       Expected  Outcomes Short Term: Able to explain why pulse checking is important during independent exercise;Long Term: Able to check pulse independently and accurately       Understanding of Exercise Prescription Yes       Intervention Provide education, explanation, and written materials on patient's individual exercise prescription       Expected Outcomes Short Term: Able to explain program exercise prescription;Long Term: Able to explain home exercise prescription to exercise independently                Exercise Goals Re-Evaluation :  Exercise Goals Re-Evaluation     Row Name 04/04/23 0935 04/19/23 1357 05/02/23 1033 05/03/23 1427 05/16/23 0757     Exercise Goal Re-Evaluation   Exercise Goals Review Able to understand and use rate of perceived exertion (RPE) scale;Knowledge and understanding of Target Heart Rate Range (THRR);Understanding of Exercise Prescription;Able to understand and use Dyspnea scale Increase Physical Activity;Increase Strength and Stamina;Understanding of Exercise Prescription Able to check pulse independently;Able to understand and use Dyspnea scale;Increase Strength and Stamina;Understanding of Exercise Prescription;Knowledge and understanding of Target Heart Rate Range (THRR);Able to understand and use rate of perceived exertion (RPE) scale;Increase Physical Activity Increase Physical Activity;Increase Strength and Stamina;Understanding of Exercise Prescription Increase Physical Activity;Increase Strength and Stamina;Understanding of Exercise Prescription   Comments Reviewed RPE and dyspnea scale, THR and program prescription with pt today.  Pt voiced understanding and was given a copy of goals to take home. Walter Knox is off to a great start in the program. He has attended his first 4 sessions of the program, and has been able to increase to level 4 on the T4 nustep. He is also off to a great start on the treadmill at a workload of and no incline. We will continue to monitor his  progress in the program. Reviewed home exercise with pt today from 9:37am to 10:00am.  Pt plans to go to a gym facility ([place], [employer], or [place]) for aerobic and resistance exercise 3 additional days for exercise. He also will golf 2 days a week when the weather is appropriate. Reviewed THR, pulse, RPE, sign and symptoms, pulse oximetery and when to call 911 or MD.  Also discussed weather considerations and  indoor options.  Pt voiced understanding. Walter Knox continues to do well in rehab. He was recently able to increase his speed on the treadmill from to 2.58mph. He was also able to increase his level on the T4 nustep from level 4 to 5. We will continue to monitor his progress in the program. Walter Knox is doing well in the program. He has recently been able to increase his speed on the treadmill from 2.2 to 2.79mph. He was also able to maintain an intensity of level 5 on the T4 nustep. We will continue to monitor his progress in the program.   Expected Outcomes Short: Use RPE daily to regulate intensity. Long: Follow program prescription in THR. Short: Continue to dollow exercise prescription and increase workloads when able. Long: Continue exercise to improve strength and stamina. Short: Add 3 additional days at home for exercise. Long: Continue to exercise independently for 5 days a week to improve strength and stamina. Short: Continue to increase treadmill workloads. Long: Continue exercise to increase strength and stamina. Short: Continue to increase treadmill workloads. Long: Continue exercise to increase strength and stamina.            Discharge Exercise Prescription (Final Exercise Prescription Changes):  Exercise Prescription Changes - 05/16/23 0700       Response to Exercise   Blood Pressure (Admit) 126/74    Blood Pressure (Exercise) 126/88    Blood Pressure (Exit) 98/58    Heart Rate (Admit) 83 bpm    Heart Rate (Exercise) 108 bpm    Heart Rate (Exit) 78 bpm    Oxygen Saturation (Admit) 95 %     Oxygen Saturation (Exercise) 93 %    Oxygen Saturation (Exit) 93 %    Rating of Perceived Exertion (Exercise) 14    Symptoms none    Duration Progress to 30 minutes of  aerobic without signs/symptoms of physical distress    Intensity THRR unchanged      Progression   Progression Continue to progress workloads to maintain intensity without signs/symptoms of physical distress.    Average METs 3.98      Resistance Training   Training Prescription Yes    Weight 7 lb    Reps 10-15      Interval Training   Interval Training No      Treadmill   MPH 2.8    Grade 0    Minutes 15    METs 3.14      NuStep   Level 5    Minutes 15    METs 5.8      Home Exercise Plan   Plans to continue exercise at [employer] (comment)   Looking at a few different gyms ([place], [employer], or [place]) to do aerobic and resistance exercise. Will also golf some.   Frequency Add 3 additional days to program exercise sessions.    Initial Home Exercises Provided 05/02/23      Oxygen   Maintain Oxygen Saturation 88% or higher             Nutrition:  Target Goals: Understanding of nutrition guidelines, daily intake of sodium 1500mg , cholesterol 200mg , calories 30% from fat and 7% or less from saturated fats, daily to have 5 or more servings of fruits and vegetables.  Education: All About Nutrition: -Group instruction provided by verbal, written material, interactive activities, discussions, models, and posters to present general guidelines for heart healthy nutrition including fat, fiber, MyPlate, the role of sodium in heart healthy nutrition, utilization of the  nutrition label, and utilization of this knowledge for meal planning. Follow up email sent as well. Written material given at graduation.   Biometrics:  Pre Biometrics - 03/27/23 1647       Pre Biometrics   Height 5' 9.2" (1.758 m)    Weight 270 lb 11.2 oz (122.8 kg)    Waist Circumference 56.5 inches    Hip Circumference 55  inches    Waist to Hip Ratio 1.03 %    BMI (Calculated) 39.73    Single Leg Stand 4.5 seconds              Nutrition Therapy Plan and Nutrition Goals:  Nutrition Therapy & Goals - 03/27/23 1650       Nutrition Therapy   RD appointment deferred Yes      Personal Nutrition Goals   Nutrition Goal RD appointment deferred, has another nutrition plan in place      Intervention Plan   Intervention Prescribe, educate and counsel regarding individualized specific dietary modifications aiming towards targeted core components such as weight, hypertension, lipid management, diabetes, heart failure and other comorbidities.;Nutrition handout(s) given to patient.    Expected Outcomes Short Term Goal: Understand basic principles of dietary content, such as calories, fat, sodium, cholesterol and nutrients.;Short Term Goal: A plan has been developed with personal nutrition goals set during dietitian appointment.;Long Term Goal: Adherence to prescribed nutrition plan.             Nutrition Assessments:  MEDIFICTS Score Key: >=70 Need to make dietary changes  40-70 Heart Healthy Diet <= 40 Therapeutic Level Cholesterol Diet  Flowsheet Row Pulmonary Rehab from 03/27/2023 in [facility] Cardiac and Pulmonary Rehab  Picture Your Plate Total Score on Admission 57      Picture Your Plate Scores: <16 Unhealthy dietary pattern with much room for improvement. 41-50 Dietary pattern unlikely to meet recommendations for good health and room for improvement. 51-60 More healthful dietary pattern, with some room for improvement.  >60 Healthy dietary pattern, although there may be some specific behaviors that could be improved.   Nutrition Goals Re-Evaluation:  Nutrition Goals Re-Evaluation     Row Name 04/20/23 0927 05/09/23 0926           Goals   Current Weight 278 lb (126.1 kg) 272 lb 4.8 oz (123.5 kg)      Nutrition Goal -- RD appointment deferred, has another nutrition plan in place       Comment Patient was informed on why it is important to maintain a balanced diet when dealing with Respiratory issues. Explained that it takes a lot of energy to breath and when they are short of breath often they will need to have a good diet to help keep up with the calories they are expending for breathing. Patient was informed on why it is important to maintain a balanced diet when dealing with Respiratory issues. Walter Knox says that he is still maintaining his diet in order to have enough energy for exercise and manage his weight.      Expected Outcome Short: Choose and plan snacks accordingly to patients caloric intake to improve breathing. Long: Maintain a diet independently that meets their caloric intake to aid in daily shortness of breath. Short: Choose and plan snacks accordingly to patients caloric intake to improve breathing. Long: Maintain a diet independently that meets their caloric intake to aid in daily shortness of breath.               Nutrition Goals  Discharge (Final Nutrition Goals Re-Evaluation):  Nutrition Goals Re-Evaluation - 05/09/23 0926       Goals   Current Weight 272 lb 4.8 oz (123.5 kg)    Nutrition Goal RD appointment deferred, has another nutrition plan in place    Comment Patient was informed on why it is important to maintain a balanced diet when dealing with Respiratory issues. Walter Knox says that he is still maintaining his diet in order to have enough energy for exercise and manage his weight.    Expected Outcome Short: Choose and plan snacks accordingly to patients caloric intake to improve breathing. Long: Maintain a diet independently that meets their caloric intake to aid in daily shortness of breath.             Psychosocial: Target Goals: Acknowledge presence or absence of significant depression and/or stress, maximize coping skills, provide positive support system. Participant is able to verbalize types and ability to use techniques and skills needed for  reducing stress and depression.   Education: Stress, Anxiety, and Depression - Group verbal and visual presentation to define topics covered.  Reviews how body is impacted by stress, anxiety, and depression.  Also discusses healthy ways to reduce stress and to treat/manage anxiety and depression.  Written material given at graduation.   Education: Sleep Hygiene -Provides group verbal and written instruction about how sleep can affect your health.  Define sleep hygiene, discuss sleep cycles and impact of sleep habits. Review good sleep hygiene tips.    Initial Review & Psychosocial Screening:  Initial Psych Review & Screening - 03/16/23 1443       Initial Review   Current issues with None Identified      Family Dynamics   Good Support System? Yes   wife     Barriers   Psychosocial barriers to participate in program There are no identifiable barriers or psychosocial needs.;The patient should benefit from training in stress management and relaxation.      Screening Interventions   Interventions Encouraged to exercise;Provide feedback about the scores to participant;To provide support and resources with identified psychosocial needs    Expected Outcomes Short Term goal: Utilizing psychosocial counselor, staff and physician to assist with identification of specific Stressors or current issues interfering with healing process. Setting desired goal for each stressor or current issue identified.;Long Term Goal: Stressors or current issues are controlled or eliminated.;Short Term goal: Identification and review with participant of any Quality of Life or Depression concerns found by scoring the questionnaire.;Long Term goal: The participant improves quality of Life and PHQ9 Scores as seen by post scores and/or verbalization of changes             Quality of Life Scores:  Scores of 19 and below usually indicate a poorer quality of life in these areas.  A difference of  2-3 points is a  clinically meaningful difference.  A difference of 2-3 points in the total score of the Quality of Life Index has been associated with significant improvement in overall quality of life, self-image, physical symptoms, and general health in studies assessing change in quality of life.  PHQ-9: Review Flowsheet       03/27/2023  Depression screen PHQ 2/9  Decreased Interest 0  Down, Depressed, Hopeless 0  PHQ - 2 Score 0  Altered sleeping 1  Tired, decreased energy 1  Change in appetite 0  Feeling bad or failure about yourself  0  Trouble concentrating 0  Moving slowly or fidgety/restless 0  Suicidal thoughts 0  PHQ-9 Score 2  Difficult doing work/chores Not difficult at all   Interpretation of Total Score  Total Score Depression Severity:  1-4 = Minimal depression, 5-9 = Mild depression, 10-14 = Moderate depression, 15-19 = Moderately severe depression, 20-27 = Severe depression   Psychosocial Evaluation and Intervention:  Psychosocial Evaluation - 03/16/23 1445       Psychosocial Evaluation & Interventions   Interventions Encouraged to exercise with the program and follow exercise prescription    Comments Walter Knox is coming to pulmonary rehab with dyspnea. His main concern is his stamina. He notes that he is unable to walk as far as he once could. He has lost 60 lbs since January 24, with mainly managing diet and potions and with some help from ozempic but had to stop because of GI issues. His wife has her masters in nutrition so she and he have been working on his diet for a while. When asked about stress, he states he doesn't feel stressed but feels very blessed to have his life. He and his wife are going on another cruise next week which he enjoys and looking forward to. He wants to attend the program to work on his stamina and gain some muscle that he has lost.    Expected Outcomes Short: attend pulmonary rehab for education and exercise. Long; develop and maintain positive self care  habits    Continue Psychosocial Services  Follow up required by staff             Psychosocial Re-Evaluation:  Psychosocial Re-Evaluation     Row Name 04/20/23 0930 05/09/23 9562           Psychosocial Re-Evaluation   Current issues with None Identified None Identified      Comments Patient reports no issues with their current mental states, sleep, stress, depression or anxiety. Will follow up with patient in a few weeks for any changes. Patient reports no issues with their current mental states, sleep, stress, depression or anxiety. Will follow up with patient in a few weeks for any changes.      Expected Outcomes Short: Continue to exercise regularly to support mental health and notify staff of any changes. Long: maintain mental health and well being through teaching of rehab or prescribed medications independently. Short: Continue to exercise regularly to support mental health and notify staff of any changes. Long: maintain mental health and well being through teaching of rehab or prescribed medications independently.      Interventions Encouraged to attend Pulmonary Rehabilitation for the exercise Encouraged to attend Pulmonary Rehabilitation for the exercise      Continue Psychosocial Services  Follow up required by staff Follow up required by staff               Psychosocial Discharge (Final Psychosocial Re-Evaluation):  Psychosocial Re-Evaluation - 05/09/23 0924       Psychosocial Re-Evaluation   Current issues with None Identified    Comments Patient reports no issues with their current mental states, sleep, stress, depression or anxiety. Will follow up with patient in a few weeks for any changes.    Expected Outcomes Short: Continue to exercise regularly to support mental health and notify staff of any changes. Long: maintain mental health and well being through teaching of rehab or prescribed medications independently.    Interventions Encouraged to attend Pulmonary  Rehabilitation for the exercise    Continue Psychosocial Services  Follow up required by staff  Education: Education Goals: Education classes will be provided on a weekly basis, covering required topics. Participant will state understanding/return demonstration of topics presented.  Learning Barriers/Preferences:  Learning Barriers/Preferences - 03/16/23 1443       Learning Barriers/Preferences   Learning Barriers None    Learning Preferences None             General Pulmonary Education Topics:  Infection Prevention: - Provides verbal and written material to individual with discussion of infection control including proper hand washing and proper equipment cleaning during exercise session. Flowsheet Row Pulmonary Rehab from 05/11/2023 in [facility] Cardiac and Pulmonary Rehab  Date 03/27/23  Educator MB  Instruction Review Code 1- Verbalizes Understanding       Falls Prevention: - Provides verbal and written material to individual with discussion of falls prevention and safety. Flowsheet Row Pulmonary Rehab from 05/11/2023 in [facility] Cardiac and Pulmonary Rehab  Date 03/27/23  Educator MB  Instruction Review Code 1- Verbalizes Understanding       Chronic Lung Disease Review: - Group verbal instruction with posters, models, PowerPoint presentations and videos,  to review new updates, new respiratory medications, new advancements in procedures and treatments. Providing information on websites and "800" numbers for continued self-education. Includes information about supplement oxygen, available portable oxygen systems, continuous and intermittent flow rates, oxygen safety, concentrators, and Medicare reimbursement for oxygen. Explanation of Pulmonary Drugs, including class, frequency, complications, importance of spacers, rinsing mouth after steroid MDI's, and proper cleaning methods for nebulizers. Review of basic lung anatomy and physiology related to function,  structure, and complications of lung disease. Review of risk factors. Discussion about methods for diagnosing sleep apnea and types of masks and machines for OSA. Includes a review of the use of types of environmental controls: home humidity, furnaces, filters, dust mite/pet prevention, HEPA vacuums. Discussion about weather changes, air quality and the benefits of nasal washing. Instruction on Warning signs, infection symptoms, calling MD promptly, preventive modes, and value of vaccinations. Review of effective airway clearance, coughing and/or vibration techniques. Emphasizing that all should Create an Action Plan. Written material given at graduation. Flowsheet Row Pulmonary Rehab from 05/11/2023 in [facility] Cardiac and Pulmonary Rehab  Education need identified 03/27/23       AED/CPR: - Group verbal and written instruction with the use of models to demonstrate the basic use of the AED with the basic ABC's of resuscitation.    Anatomy and Cardiac Procedures: - Group verbal and visual presentation and models provide information about basic cardiac anatomy and function. Reviews the testing methods done to diagnose heart disease and the outcomes of the test results. Describes the treatment choices: Medical Management, Angioplasty, or Coronary Bypass Surgery for treating various heart conditions including Myocardial Infarction, Angina, Valve Disease, and Cardiac Arrhythmias.  Written material given at graduation. Flowsheet Row Pulmonary Rehab from 05/11/2023 in [facility] Cardiac and Pulmonary Rehab  Date 05/11/23  Educator SB  Instruction Review Code 1- Verbalizes Understanding       Medication Safety: - Group verbal and visual instruction to review commonly prescribed medications for heart and lung disease. Reviews the medication, class of the drug, and side effects. Includes the steps to properly store meds and maintain the prescription regimen.  Written material given at  graduation.   Other: -Provides group and verbal instruction on various topics (see comments)   Knowledge Questionnaire Score:  Knowledge Questionnaire Score - 03/27/23 1653       Knowledge Questionnaire Score   Pre Score 17/18  Core Components/Risk Factors/Patient Goals at Admission:  Personal Goals and Risk Factors at Admission - 03/27/23 1653       Core Components/Risk Factors/Patient Goals on Admission    Weight Management Yes;Weight Maintenance;Weight Loss   has lost 60 lbs since January of 24- looking to gain muscle   Intervention Weight Management: Develop a combined nutrition and exercise program designed to reach desired caloric intake, while maintaining appropriate intake of nutrient and fiber, sodium and fats, and appropriate energy expenditure required for the weight goal.;Weight Management: Provide education and appropriate resources to help participant work on and attain dietary goals.;Weight Management/Obesity: Establish reasonable short term and long term weight goals.    Admit Weight 270 lb 11.2 oz (122.8 kg)    Goal Weight: Short Term 270 lb (122.5 kg)    Goal Weight: Long Term 270 lb (122.5 kg)    Expected Outcomes Long Term: Adherence to nutrition and physical activity/exercise program aimed toward attainment of established weight goal;Short Term: Continue to assess and modify interventions until short term weight is achieved;Understanding recommendations for meals to include 15-35% energy as protein, 25-35% energy from fat, 35-60% energy from carbohydrates, less than 200mg  of dietary cholesterol, 20-35 gm of total fiber daily;Understanding of distribution of calorie intake throughout the day with the consumption of 4-5 meals/snacks;Weight Maintenance: Understanding of the daily nutrition guidelines, which includes 25-35% calories from fat, 7% or less cal from saturated fats, less than 200mg  cholesterol, less than 1.5gm of sodium, & 5 or more servings of  fruits and vegetables daily    Diabetes Yes    Intervention Provide education about proper nutrition, including hydration, and aerobic/resistive exercise prescription along with prescribed medications to achieve blood glucose in normal ranges: Fasting glucose 65-99 mg/dL;Provide education about signs/symptoms and action to take for hypo/hyperglycemia.    Expected Outcomes Short Term: Participant verbalizes understanding of the signs/symptoms and immediate care of hyper/hypoglycemia, proper foot care and importance of medication, aerobic/resistive exercise and nutrition plan for blood glucose control.;Long Term: Attainment of HbA1C < 7%.    Hypertension Yes    Intervention Provide education on lifestyle modifcations including regular physical activity/exercise, weight management, moderate sodium restriction and increased consumption of fresh fruit, vegetables, and low fat dairy, alcohol moderation, and smoking cessation.;Monitor prescription use compliance.    Expected Outcomes Short Term: Continued assessment and intervention until BP is < 140/81mm HG in hypertensive participants. < 130/16mm HG in hypertensive participants with diabetes, heart failure or chronic kidney disease.;Long Term: Maintenance of blood pressure at goal levels.             Education:Diabetes - Individual verbal and written instruction to review signs/symptoms of diabetes, desired ranges of glucose level fasting, after meals and with exercise. Acknowledge that pre and post exercise glucose checks will be done for 3 sessions at entry of program. Flowsheet Row Pulmonary Rehab from 05/11/2023 in [facility] Cardiac and Pulmonary Rehab  Date 03/27/23  Educator MB  Instruction Review Code 1- Verbalizes Understanding       Know Your Numbers and Heart Failure: - Group verbal and visual instruction to discuss disease risk factors for cardiac and pulmonary disease and treatment options.  Reviews associated critical values for  Overweight/Obesity, Hypertension, Cholesterol, and Diabetes.  Discusses basics of heart failure: signs/symptoms and treatments.  Introduces Heart Failure Zone chart for action plan for heart failure.  Written material given at graduation.   Core Components/Risk Factors/Patient Goals Review:   Goals and Risk Factor Review     Row Name  04/20/23 0926 05/09/23 0930           Core Components/Risk Factors/Patient Goals Review   Personal Goals Review Improve shortness of breath with ADL's Weight Management/Obesity      Review Spoke to patient about their shortness of breath and what they can do to improve. Patient has been informed of breathing techniques when starting the program. Patient is informed to tell staff if they have had any med changes and that certain meds they are taking or not taking can be causing shortness of breath. Walter Knox states that he is more interested in mainting his weight rather than losing weight. He said that he took ozempic over the summer when he weighed 330lb, but knows he feels comfotable at his current weight of around 260-270lbs.      Expected Outcomes Short: Attend [facility] regularly to improve shortness of breath with ADL's. Long: maintain independence with ADL's Short: Continue to manage diet and exercise in order to maintain weight. Long: Attend rehab in order to manage weight.               Core Components/Risk Factors/Patient Goals at Discharge (Final Review):   Goals and Risk Factor Review - 05/09/23 0930       Core Components/Risk Factors/Patient Goals Review   Personal Goals Review Weight Management/Obesity    Review Walter Knox states that he is more interested in mainting his weight rather than losing weight. He said that he took ozempic over the summer when he weighed 330lb, but knows he feels comfotable at his current weight of around 260-270lbs.    Expected Outcomes Short: Continue to manage diet and exercise in order to maintain weight. Long: Attend rehab in  order to manage weight.             ITP Comments:  ITP Comments     Row Name 03/16/23 1452 03/27/23 1639 03/29/23 0936 04/04/23 0937 04/26/23 1243   ITP Comments Initial phone call completed. Diagnosis can be found in [facility] 11/14. EP Orientation scheduled for Monday 12/16 at 2:30. Completed and gym orientation. Initial ITP created and sent for review to Dr. Jinny Sanders, Medical Director. 30 Day review completed. Medical Director ITP review done, changes made as directed, and signed approval by Medical Director.    new to program First full day of exercise!  Patient was oriented to gym and equipment including functions, settings, policies, and procedures.  Patient's individual exercise prescription and treatment plan were reviewed.  All starting workloads were established based on the results of the 6 minute walk test done at initial orientation visit.  The plan for exercise progression was also introduced and progression will be customized based on patient's performance and goals. 30 Day review completed. Medical Director ITP review done, changes made as directed, and signed approval by Medical Director.   new to program    Row Name 05/24/23 0723           ITP Comments 30 Day review completed. Medical Director ITP review done, changes made as directed, and signed approval by Medical Director.                Comments:

## 2023-06-08 ENCOUNTER — Encounter: Payer: Medicare Other | Admitting: *Deleted

## 2023-06-08 DIAGNOSIS — R0609 Other forms of dyspnea: Secondary | ICD-10-CM | POA: Diagnosis not present

## 2023-06-08 NOTE — Progress Notes (Signed)
 Daily Session Note  Patient Details  Name: Walter Knox MRN: [id number] Date of Birth: [birth year] Referring Provider:   Flowsheet Row Pulmonary Rehab from 03/27/2023 in [facility] Cardiac and Pulmonary Rehab  Referring Provider Vida Rigger, MD       Encounter Date: 06/08/2023  Check In:  Session Check In - 06/08/23 0939       Check-In   Supervising physician immediately available to respond to emergencies See telemetry face sheet for immediately available ER MD    Location [facility]-Cardiac & Pulmonary Rehab    Staff Present Susann Givens RN,BSN;Joseph Reino Kent RCP,RRT,BSRT;Noah [place], [place], Exercise Physiologist    Virtual Visit No    Medication changes reported     No    Fall or balance concerns reported    No    Warm-up and Cool-down Performed on first and last piece of equipment    Resistance Training Performed Yes    VAD Patient? No      Pain Assessment   Currently in Pain? No/denies                Social History   Tobacco Use  Smoking Status Former   Current packs/day: 0.00   Average packs/day: 2.0 packs/day for 40.0 years (80.0 ttl pk-yrs)   Types: Cigarettes   Start date: 10/09/1961   Quit date: 10/09/2001   Years since quitting: 21.6  Smokeless Tobacco Never    Goals Met:  Independence with exercise equipment Exercise tolerated well No report of concerns or symptoms today Strength training completed today  Goals Unmet:  Not Applicable  Comments: Pt able to follow exercise prescription today without complaint.  Will continue to monitor for progression.    Dr. Bethann Punches is Medical Director for [facility] Cardiac Rehabilitation.  Dr. Vida Rigger is Medical Director for [facility] Pulmonary Rehabilitation.

## 2023-06-13 ENCOUNTER — Encounter: Payer: Medicare Other | Attending: Pulmonary Disease | Admitting: *Deleted

## 2023-06-13 DIAGNOSIS — R0609 Other forms of dyspnea: Secondary | ICD-10-CM | POA: Diagnosis present

## 2023-06-13 NOTE — Progress Notes (Signed)
 Daily Session Note  Patient Details  Name: Walter Knox MRN: [id number] Date of Birth: 1940-09-01 Referring Provider:   Flowsheet Row Pulmonary Rehab from 03/27/2023 in [facility] Cardiac and Pulmonary Rehab  Referring Provider Vida Rigger, MD       Encounter Date: 06/13/2023  Check In:  Session Check In - 06/13/23 0942       Check-In   Supervising physician immediately available to respond to emergencies See telemetry face sheet for immediately available ER MD    Location [facility]-Cardiac & Pulmonary Rehab    Staff Present Rory Percy, MS, Exercise Physiologist;Bron Snellings, RN, BSN, CCRP;Noah Tickle, BS, Exercise Physiologist    Virtual Visit No    Medication changes reported     No    Fall or balance concerns reported    No    Warm-up and Cool-down Performed on first and last piece of equipment    Resistance Training Performed Yes    VAD Patient? No    PAD/SET Patient? No      Pain Assessment   Currently in Pain? No/denies                Social History   Tobacco Use  Smoking Status Former   Current packs/day: 0.00   Average packs/day: 2.0 packs/day for 40.0 years (80.0 ttl pk-yrs)   Types: Cigarettes   Start date: 10/09/1961   Quit date: 10/09/2001   Years since quitting: 21.6  Smokeless Tobacco Never    Goals Met:  Proper associated with RPD/PD & O2 Sat Independence with exercise equipment Exercise tolerated well No report of concerns or symptoms today  Goals Unmet:  Not Applicable  Comments: Pt able to follow exercise prescription today without complaint.  Will continue to monitor for progression.    Dr. Bethann Punches is Medical Director for [facility] Cardiac Rehabilitation.  Dr. Vida Rigger is Medical Director for [facility] Pulmonary Rehabilitation.

## 2023-06-15 ENCOUNTER — Encounter: Payer: Medicare Other | Admitting: *Deleted

## 2023-06-15 DIAGNOSIS — R0609 Other forms of dyspnea: Secondary | ICD-10-CM | POA: Diagnosis not present

## 2023-06-15 NOTE — Progress Notes (Signed)
 Daily Session Note  Patient Details  Name: Walter Knox MRN: [id number] Date of Birth: 1940/11/25 Referring Provider:   Flowsheet Row Pulmonary Rehab from 03/27/2023 in [facility] Cardiac and Pulmonary Rehab  Referring Provider Vida Rigger, MD       Encounter Date: 06/15/2023  Check In:  Session Check In - 06/15/23 0948       Check-In   Supervising physician immediately available to respond to emergencies See telemetry face sheet for immediately available ER MD    Location [facility]-Cardiac & Pulmonary Rehab    Staff Present Cora Collum, RN, BSN, CCRP;Margaret Best, MS, Exercise Physiologist;Joseph Reino Kent RCP,RRT,BSRT    Virtual Visit No    Medication changes reported     No    Fall or balance concerns reported    No    Warm-up and Cool-down Performed on first and last piece of equipment    Resistance Training Performed No    VAD Patient? No    PAD/SET Patient? No      Pain Assessment   Currently in Pain? No/denies                Social History   Tobacco Use  Smoking Status Former   Current packs/day: 0.00   Average packs/day: 2.0 packs/day for 40.0 years (80.0 ttl pk-yrs)   Types: Cigarettes   Start date: 10/09/1961   Quit date: 10/09/2001   Years since quitting: 21.6  Smokeless Tobacco Never    Goals Met:  Proper associated with RPD/PD & O2 Sat Independence with exercise equipment Exercise tolerated well No report of concerns or symptoms today  Goals Unmet:  Not Applicable  Comments: Pt able to follow exercise prescription today without complaint.  Will continue to monitor for progression.   Dr. Bethann Punches is Medical Director for [facility] Cardiac Rehabilitation.  Dr. Vida Rigger is Medical Director for [facility] Pulmonary Rehabilitation.

## 2023-06-20 ENCOUNTER — Encounter: Payer: Medicare Other | Admitting: *Deleted

## 2023-06-20 DIAGNOSIS — R0609 Other forms of dyspnea: Secondary | ICD-10-CM | POA: Diagnosis not present

## 2023-06-20 NOTE — Progress Notes (Signed)
 Daily Session Note  Patient Details  Name: Walter Knox MRN: [id number] Date of Birth: 07-11-1940 Referring Provider:   Flowsheet Row Pulmonary Rehab from 03/27/2023 in [facility] Cardiac and Pulmonary Rehab  Referring Provider Vida Rigger, MD       Encounter Date: 06/20/2023  Check In:  Session Check In - 06/20/23 0943       Check-In   Supervising physician immediately available to respond to emergencies See telemetry face sheet for immediately available ER MD    Location [facility]-Cardiac & Pulmonary Rehab    Staff Present Cora Collum, RN, BSN, CCRP;Laureen Manson Passey, BS, RRT, CPFT;Margaret Best, MS, Exercise Physiologist    Virtual Visit No    Medication changes reported     No    Fall or balance concerns reported    No    Warm-up and Cool-down Performed on first and last piece of equipment    Resistance Training Performed Yes    VAD Patient? No    PAD/SET Patient? No      Pain Assessment   Currently in Pain? No/denies                Social History   Tobacco Use  Smoking Status Former   Current packs/day: 0.00   Average packs/day: 2.0 packs/day for 40.0 years (80.0 ttl pk-yrs)   Types: Cigarettes   Start date: 10/09/1961   Quit date: 10/09/2001   Years since quitting: 21.7  Smokeless Tobacco Never    Goals Met:  Proper associated with RPD/PD & O2 Sat [place] with exercise equipment Exercise tolerated well No report of concerns or symptoms today  Goals Unmet:  Not Applicable  Comments: Pt able to follow exercise prescription today without complaint.  Will continue to monitor for progression.    Dr. Bethann Punches is Medical Director for [facility] Cardiac Rehabilitation.  Dr. Vida Rigger is Medical Director for [facility] Pulmonary Rehabilitation.

## 2023-06-21 ENCOUNTER — Encounter: Payer: Self-pay | Admitting: *Deleted

## 2023-06-21 DIAGNOSIS — R0609 Other forms of dyspnea: Secondary | ICD-10-CM

## 2023-06-21 NOTE — Progress Notes (Signed)
 Pulmonary Individual Treatment Plan  Patient Details  Name: Walter Knox MRN: [id number] Date of Birth: 1940/09/15 Referring Provider:   Flowsheet Row Pulmonary Rehab from 03/27/2023 in [facility] Cardiac and Pulmonary Rehab  Referring Provider Vida Rigger, MD       Initial Encounter Date:  Flowsheet Row Pulmonary Rehab from 03/27/2023 in [facility] Cardiac and Pulmonary Rehab  Date 03/27/23       Visit Diagnosis: Dyspnea on exertion  Patient's Home Medications on Admission:  Current Outpatient Medications:    acetaminophen (TYLENOL) 500 MG tablet, Take 1,000 mg by mouth 2 (two) times daily., Disp: , Rfl:    amiodarone (PACERONE) 200 MG tablet, Take 200 mg by mouth daily., Disp: , Rfl:    docusate sodium (COLACE) 100 MG capsule, Take 100 mg by mouth 2 (two) times daily., Disp: , Rfl:    ELIQUIS 5 MG TABS tablet, Take 5 mg by mouth 2 (two) times daily., Disp: , Rfl:    ezetimibe (ZETIA) 10 MG tablet, Take 10 mg by mouth at bedtime., Disp: , Rfl:    lovastatin (ALTOPREV) 20 MG 24 hr tablet, Take 20 mg by mouth at bedtime., Disp: , Rfl:    metFORMIN (GLUCOPHAGE-XR) 500 MG 24 hr tablet, Take 500 mg by mouth daily with supper., Disp: , Rfl:    metoprolol tartrate (LOPRESSOR) 100 MG tablet, Take 100 mg by mouth 2 (two) times daily., Disp: , Rfl:    Multiple Vitamin (MULTIVITAMIN) tablet, Take 1 tablet by mouth daily., Disp: , Rfl:    omeprazole (PRILOSEC) 20 MG capsule, Take 20 mg by mouth at bedtime. , Disp: , Rfl:    ondansetron (ZOFRAN-ODT) 8 MG disintegrating tablet, Take 1 tablet (8 mg total) by mouth every 8 (eight) hours as needed., Disp: 20 tablet, Rfl: 0   polycarbophil (FIBERCON) 625 MG tablet, Take 1,875 mg by mouth 2 (two) times daily., Disp: , Rfl:    torsemide (DEMADEX) 20 MG tablet, 20 mg once., Disp: , Rfl:   Past Medical History: Past Medical History:  Diagnosis Date   Anginal pain (HCC)    BPH (benign prostatic hyperplasia)    CAD (coronary artery disease)    Cancer  (HCC)    Chronic back pain    Dyspnea    Dysrhythmia    Environmental and seasonal allergies    Hyperlipidemia    Hypertension    Lower extremity edema    Morbid obesity (HCC)    Pre-diabetes    PVD (peripheral vascular disease) (HCC)    SSS (sick sinus syndrome) (HCC)     Tobacco Use: Social History   Tobacco Use  Smoking Status Former   Current packs/day: 0.00   Average packs/day: 2.0 packs/day for 40.0 years (80.0 ttl pk-yrs)   Types: Cigarettes   Start date: 10/09/1961   Quit date: 10/09/2001   Years since quitting: 21.7  Smokeless Tobacco Never    Labs: Review Flowsheet        No data to display           Pulmonary Assessment Scores:  Pulmonary Assessment Scores     Row Name 03/27/23 1647 04/04/23 0927       ADL UCSD   ADL Phase -- Entry    SOB Score total -- 32    Rest -- 0    Walk -- 0  Starts at 0 and becomes 4    Stairs -- 4    Bath -- 0    Dress -- 0  Shop -- 1      CAT Score   CAT Score 10 --      mMRC Score   mMRC Score 3 --             UCSD: Self-administered rating of dyspnea associated with activities of daily living (ADLs) 6-point scale (0 = "not at all" to 5 = "maximal or unable to do because of breathlessness")  Scoring Scores range from 0 to 120.  Minimally important difference is 5 units  CAT: CAT can identify the health impairment of COPD patients and is better correlated with disease progression.  CAT has a scoring range of zero to 40. The CAT score is classified into four groups of low (less than 10), medium (10 - 20), high (21-30) and very high (31-40) based on the impact level of disease on health status. A CAT score over 10 suggests significant symptoms.  A worsening CAT score could be explained by an exacerbation, poor medication adherence, poor inhaler technique, or progression of COPD or comorbid conditions.  CAT MCID is 2 points  mMRC: mMRC (Modified Medical Research Council) Dyspnea Scale is used to assess the  degree of baseline functional disability in patients of respiratory disease due to dyspnea. No minimal important difference is established. A decrease in score of 1 point or greater is considered a positive change.   Pulmonary Function Assessment:   Exercise Target Goals: Exercise Program Goal: Individual exercise prescription set using results from initial 6 min walk test and THRR while considering  patient's activity barriers and safety.   Exercise Prescription Goal: Initial exercise prescription builds to 30-45 minutes a day of aerobic activity, 2-3 days per week.  Home exercise guidelines will be given to patient during program as part of exercise prescription that the participant will acknowledge.  Education: Aerobic Exercise: - Group verbal and visual presentation on the components of exercise prescription. Introduces F.I.T.T principle from ACSM for exercise prescriptions.  Reviews F.I.T.T. principles of aerobic exercise including progression. Written material given at graduation.   Education: Resistance Exercise: - Group verbal and visual presentation on the components of exercise prescription. Introduces F.I.T.T principle from ACSM for exercise prescriptions  Reviews F.I.T.T. principles of resistance exercise including progression. Written material given at graduation. Flowsheet Row Pulmonary Rehab from 05/11/2023 in [facility] Cardiac and Pulmonary Rehab  Date 04/13/23  Educator MB  Instruction Review Code 1- [employer] Understanding        Education: Exercise & Equipment Safety: - Individual verbal instruction and demonstration of equipment use and safety with use of the equipment. Flowsheet Row Pulmonary Rehab from 05/11/2023 in [facility] Cardiac and Pulmonary Rehab  Date 03/27/23  Educator MB  Instruction Review Code 1- Verbalizes Understanding       Education: Exercise Physiology & General Exercise Guidelines: - Group verbal and written instruction with models to review the  exercise physiology of the cardiovascular system and associated critical values. Provides general exercise guidelines with specific guidelines to those with heart or lung disease.    Education: Flexibility, Balance, Mind/Body Relaxation: - Group verbal and visual presentation with interactive activity on the components of exercise prescription. Introduces F.I.T.T principle from ACSM for exercise prescriptions. Reviews F.I.T.T. principles of flexibility and balance exercise training including progression. Also discusses the mind body connection.  Reviews various relaxation techniques to help reduce and manage stress (i.e. Deep breathing, progressive muscle relaxation, and visualization). Balance handout provided to take home. Written material given at graduation. Flowsheet Row Pulmonary Rehab from 05/11/2023 in [facility]  Cardiac and Pulmonary Rehab  Date 04/13/23  Educator MB  Instruction Review Code 1- Verbalizes Understanding       Activity Barriers & Risk Stratification:  Activity Barriers & Cardiac Risk Stratification - 03/27/23 1641       Activity Barriers & Cardiac Risk Stratification   Activity Barriers Left Knee Replacement;Right Knee Replacement;Deconditioning             6 Minute Walk:  6 Minute Walk     Row Name 03/27/23 1640         6 Minute Walk   Phase Initial     Distance 1185 feet     Walk Time 6 minutes     # of Rest Breaks 0     MPH 2.24     METS 1.45     RPE 5.07     Perceived Dyspnea  3     VO2 Peak 5.07     Symptoms Yes (comment)     Comments SOB     Resting HR 60 bpm     Resting BP 122/80     Resting Oxygen Saturation  93 %     Exercise Oxygen Saturation  during 6 min walk 94 %     Max Ex. HR 101 bpm     Max Ex. BP 154/76     2 Minute Post BP 126/76       Interval HR   1 Minute HR 96     2 Minute HR 101     3 Minute HR 80     4 Minute HR 84     5 Minute HR 84     6 Minute HR 90     2 Minute Post HR 60     Interval Heart Rate? Yes        Interval Oxygen   Interval Oxygen? Yes     Baseline Oxygen Saturation % 93 %     1 Minute Oxygen Saturation % 94 %     1 Minute Liters of Oxygen 0 L     2 Minute Oxygen Saturation % 95 %     2 Minute Liters of Oxygen 0 L     3 Minute Oxygen Saturation % 95 %     3 Minute Liters of Oxygen 0 L     4 Minute Oxygen Saturation % 96 %     4 Minute Liters of Oxygen 0 L     5 Minute Oxygen Saturation % 96 %     5 Minute Liters of Oxygen 0 L     6 Minute Oxygen Saturation % 96 %     6 Minute Liters of Oxygen 0 L     2 Minute Post Oxygen Saturation % 97 %     2 Minute Post Liters of Oxygen 0 L             Oxygen Initial Assessment:  Oxygen Initial Assessment - 03/16/23 1431       Home Oxygen   Home Oxygen Device None    Sleep Oxygen Prescription None    Home Exercise Oxygen Prescription None    Home Resting Oxygen Prescription None    Compliance with Home Oxygen Use Yes      Intervention   Short Term Goals To learn and understand importance of monitoring SPO2 with pulse oximeter and demonstrate accurate use of the pulse oximeter.;To learn and understand importance of maintaining oxygen saturations>88%;To learn and demonstrate proper pursed lip breathing  techniques or other breathing techniques. ;To learn and demonstrate proper use of respiratory medications    Long  Term Goals Verbalizes importance of monitoring SPO2 with pulse oximeter and return demonstration;Maintenance of O2 saturations>88%;Exhibits proper breathing techniques, such as pursed lip breathing or other method taught during program session;Compliance with respiratory medication;Demonstrates proper use of MDI's             Oxygen Re-Evaluation:  Oxygen Re-Evaluation     Row Name 04/04/23 0934 05/09/23 0933           Program Oxygen Prescription   Program Oxygen Prescription None None        Home Oxygen   Home Oxygen Device None None      Sleep Oxygen Prescription None None      Home Exercise Oxygen  Prescription None None      Home Resting Oxygen Prescription None None      Compliance with Home Oxygen Use Yes Yes        Goals/Expected Outcomes   Short Term Goals To learn and demonstrate proper pursed lip breathing techniques or other breathing techniques.  To learn and demonstrate proper pursed lip breathing techniques or other breathing techniques.       Long  Term Goals Exhibits proper breathing techniques, such as pursed lip breathing or other method taught during program session Exhibits proper breathing techniques, such as pursed lip breathing or other method taught during program session      Comments Reviewed PLB technique with pt.  Talked about how it works and it's importance in maintaining their exercise saturations. Reviewed PLB technique with pt.  Talked about how it works and it's importance in maintaining their exercise saturations.      Goals/Expected Outcomes Short: Become more profiecient at using PLB. Long: Become independent at using PLB. Short: Become more profiecient at using PLB. Long: Become independent at using PLB.               Oxygen Discharge (Final Oxygen Re-Evaluation):  Oxygen Re-Evaluation - 05/09/23 0933       Program Oxygen Prescription   Program Oxygen Prescription None      Home Oxygen   Home Oxygen Device None    Sleep Oxygen Prescription None    Home Exercise Oxygen Prescription None    Home Resting Oxygen Prescription None    Compliance with Home Oxygen Use Yes      Goals/Expected Outcomes   Short Term Goals To learn and demonstrate proper pursed lip breathing techniques or other breathing techniques.     Long  Term Goals Exhibits proper breathing techniques, such as pursed lip breathing or other method taught during program session    Comments Reviewed PLB technique with pt.  Talked about how it works and it's importance in maintaining their exercise saturations.    Goals/Expected Outcomes Short: Become more profiecient at using PLB.  Long: Become independent at using PLB.             Initial Exercise Prescription:  Initial Exercise Prescription - 03/27/23 1600       Date of Initial Exercise RX and Referring Provider   Date 03/27/23    Referring Provider Vida Rigger, MD      Oxygen   Maintain Oxygen Saturation 88% or higher      Treadmill   MPH 2    Grade 0    Minutes 15    METs 2.53      Recumbant Bike   Level 1  RPM 50    Watts 15    Minutes 15    METs 1.45      NuStep   Level 1    SPM 80    Minutes 15    METs 1.45      Track   Laps 28    Minutes 15    METs 2.52      Prescription Details   Frequency (times per week) 2    Duration Progress to 30 minutes of continuous aerobic without signs/symptoms of physical distress      Intensity   THRR 40-80% of Max Heartrate 90-121    Ratings of Perceived Exertion 11-13    Perceived Dyspnea 0-4      Progression   Progression Continue to progress workloads to maintain intensity without signs/symptoms of physical distress.      Resistance Training   Training Prescription Yes    Weight 7 lb    Reps 10-15             Perform Capillary Blood Glucose checks as needed.  Exercise Prescription Changes:   Exercise Prescription Changes     Row Name 03/27/23 1600 04/19/23 1300 05/02/23 1000 05/03/23 1400 05/16/23 0700     Response to Exercise   Blood Pressure (Admit) 122/80 120/80 120/80 104/60 126/74   Blood Pressure (Exercise) 154/76 142/68 142/68 128/70 126/88   Blood Pressure (Exit) 126/76 106/62 106/62 102/64 98/58   Heart Rate (Admit) 60 bpm 60 bpm 60 bpm 79 bpm 83 bpm   Heart Rate (Exercise) 101 bpm 95 bpm 95 bpm 107 bpm 108 bpm   Heart Rate (Exit) 60 bpm 81 bpm 81 bpm 85 bpm 78 bpm   Oxygen Saturation (Admit) 93 % 93 % 93 % 96 % 95 %   Oxygen Saturation (Exercise) 94 % 93 % 93 % 95 % 93 %   Oxygen Saturation (Exit) 97 % 96 % 96 % 97 % 93 %   Rating of Perceived Exertion (Exercise) 15 15 15 14 14    Perceived Dyspnea  (Exercise) 3 2 2 3  --   Symptoms SOB -- -- none none   Comments results -- -- -- --   Duration Progress to 30 minutes of  aerobic without signs/symptoms of physical distress Progress to 30 minutes of  aerobic without signs/symptoms of physical distress Progress to 30 minutes of  aerobic without signs/symptoms of physical distress Progress to 30 minutes of  aerobic without signs/symptoms of physical distress Progress to 30 minutes of  aerobic without signs/symptoms of physical distress   Intensity THRR New THRR unchanged THRR unchanged THRR unchanged THRR unchanged     Progression   Progression Continue to progress workloads to maintain intensity without signs/symptoms of physical distress. Continue to progress workloads to maintain intensity without signs/symptoms of physical distress. Continue to progress workloads to maintain intensity without signs/symptoms of physical distress. Continue to progress workloads to maintain intensity without signs/symptoms of physical distress. Continue to progress workloads to maintain intensity without signs/symptoms of physical distress.   Average METs 1.45 3.5 3.5 3.66 3.98     Resistance Training   Training Prescription -- Yes Yes Yes Yes   Weight -- 7 lb 7 lb 7 lb 7 lb   Reps -- 10-15 10-15 10-15 10-15     Interval Training   Interval Training -- No No No No     Treadmill   MPH -- 2 2 2.2 2.8   Grade -- 0 0 0 0  Minutes -- 15 15 15 15    METs -- 2.53 2.53 2.69 3.14     NuStep   Level -- 4 4 5 5    Minutes -- 15 15 15 15    METs -- 5.6 5.6 5.2 5.8     Home Exercise Plan   Plans to continue exercise at -- -- [employer] (comment)  Looking at a few different gyms ([place], [employer], or [place]) to do aerobic and resistance exercise. Will also golf some. [employer] (comment)  Looking at a few different gyms ([place], [employer], or [place]) to do aerobic and resistance exercise. Will also golf some. [employer] (comment)  Looking at a few  different gyms ([place], [employer], or [place]) to do aerobic and resistance exercise. Will also golf some.   Frequency -- -- Add 3 additional days to program exercise sessions. Add 3 additional days to program exercise sessions. Add 3 additional days to program exercise sessions.   Initial Home Exercises Provided -- -- 05/02/23 05/02/23 05/02/23     Oxygen   Maintain Oxygen Saturation -- 88% or higher 88% or higher 88% or higher 88% or higher    Row Name 06/01/23 1700 06/13/23 1500           Response to Exercise   Blood Pressure (Admit) 118/68 100/74      Blood Pressure (Exercise) 122/64 --      Blood Pressure (Exit) 126/70 104/58      Heart Rate (Admit) 71 bpm 60 bpm      Heart Rate (Exercise) 115 bpm 80 bpm      Heart Rate (Exit) 88 bpm --      Oxygen Saturation (Admit) 97 % 96 %      Oxygen Saturation (Exercise) 93 % 93 %      Oxygen Saturation (Exit) 97 % 97 %      Rating of Perceived Exertion (Exercise) 16 16      Perceived Dyspnea (Exercise) 3 3      Symptoms none none      Duration Progress to 30 minutes of  aerobic without signs/symptoms of physical distress Continue with 30 min of aerobic exercise without signs/symptoms of physical distress.      Intensity THRR unchanged THRR unchanged        Progression   Progression Continue to progress workloads to maintain intensity without signs/symptoms of physical distress. Continue to progress workloads to maintain intensity without signs/symptoms of physical distress.      Average METs 3.9 3.22        Resistance Training   Training Prescription Yes Yes      Weight 7 lb 7 lb      Reps 10-15 10-15        Interval Training   Interval Training No No        Treadmill   MPH 2.8 2.8      Grade 0 0      Minutes 15 15      METs 3.14 3.14        NuStep   Level 6 --      Minutes 15 --      METs 6.5 --        T5 Nustep   Level -- 5      SPM -- 80      Minutes -- 15      METs -- 3.3        Home Exercise Plan   Plans to  continue exercise at [facility] (comment)  Looking at a few different gyms ([place], [employer], or [place]) to do aerobic and resistance exercise. Will also golf some. --      Frequency Add 3 additional days to program exercise sessions. --      Initial Home Exercises Provided 05/02/23 --        Oxygen   Maintain Oxygen Saturation 88% or higher --               Exercise Comments:   Exercise Comments     Row Name 04/04/23 9147           Exercise Comments First full day of exercise!  Patient was oriented to gym and equipment including functions, settings, policies, and procedures.  Patient's individual exercise prescription and treatment plan were reviewed.  All starting workloads were established based on the results of the 6 minute walk test done at initial orientation visit.  The plan for exercise progression was also introduced and progression will be customized based on patient's performance and goals.                Exercise Goals and Review:   Exercise Goals     Row Name 03/27/23 1646             Exercise Goals   Increase Physical Activity Yes       Intervention Provide advice, education, support and counseling about physical activity/exercise needs.;Develop an individualized exercise prescription for aerobic and resistive training based on initial evaluation findings, risk stratification, comorbidities and participant's personal goals.       Expected Outcomes Short Term: Attend rehab on a regular basis to increase amount of physical activity.;Long Term: Add in home exercise to make exercise part of routine and to increase amount of physical activity.;Long Term: Exercising regularly at least 3-5 days a week.       Increase Strength and Stamina Yes       Intervention Provide advice, education, support and counseling about physical activity/exercise needs.;Develop an individualized exercise prescription for aerobic and resistive training based on initial evaluation  findings, risk stratification, comorbidities and participant's personal goals.       Expected Outcomes Short Term: Increase workloads from initial exercise prescription for resistance, speed, and METs.;Short Term: Perform resistance training exercises routinely during rehab and add in resistance training at home;Long Term: Improve cardiorespiratory fitness, muscular endurance and strength as measured by increased METs and functional capacity ( )       Able to understand and use rate of perceived exertion (RPE) scale Yes       Intervention Provide education and explanation on how to use RPE scale       Expected Outcomes Short Term: Able to use RPE daily in rehab to express subjective intensity level;Long Term:  Able to use RPE to guide intensity level when exercising independently       Able to understand and use Dyspnea scale Yes       Intervention Provide education and explanation on how to use Dyspnea scale       Expected Outcomes Short Term: Able to use Dyspnea scale daily in rehab to express subjective sense of shortness of breath during exertion;Long Term: Able to use Dyspnea scale to guide intensity level when exercising independently       Knowledge and understanding of Target Heart Rate Range (THRR) Yes       Intervention Provide education and explanation of THRR including how the numbers were predicted and where they are located  for reference       Expected Outcomes Short Term: Able to state/look up THRR;Short Term: Able to use daily as guideline for intensity in rehab;Long Term: Able to use THRR to govern intensity when exercising independently       Able to check pulse independently Yes       Intervention Provide education and demonstration on how to check pulse in carotid and radial arteries.;Review the importance of being able to check your own pulse for safety during independent exercise       Expected Outcomes Short Term: Able to explain why pulse checking is important during  independent exercise;Long Term: Able to check pulse independently and accurately       Understanding of Exercise Prescription Yes       Intervention Provide education, explanation, and written materials on patient's individual exercise prescription       Expected Outcomes Short Term: Able to explain program exercise prescription;Long Term: Able to explain home exercise prescription to exercise independently                Exercise Goals Re-Evaluation :  Exercise Goals Re-Evaluation     Row Name 04/04/23 0935 04/19/23 1357 05/02/23 1033 05/03/23 1427 05/16/23 0757     Exercise Goal Re-Evaluation   Exercise Goals Review Able to understand and use rate of perceived exertion (RPE) scale;Knowledge and understanding of Target Heart Rate Range (THRR);Understanding of Exercise Prescription;Able to understand and use Dyspnea scale Increase Physical Activity;Increase Strength and Stamina;Understanding of Exercise Prescription Able to check pulse independently;Able to understand and use Dyspnea scale;Increase Strength and Stamina;Understanding of Exercise Prescription;Knowledge and understanding of Target Heart Rate Range (THRR);Able to understand and use rate of perceived exertion (RPE) scale;Increase Physical Activity Increase Physical Activity;Increase Strength and Stamina;Understanding of Exercise Prescription Increase Physical Activity;Increase Strength and Stamina;Understanding of Exercise Prescription   Comments Reviewed RPE and dyspnea scale, THR and program prescription with pt today.  Pt voiced understanding and was given a copy of goals to take home. Ed is off to a great start in the program. He has attended his first 4 sessions of the program, and has been able to increase to level 4 on the T4 nustep. He is also off to a great start on the treadmill at a workload of and no incline. We will continue to monitor his progress in the program. Reviewed home exercise with pt today from 9:37am to  10:00am.  Pt plans to go to a gym facility ([place], [employer], or [place]) for aerobic and resistance exercise 3 additional days for exercise. He also will golf 2 days a week when the weather is appropriate. Reviewed THR, pulse, RPE, sign and symptoms, pulse oximetery and when to call 911 or MD.  Also discussed weather considerations and indoor options.  Pt voiced understanding. Ed continues to do well in rehab. He was recently able to increase his speed on the treadmill from to 2.43mph. He was also able to increase his level on the T4 nustep from level 4 to 5. We will continue to monitor his progress in the program. Ed is doing well in the program. He has recently been able to increase his speed on the treadmill from 2.2 to 2.96mph. He was also able to maintain an intensity of level 5 on the T4 nustep. We will continue to monitor his progress in the program.   Expected Outcomes Short: Use RPE daily to regulate intensity. Long: Follow program prescription in THR. Short: Continue to dollow exercise  prescription and increase workloads when able. Long: Continue exercise to improve strength and stamina. Short: Add 3 additional days at home for exercise. Long: Continue to exercise independently for 5 days a week to improve strength and stamina. Short: Continue to increase treadmill workloads. Long: Continue exercise to increase strength and stamina. Short: Continue to increase treadmill workloads. Long: Continue exercise to increase strength and stamina.    Row Name 06/01/23 1746 06/13/23 1512           Exercise Goal Re-Evaluation   Exercise Goals Review Increase Physical Activity;Increase Strength and Stamina;Understanding of Exercise Prescription Increase Physical Activity;Increase Strength and Stamina;Understanding of Exercise Prescription      Comments Ed is doing well in rehab. He was able to increase his level on the T4 nustep from 5 to 6. He was also able to maintain his speed of 2. on the treadmill.  We will continue to monitor his progress in the program. Ed returned from his cruise/trip and completed 1 session in this review. He was able to maintain his speed of 2. on the treadmill and worked at level 5 on the T5 nustep as he gets back to the program. We will continue to monitor his progress in the program.      Expected Outcomes Short: Continue to increase treadmill workloads. Long: Continue exercise to increase strength and stamina. Short: Continue to progressively increase treadmill and T5 nustep workloads. Long: Continue exercise to improve strength and stamina.               Discharge Exercise Prescription (Final Exercise Prescription Changes):  Exercise Prescription Changes - 06/13/23 1500       Response to Exercise   Blood Pressure (Admit) 100/74    Blood Pressure (Exit) 104/58    Heart Rate (Admit) 60 bpm    Heart Rate (Exercise) 80 bpm    Oxygen Saturation (Admit) 96 %    Oxygen Saturation (Exercise) 93 %    Oxygen Saturation (Exit) 97 %    Rating of Perceived Exertion (Exercise) 16    Perceived Dyspnea (Exercise) 3    Symptoms none    Duration Continue with 30 min of aerobic exercise without signs/symptoms of physical distress.    Intensity THRR unchanged      Progression   Progression Continue to progress workloads to maintain intensity without signs/symptoms of physical distress.    Average METs 3.22      Resistance Training   Training Prescription Yes    Weight 7 lb    Reps 10-15      Interval Training   Interval Training No      Treadmill   MPH 2.8    Grade 0    Minutes 15    METs 3.14      T5 Nustep   Level 5    SPM 80    Minutes 15    METs 3.3             Nutrition:  Target Goals: Understanding of nutrition guidelines, daily intake of sodium 1500mg , cholesterol 200mg , calories 30% from fat and 7% or less from saturated fats, daily to have 5 or more servings of fruits and vegetables.  Education: All About Nutrition: -Group  instruction provided by verbal, written material, interactive activities, discussions, models, and posters to present general guidelines for heart healthy nutrition including fat, fiber, MyPlate, the role of sodium in heart healthy nutrition, utilization of the nutrition label, and utilization of this knowledge for meal planning. Follow up  email sent as well. Written material given at graduation.   Biometrics:  Pre Biometrics - 03/27/23 1647       Pre Biometrics   Height 5' 9.2" (1.758 m)    Weight 270 lb 11.2 oz (122.8 kg)    Waist Circumference 56.5 inches    Hip Circumference 55 inches    Waist to Hip Ratio 1.03 %    BMI (Calculated) 39.73    Single Leg Stand 4.5 seconds              Nutrition Therapy Plan and Nutrition Goals:  Nutrition Therapy & Goals - 03/27/23 1650       Nutrition Therapy   RD appointment deferred Yes      Personal Nutrition Goals   Nutrition Goal RD appointment deferred, has another nutrition plan in place      Intervention Plan   Intervention Prescribe, educate and counsel regarding individualized specific dietary modifications aiming towards targeted core components such as weight, hypertension, lipid management, diabetes, heart failure and other comorbidities.;Nutrition handout(s) given to patient.    Expected Outcomes Short Term Goal: Understand basic principles of dietary content, such as calories, fat, sodium, cholesterol and nutrients.;Short Term Goal: A plan has been developed with personal nutrition goals set during dietitian appointment.;Long Term Goal: Adherence to prescribed nutrition plan.             Nutrition Assessments:  MEDIFICTS Score Key: >=70 Need to make dietary changes  40-70 Heart Healthy Diet <= 40 Therapeutic Level Cholesterol Diet  Flowsheet Row Pulmonary Rehab from 03/27/2023 in [facility]  Picture Your Plate Total Score on Admission 57      Picture Your Plate Scores: <16 Unhealthy  dietary pattern with much room for improvement. 41-50 Dietary pattern unlikely to meet recommendations for good health and room for improvement. 51-60 More healthful dietary pattern, with some room for improvement.  >60 Healthy dietary pattern, although there may be some specific behaviors that could be improved.   Nutrition Goals Re-Evaluation:  Nutrition Goals Re-Evaluation     Row Name 04/20/23 0927 05/09/23 0926           Goals   Current Weight 278 lb (126.1 kg) 272 lb 4.8 oz (123.5 kg)      Nutrition Goal -- RD appointment deferred, has another nutrition plan in place      Comment Patient was informed on why it is important to maintain a balanced diet when dealing with Respiratory issues. Explained that it takes a lot of energy to breath and when they are short of breath often they will need to have a good diet to help keep up with the calories they are expending for breathing. Patient was informed on why it is important to maintain a balanced diet when dealing with Respiratory issues. Ed says that he is still maintaining his diet in order to have enough energy for exercise and manage his weight.      Expected Outcome Short: Choose and plan snacks accordingly to patients caloric intake to improve breathing. Long: Maintain a diet independently that meets their caloric intake to aid in daily shortness of breath. Short: Choose and plan snacks accordingly to patients caloric intake to improve breathing. Long: Maintain a diet independently that meets their caloric intake to aid in daily shortness of breath.               Nutrition Goals Discharge (Final Nutrition Goals Re-Evaluation):  Nutrition Goals Re-Evaluation - 05/09/23 1096  Goals   Current Weight 272 lb 4.8 oz (123.5 kg)    Nutrition Goal RD appointment deferred, has another nutrition plan in place    Comment Patient was informed on why it is important to maintain a balanced diet when dealing with Respiratory issues. Ed  says that he is still maintaining his diet in order to have enough energy for exercise and manage his weight.    Expected Outcome Short: Choose and plan snacks accordingly to patients caloric intake to improve breathing. Long: Maintain a diet independently that meets their caloric intake to aid in daily shortness of breath.             Psychosocial: Target Goals: Acknowledge presence or absence of significant depression and/or stress, maximize coping skills, provide positive support system. Participant is able to verbalize types and ability to use techniques and skills needed for reducing stress and depression.   Education: Stress, Anxiety, and Depression - Group verbal and visual presentation to define topics covered.  Reviews how body is impacted by stress, anxiety, and depression.  Also discusses healthy ways to reduce stress and to treat/manage anxiety and depression.  Written material given at graduation.   Education: Sleep Hygiene -Provides group verbal and written instruction about how sleep can affect your health.  Define sleep hygiene, discuss sleep cycles and impact of sleep habits. Review good sleep hygiene tips.    Initial Review & Psychosocial Screening:  Initial Psych Review & Screening - 03/16/23 1443       Initial Review   Current issues with None Identified      Family Dynamics   Good Support System? Yes   wife     Barriers   Psychosocial barriers to participate in program There are no identifiable barriers or psychosocial needs.;The patient should benefit from training in stress management and relaxation.      Screening Interventions   Interventions Encouraged to exercise;Provide feedback about the scores to participant;To provide support and resources with identified psychosocial needs    Expected Outcomes Short Term goal: Utilizing psychosocial counselor, staff and physician to assist with identification of specific Stressors or current issues interfering with  healing process. Setting desired goal for each stressor or current issue identified.;Long Term Goal: Stressors or current issues are controlled or eliminated.;Short Term goal: Identification and review with participant of any Quality of Life or Depression concerns found by scoring the questionnaire.;Long Term goal: The participant improves quality of Life and PHQ9 Scores as seen by post scores and/or verbalization of changes             Quality of Life Scores:  Scores of 19 and below usually indicate a poorer quality of life in these areas.  A difference of  2-3 points is a clinically meaningful difference.  A difference of 2-3 points in the total score of the Quality of Life Index has been associated with significant improvement in overall quality of life, self-image, physical symptoms, and general health in studies assessing change in quality of life.  PHQ-9: Review Flowsheet       03/27/2023  Depression screen PHQ 2/9  Decreased Interest 0  Down, Depressed, Hopeless 0  PHQ - 2 Score 0  Altered sleeping 1  Tired, decreased energy 1  Change in appetite 0  Feeling bad or failure about yourself  0  Trouble concentrating 0  Moving slowly or fidgety/restless 0  Suicidal thoughts 0  PHQ-9 Score 2  Difficult doing work/chores Not difficult at all   Interpretation  of Total Score  Total Score Depression Severity:  1-4 = Minimal depression, 5-9 = Mild depression, 10-14 = Moderate depression, 15-19 = Moderately severe depression, 20-27 = Severe depression   Psychosocial Evaluation and Intervention:  Psychosocial Evaluation - 03/16/23 1445       Psychosocial Evaluation & Interventions   Interventions Encouraged to exercise with the program and follow exercise prescription    Comments Ed is coming to pulmonary rehab with dyspnea. His main concern is his stamina. He notes that he is unable to walk as far as he once could. He has lost 60 lbs since January 24, with mainly managing diet and  potions and with some help from ozempic but had to stop because of GI issues. His wife has her masters in nutrition so she and he have been working on his diet for a while. When asked about stress, he states he doesn't feel stressed but feels very blessed to have his life. He and his wife are going on another cruise next week which he enjoys and looking forward to. He wants to attend the program to work on his stamina and gain some muscle that he has lost.    Expected Outcomes Short: attend pulmonary rehab for education and exercise. Long; develop and maintain positive self care habits    Continue Psychosocial Services  Follow up required by staff             Psychosocial Re-Evaluation:  Psychosocial Re-Evaluation     Row Name 04/20/23 0930 05/09/23 1610           Psychosocial Re-Evaluation   Current issues with None Identified None Identified      Comments Patient reports no issues with their current mental states, sleep, stress, depression or anxiety. Will follow up with patient in a few weeks for any changes. Patient reports no issues with their current mental states, sleep, stress, depression or anxiety. Will follow up with patient in a few weeks for any changes.      Expected Outcomes Short: Continue to exercise regularly to support mental health and notify staff of any changes. Long: maintain mental health and well being through teaching of rehab or prescribed medications independently. Short: Continue to exercise regularly to support mental health and notify staff of any changes. Long: maintain mental health and well being through teaching of rehab or prescribed medications independently.      Interventions Encouraged to attend Pulmonary Rehabilitation for the exercise Encouraged to attend Pulmonary Rehabilitation for the exercise      Continue Psychosocial Services  Follow up required by staff Follow up required by staff               Psychosocial Discharge (Final Psychosocial  Re-Evaluation):  Psychosocial Re-Evaluation - 05/09/23 0924       Psychosocial Re-Evaluation   Current issues with None Identified    Comments Patient reports no issues with their current mental states, sleep, stress, depression or anxiety. Will follow up with patient in a few weeks for any changes.    Expected Outcomes Short: Continue to exercise regularly to support mental health and notify staff of any changes. Long: maintain mental health and well being through teaching of rehab or prescribed medications independently.    Interventions Encouraged to attend Pulmonary Rehabilitation for the exercise    Continue Psychosocial Services  Follow up required by staff             Education: Education Goals: Education classes will be provided on a  weekly basis, covering required topics. Participant will state understanding/return demonstration of topics presented.  Learning Barriers/Preferences:  Learning Barriers/Preferences - 03/16/23 1443       Learning Barriers/Preferences   Learning Barriers None    Learning Preferences None             General Pulmonary Education Topics:  Infection Prevention: - Provides verbal and written material to individual with discussion of infection control including proper hand washing and proper equipment cleaning during exercise session. Flowsheet Row Pulmonary Rehab from 05/11/2023 in [facility] Cardiac and Pulmonary Rehab  Date 03/27/23  Educator MB  Instruction Review Code 1- Verbalizes Understanding       Falls Prevention: - Provides verbal and written material to individual with discussion of falls prevention and safety. Flowsheet Row Pulmonary Rehab from 05/11/2023 in [facility] Cardiac and Pulmonary Rehab  Date 03/27/23  Educator MB  Instruction Review Code 1- Verbalizes Understanding       Chronic Lung Disease Review: - Group verbal instruction with posters, models, PowerPoint presentations and videos,  to review new updates, new  respiratory medications, new advancements in procedures and treatments. Providing information on websites and "800" numbers for continued self-education. Includes information about supplement oxygen, available portable oxygen systems, continuous and intermittent flow rates, oxygen safety, concentrators, and Medicare reimbursement for oxygen. Explanation of Pulmonary Drugs, including class, frequency, complications, importance of spacers, rinsing mouth after steroid MDI's, and proper cleaning methods for nebulizers. Review of basic lung anatomy and physiology related to function, structure, and complications of lung disease. Review of risk factors. Discussion about methods for diagnosing sleep apnea and types of masks and machines for OSA. Includes a review of the use of types of environmental controls: home humidity, furnaces, filters, dust mite/pet prevention, HEPA vacuums. Discussion about weather changes, air quality and the benefits of nasal washing. Instruction on Warning signs, infection symptoms, calling MD promptly, preventive modes, and value of vaccinations. Review of effective airway clearance, coughing and/or vibration techniques. Emphasizing that all should Create an Action Plan. Written material given at graduation. Flowsheet Row Pulmonary Rehab from 05/11/2023 in [facility] Cardiac and Pulmonary Rehab  Education need identified 03/27/23       AED/CPR: - Group verbal and written instruction with the use of models to demonstrate the basic use of the AED with the basic ABC's of resuscitation.    Anatomy and Cardiac Procedures: - Group verbal and visual presentation and models provide information about basic cardiac anatomy and function. Reviews the testing methods done to diagnose heart disease and the outcomes of the test results. Describes the treatment choices: Medical Management, Angioplasty, or Coronary Bypass Surgery for treating various heart conditions including Myocardial Infarction,  Angina, Valve Disease, and Cardiac Arrhythmias.  Written material given at graduation. Flowsheet Row Pulmonary Rehab from 05/11/2023 in [facility] Cardiac and Pulmonary Rehab  Date 05/11/23  Educator SB  Instruction Review Code 1- Verbalizes Understanding       Medication Safety: - Group verbal and visual instruction to review commonly prescribed medications for heart and lung disease. Reviews the medication, class of the drug, and side effects. Includes the steps to properly store meds and maintain the prescription regimen.  Written material given at graduation.   Other: -Provides group and verbal instruction on various topics (see comments)   Knowledge Questionnaire Score:  Knowledge Questionnaire Score - 03/27/23 1653       Knowledge Questionnaire Score   Pre Score 17/18  Core Components/Risk Factors/Patient Goals at Admission:  Personal Goals and Risk Factors at Admission - 03/27/23 1653       Core Components/Risk Factors/Patient Goals on Admission    Weight Management Yes;Weight Maintenance;Weight Loss   has lost 60 lbs since January of 24- looking to gain muscle   Intervention Weight Management: Develop a combined nutrition and exercise program designed to reach desired caloric intake, while maintaining appropriate intake of nutrient and fiber, sodium and fats, and appropriate energy expenditure required for the weight goal.;Weight Management: Provide education and appropriate resources to help participant work on and attain dietary goals.;Weight Management/Obesity: Establish reasonable short term and long term weight goals.    Admit Weight 270 lb 11.2 oz (122.8 kg)    Goal Weight: Short Term 270 lb (122.5 kg)    Goal Weight: Long Term 270 lb (122.5 kg)    Expected Outcomes Long Term: Adherence to nutrition and physical activity/exercise program aimed toward attainment of established weight goal;Short Term: Continue to assess and modify interventions until short  term weight is achieved;Understanding recommendations for meals to include 15-35% energy as protein, 25-35% energy from fat, 35-60% energy from carbohydrates, less than 200mg  of dietary cholesterol, 20-35 gm of total fiber daily;Understanding of distribution of calorie intake throughout the day with the consumption of 4-5 meals/snacks;Weight Maintenance: Understanding of the daily nutrition guidelines, which includes 25-35% calories from fat, 7% or less cal from saturated fats, less than 200mg  cholesterol, less than 1.5gm of sodium, & 5 or more servings of fruits and vegetables daily    Diabetes Yes    Intervention Provide education about proper nutrition, including hydration, and aerobic/resistive exercise prescription along with prescribed medications to achieve blood glucose in normal ranges: Fasting glucose 65-99 mg/dL;Provide education about signs/symptoms and action to take for hypo/hyperglycemia.    Expected Outcomes Short Term: Participant verbalizes understanding of the signs/symptoms and immediate care of hyper/hypoglycemia, proper foot care and importance of medication, aerobic/resistive exercise and nutrition plan for blood glucose control.;Long Term: Attainment of HbA1C < 7%.    Hypertension Yes    Intervention Provide education on lifestyle modifcations including regular physical activity/exercise, weight management, moderate sodium restriction and increased consumption of fresh fruit, vegetables, and low fat dairy, alcohol moderation, and smoking cessation.;Monitor prescription use compliance.    Expected Outcomes Short Term: Continued assessment and intervention until BP is < 140/1mm HG in hypertensive participants. < 130/58mm HG in hypertensive participants with diabetes, heart failure or chronic kidney disease.;Long Term: Maintenance of blood pressure at goal levels.             Education:Diabetes - Individual verbal and written instruction to review signs/symptoms of diabetes,  desired ranges of glucose level fasting, after meals and with exercise. Acknowledge that pre and post exercise glucose checks will be done for 3 sessions at entry of program. Flowsheet Row Pulmonary Rehab from 05/11/2023 in [facility] Cardiac and Pulmonary Rehab  Date 03/27/23  Educator MB  Instruction Review Code 1- Verbalizes Understanding       Know Your Numbers and Heart Failure: - Group verbal and visual instruction to discuss disease risk factors for cardiac and pulmonary disease and treatment options.  Reviews associated critical values for Overweight/Obesity, Hypertension, Cholesterol, and Diabetes.  Discusses basics of heart failure: signs/symptoms and treatments.  Introduces Heart Failure Zone chart for action plan for heart failure.  Written material given at graduation.   Core Components/Risk Factors/Patient Goals Review:   Goals and Risk Factor Review     Row Name  04/20/23 0926 05/09/23 0930           Core Components/Risk Factors/Patient Goals Review   Personal Goals Review Improve shortness of breath with ADL's Weight Management/Obesity      Review Spoke to patient about their shortness of breath and what they can do to improve. Patient has been informed of breathing techniques when starting the program. Patient is informed to tell staff if they have had any med changes and that certain meds they are taking or not taking can be causing shortness of breath. Ed states that he is more interested in mainting his weight rather than losing weight. He said that he took ozempic over the summer when he weighed 330lb, but knows he feels comfotable at his current weight of around 260-270lbs.      Expected Outcomes Short: Attend [facility] regularly to improve shortness of breath with ADL's. Long: maintain independence with ADL's Short: Continue to manage diet and exercise in order to maintain weight. Long: Attend rehab in order to manage weight.               Core Components/Risk  Factors/Patient Goals at Discharge (Final Review):   Goals and Risk Factor Review - 05/09/23 0930       Core Components/Risk Factors/Patient Goals Review   Personal Goals Review Weight Management/Obesity    Review Ed states that he is more interested in mainting his weight rather than losing weight. He said that he took ozempic over the summer when he weighed 330lb, but knows he feels comfotable at his current weight of around 260-270lbs.    Expected Outcomes Short: Continue to manage diet and exercise in order to maintain weight. Long: Attend rehab in order to manage weight.             ITP Comments:  ITP Comments     Row Name 03/16/23 1452 03/27/23 1639 03/29/23 0936 04/04/23 0937 04/26/23 1243   ITP Comments Initial phone call completed. Diagnosis can be found in [facility] 11/14. EP Orientation scheduled for Monday 12/16 at 2:30. Completed and gym orientation. Initial ITP created and sent for review to Dr. Jinny Sanders, Medical Director. 30 Day review completed. Medical Director ITP review done, changes made as directed, and signed approval by Medical Director.    new to program First full day of exercise!  Patient was oriented to gym and equipment including functions, settings, policies, and procedures.  Patient's individual exercise prescription and treatment plan were reviewed.  All starting workloads were established based on the results of the 6 minute walk test done at initial orientation visit.  The plan for exercise progression was also introduced and progression will be customized based on patient's performance and goals. 30 Day review completed. Medical Director ITP review done, changes made as directed, and signed approval by Medical Director.   new to program    Row Name 05/24/23 0723 06/21/23 0747         ITP Comments 30 Day review completed. Medical Director ITP review done, changes made as directed, and signed approval by Medical Director. 30 Day review completed. Medical  Director ITP review done, changes made as directed, and signed approval by Medical Director.               Comments:

## 2023-06-22 ENCOUNTER — Encounter: Payer: Medicare Other | Admitting: *Deleted

## 2023-06-22 DIAGNOSIS — R0609 Other forms of dyspnea: Secondary | ICD-10-CM

## 2023-06-22 NOTE — Progress Notes (Signed)
 Daily Session Note  Patient Details  Name: AKIA DESROCHES MRN: [id number] Date of Birth: 10/22/1940 Referring Provider:   Flowsheet Row Pulmonary Rehab from 03/27/2023 in [facility] Cardiac and Pulmonary Rehab  Referring Provider Vida Rigger, MD       Encounter Date: 06/22/2023  Check In:  Session Check In - 06/22/23 0952       Check-In   Supervising physician immediately available to respond to emergencies See telemetry face sheet for immediately available ER MD    Location [facility]-Cardiac & Pulmonary Rehab    Staff Present Cora Collum, RN, BSN, CCRP;Margaret Best, MS, Exercise Physiologist;Joseph Reino Kent RCP,RRT,BSRT    Virtual Visit No    Medication changes reported     No    Fall or balance concerns reported    No    Warm-up and Cool-down Performed on first and last piece of equipment    Resistance Training Performed Yes    VAD Patient? No    PAD/SET Patient? No      Pain Assessment   Currently in Pain? No/denies                Social History   Tobacco Use  Smoking Status Former   Current packs/day: 0.00   Average packs/day: 2.0 packs/day for 40.0 years (80.0 ttl pk-yrs)   Types: Cigarettes   Start date: 10/09/1961   Quit date: 10/09/2001   Years since quitting: 21.7  Smokeless Tobacco Never    Goals Met:  Proper associated with RPD/PD & O2 Sat Independence with exercise equipment Exercise tolerated well No report of concerns or symptoms today  Goals Unmet:  Not Applicable  Comments: Pt able to follow exercise prescription today without complaint.  Will continue to monitor for progression.    Dr. Bethann Punches is Medical Director for [facility] Cardiac Rehabilitation.  Dr. Vida Rigger is Medical Director for [facility] Pulmonary Rehabilitation.

## 2023-06-27 ENCOUNTER — Encounter: Payer: Medicare Other | Admitting: *Deleted

## 2023-06-27 DIAGNOSIS — R0609 Other forms of dyspnea: Secondary | ICD-10-CM | POA: Diagnosis not present

## 2023-06-27 NOTE — Progress Notes (Signed)
 Daily Session Note  Patient Details  Name: ALLANTE WHITMIRE MRN: [id number] Date of Birth: September 25, 1940 Referring Provider:   Flowsheet Row Pulmonary Rehab from 03/27/2023 in [facility] Cardiac and Pulmonary Rehab  Referring Provider Vida Rigger, MD       Encounter Date: 06/27/2023  Check In:  Session Check In - 06/27/23 0935       Check-In   Supervising physician immediately available to respond to emergencies See telemetry face sheet for immediately available ER MD    Location [facility]-Cardiac & Pulmonary Rehab    Staff Present Cora Collum, RN, BSN, CCRP;Margaret Best, MS, Exercise Physiologist;Noah Tickle, BS, Exercise Physiologist;Jason Wallace Cullens RDN,LDN    Virtual Visit No    Medication changes reported     No    Fall or balance concerns reported    No    Warm-up and Cool-down Performed on first and last piece of equipment    Resistance Training Performed Yes    VAD Patient? No    PAD/SET Patient? No      Pain Assessment   Currently in Pain? No/denies                Social History   Tobacco Use  Smoking Status Former   Current packs/day: 0.00   Average packs/day: 2.0 packs/day for 40.0 years (80.0 ttl pk-yrs)   Types: Cigarettes   Start date: 10/09/1961   Quit date: 10/09/2001   Years since quitting: 21.7  Smokeless Tobacco Never    Goals Met:  Proper associated with RPD/PD & O2 Sat Independence with exercise equipment Exercise tolerated well No report of concerns or symptoms today  Goals Unmet:  Not Applicable  Comments: Pt able to follow exercise prescription today without complaint.  Will continue to monitor for progression.    Dr. Bethann Punches is Medical Director for [facility] Cardiac Rehabilitation.  Dr. Vida Rigger is Medical Director for [facility] Pulmonary Rehabilitation.

## 2023-06-29 ENCOUNTER — Encounter: Payer: Medicare Other | Admitting: *Deleted

## 2023-06-29 DIAGNOSIS — R0609 Other forms of dyspnea: Secondary | ICD-10-CM

## 2023-06-29 NOTE — Progress Notes (Signed)
 Daily Session Note  Patient Details  Name: Walter Knox MRN: [id number] Date of Birth: 1941/03/31 Referring Provider:   Flowsheet Row Pulmonary Rehab from 03/27/2023 in [facility] Cardiac and Pulmonary Rehab  Referring Provider Vida Rigger, MD       Encounter Date: 06/29/2023  Check In:  Session Check In - 06/29/23 0930       Check-In   Supervising physician immediately available to respond to emergencies See telemetry face sheet for immediately available ER MD    Location [facility]-Cardiac & Pulmonary Rehab    Staff Present Balinda Quails RDN,LDN;Margaret Best, MS, Exercise Physiologist;Magdelyn Roebuck, RN, BSN, CCRP;Joseph Hood RCP,RRT,BSRT    Virtual Visit No    Medication changes reported     No    Fall or balance concerns reported    No    Warm-up and Cool-down Performed on first and last piece of equipment    Resistance Training Performed Yes    VAD Patient? No    PAD/SET Patient? No      Pain Assessment   Currently in Pain? No/denies                Social History   Tobacco Use  Smoking Status Former   Current packs/day: 0.00   Average packs/day: 2.0 packs/day for 40.0 years (80.0 ttl pk-yrs)   Types: Cigarettes   Start date: 10/09/1961   Quit date: 10/09/2001   Years since quitting: 21.7  Smokeless Tobacco Never    Goals Met:  Proper associated with RPD/PD & O2 Sat Independence with exercise equipment Exercise tolerated well No report of concerns or symptoms today  Goals Unmet:  Not Applicable  Comments: Pt able to follow exercise prescription today without complaint.  Will continue to monitor for progression.    Dr. Bethann Punches is Medical Director for [facility] Cardiac Rehabilitation.  Dr. Vida Rigger is Medical Director for [facility] Pulmonary Rehabilitation.

## 2023-07-04 ENCOUNTER — Encounter: Payer: Medicare Other | Admitting: *Deleted

## 2023-07-04 DIAGNOSIS — R0609 Other forms of dyspnea: Secondary | ICD-10-CM

## 2023-07-04 NOTE — Progress Notes (Signed)
 Daily Session Note  Patient Details  Name: Walter Knox MRN: [id number] Date of Birth: 02-11-1941 Referring Provider:   Flowsheet Row Pulmonary Rehab from 03/27/2023 in [facility] Cardiac and Pulmonary Rehab  Referring Provider Vida Rigger, MD       Encounter Date: 07/04/2023  Check In:  Session Check In - 07/04/23 7846       Check-In   Supervising physician immediately available to respond to emergencies See telemetry face sheet for immediately available ER MD    Location [facility]-Cardiac & Pulmonary Rehab    Staff Present Rory Percy, MS, Exercise Physiologist;Maxon Conetta BS, Exercise Physiologist;Noah Tickle, BS, Exercise Physiologist;Arne Schlender, RN, BSN, CCRP    Virtual Visit No    Medication changes reported     No    Fall or balance concerns reported    No    Warm-up and Cool-down Performed on first and last piece of equipment    Resistance Training Performed Yes    VAD Patient? No    PAD/SET Patient? No      Pain Assessment   Currently in Pain? No/denies                Social History   Tobacco Use  Smoking Status Former   Current packs/day: 0.00   Average packs/day: 2.0 packs/day for 40.0 years (80.0 ttl pk-yrs)   Types: Cigarettes   Start date: 10/09/1961   Quit date: 10/09/2001   Years since quitting: 21.7  Smokeless Tobacco Never    Goals Met:  Proper associated with RPD/PD & O2 Sat Independence with exercise equipment Exercise tolerated well No report of concerns or symptoms today  Goals Unmet:  Not Applicable  Comments: Pt able to follow exercise prescription today without complaint.  Will continue to monitor for progression.    Dr. Bethann Punches is Medical Director for [facility] Cardiac Rehabilitation.  Dr. Vida Rigger is Medical Director for [facility] Pulmonary Rehabilitation.

## 2023-07-06 ENCOUNTER — Encounter: Payer: Medicare Other | Admitting: *Deleted

## 2023-07-06 DIAGNOSIS — R0609 Other forms of dyspnea: Secondary | ICD-10-CM

## 2023-07-06 NOTE — Progress Notes (Signed)
 Daily Session Note  Patient Details  Name: Walter Knox MRN: [id number] Date of Birth: Oct 18, 1940 Referring Provider:   Flowsheet Row Pulmonary Rehab from 03/27/2023 in [facility] Cardiac and Pulmonary Rehab  Referring Provider Vida Rigger, MD       Encounter Date: 07/06/2023  Check In:  Session Check In - 07/06/23 1020       Check-In   Supervising physician immediately available to respond to emergencies See telemetry face sheet for immediately available ER MD    Location [facility]-Cardiac & Pulmonary Rehab    Staff Present Cora Collum, RN, BSN, CCRP;Margaret Best, MS, Exercise Physiologist;Maxon Conetta BS, Exercise Physiologist;Joseph Reino Kent RCP,RRT,BSRT    Virtual Visit No    Medication changes reported     No    Fall or balance concerns reported    No    Warm-up and Cool-down Performed on first and last piece of equipment    Resistance Training Performed Yes    VAD Patient? No    PAD/SET Patient? No      Pain Assessment   Currently in Pain? No/denies                Social History   Tobacco Use  Smoking Status Former   Current packs/day: 0.00   Average packs/day: 2.0 packs/day for 40.0 years (80.0 ttl pk-yrs)   Types: Cigarettes   Start date: 10/09/1961   Quit date: 10/09/2001   Years since quitting: 21.7  Smokeless Tobacco Never    Goals Met:  Proper associated with RPD/PD & O2 Sat Independence with exercise equipment Exercise tolerated well No report of concerns or symptoms today  Goals Unmet:  Not Applicable  Comments: Pt able to follow exercise prescription today without complaint.  Will continue to monitor for progression.    Dr. Bethann Punches is Medical Director for [facility] Cardiac Rehabilitation.  Dr. Vida Rigger is Medical Director for [facility] Pulmonary Rehabilitation.

## 2023-07-11 ENCOUNTER — Ambulatory Visit

## 2023-07-11 ENCOUNTER — Other Ambulatory Visit: Payer: Self-pay

## 2023-07-11 ENCOUNTER — Ambulatory Visit
Admission: RE | Admit: 2023-07-11 | Discharge: 2023-07-11 | Disposition: A | Discharge status: Home / Self Care | Attending: Cardiology | Admitting: Cardiology

## 2023-07-11 ENCOUNTER — Encounter
Admission: RE | Disposition: A | Discharge status: Home / Self Care | Payer: Self-pay | Source: Home / Self Care | Attending: Cardiology

## 2023-07-11 ENCOUNTER — Encounter: Payer: Self-pay | Admitting: Cardiology

## 2023-07-11 DIAGNOSIS — Z45010 Encounter for checking and testing of cardiac pacemaker pulse generator [battery]: Secondary | ICD-10-CM | POA: Diagnosis present

## 2023-07-11 LAB — GLUCOSE, CAPILLARY: Glucose-Capillary: 130 mg/dL — ABNORMAL HIGH (ref 70–99)

## 2023-07-11 SURGERY — PPM GENERATOR CHANGEOUT
Anesthesia: Moderate Sedation

## 2023-07-11 MED ORDER — IOHEXOL 300 MG/ML  SOLN
INTRAMUSCULAR | Status: DC | PRN
Start: 2023-07-11 — End: 2023-07-11
  Administered 2023-07-11: 20 mL

## 2023-07-11 MED ORDER — FENTANYL CITRATE (PF) 100 MCG/2ML IJ SOLN
INTRAMUSCULAR | Status: AC
Start: 2023-07-11 — End: ?
  Filled 2023-07-11: qty 2

## 2023-07-11 MED ORDER — FENTANYL CITRATE (PF) 100 MCG/2ML IJ SOLN
INTRAMUSCULAR | Status: DC | PRN
  Administered 2023-07-11: 25 ug via INTRAVENOUS

## 2023-07-11 MED ORDER — MIDAZOLAM HCL 2 MG/2ML IJ SOLN
INTRAMUSCULAR | Status: AC
  Filled 2023-07-11: qty 2

## 2023-07-11 MED ORDER — MIDAZOLAM HCL 2 MG/2ML IJ SOLN
INTRAMUSCULAR | Status: DC | PRN
  Administered 2023-07-11: 2 mg via INTRAVENOUS

## 2023-07-11 MED ORDER — HEPARIN (PORCINE) IN NACL 1000-0.9 UT/500ML-% IV SOLN
INTRAVENOUS | Status: DC | PRN
  Administered 2023-07-11: 500 mL

## 2023-07-11 MED ORDER — CEFAZOLIN SODIUM-DEXTROSE 1-4 GM/50ML-% IV SOLN: INTRAVENOUS | Status: DC | PRN

## 2023-07-11 MED ORDER — ONDANSETRON HCL 4 MG/2ML IJ SOLN: 4.0000 mg | Freq: Four times a day (QID) | INTRAMUSCULAR | Status: DC | PRN

## 2023-07-11 MED ORDER — CEPHALEXIN 500 MG PO CAPS: 500.0000 mg | ORAL_CAPSULE | Freq: Two times a day (BID) | ORAL | 0 refills | Status: AC

## 2023-07-11 MED ORDER — LIDOCAINE HCL (PF) 1 % IJ SOLN
INTRAMUSCULAR | Status: DC | PRN
  Administered 2023-07-11: 20 mL

## 2023-07-11 MED ORDER — LIDOCAINE HCL 1 % IJ SOLN
INTRAMUSCULAR | Status: AC
  Filled 2023-07-11: qty 60

## 2023-07-11 MED ORDER — SODIUM CHLORIDE 0.9 % IV SOLN
80.0000 mg | INTRAVENOUS | Status: AC
  Administered 2023-07-11: 80 mg
  Filled 2023-07-11: qty 2

## 2023-07-11 MED ORDER — ACETAMINOPHEN 325 MG PO TABS: 325.0000 mg | ORAL_TABLET | ORAL | Status: DC | PRN

## 2023-07-11 MED ORDER — SODIUM CHLORIDE 0.9 % IV SOLN: INTRAVENOUS | Status: DC

## 2023-07-11 MED ORDER — CEFAZOLIN SODIUM-DEXTROSE 3-4 GM/150ML-% IV SOLN
3.0000 g | INTRAVENOUS | Status: AC
  Administered 2023-07-11: 3 g via INTRAVENOUS
  Filled 2023-07-11: qty 150

## 2023-07-11 MED ORDER — SODIUM CHLORIDE 0.9 % IV BOLUS
INTRAVENOUS | Status: DC | PRN
  Administered 2023-07-11: 300 mL via INTRAVENOUS

## 2023-07-11 SURGICAL SUPPLY — 16 items
CABLE SURG 12 DISP A/V CHANNEL (MISCELLANEOUS) IMPLANT
COVER EZ STRL 42X30 (DRAPES) IMPLANT
DEVICE DSSCT PLSMBLD 3.0S LGHT (MISCELLANEOUS) IMPLANT
DRAPE INCISE 23X17 STRL (DRAPES) IMPLANT
DRAPE INCISE IOBAN 23X17 STRL (DRAPES) ×1 IMPLANT
IPG PACE AZUR XT DR MRI W1DR01 (Pacemaker) IMPLANT
KIT SYRINGE INJ CVI SPIKEX1 (MISCELLANEOUS) IMPLANT
PACE AZURE XT DR MRI W1DR01 (Pacemaker) ×1 IMPLANT
PAD ELECT DEFIB RADIOL ZOLL (MISCELLANEOUS) IMPLANT
PLASMABLADE 3.0S W/LIGHT (MISCELLANEOUS) ×1 IMPLANT
POUCH AIGIS-R ANTIBACT PPM (Mesh General) ×1 IMPLANT
POUCH AIGIS-R ANTIBACT PPM MED (Mesh General) IMPLANT
SHEATH 7FR PRELUDE SNAP 13 (SHEATH) IMPLANT
SUT VIC AB 2-0 CT1 TAPERPNT 27 (SUTURE) IMPLANT
SUT VIC AB 4-0 PS2 18 (SUTURE) IMPLANT
TRAY PACEMAKER INSERTION (PACKS) ×1 IMPLANT

## 2023-07-11 NOTE — Discharge Instructions (Addendum)
 Resume Eliquis 07/12/2023.  Patient may shower 07/13/2023.  Patient may remove Tegaderm after shower, leave Steri-Strips on.

## 2023-07-12 ENCOUNTER — Encounter: Payer: Self-pay | Admitting: Cardiology

## 2023-07-19 ENCOUNTER — Encounter: Payer: Self-pay | Admitting: *Deleted

## 2023-07-19 DIAGNOSIS — R0609 Other forms of dyspnea: Secondary | ICD-10-CM

## 2023-07-19 NOTE — Progress Notes (Signed)
 Pulmonary Individual Treatment Plan  Patient Details  Name: Walter Knox MRN: [id number] Date of Birth: 1940/11/08 Referring Provider:   Flowsheet Row Pulmonary Rehab from 03/27/2023 in [facility] Cardiac and Pulmonary Rehab  Referring Provider Vida Rigger, MD       Initial Encounter Date:  Flowsheet Row Pulmonary Rehab from 03/27/2023 in [facility] Cardiac and Pulmonary Rehab  Date 03/27/23       Visit Diagnosis: Dyspnea on exertion  Patient's Home Medications on Admission:  Current Outpatient Medications:    acetaminophen (TYLENOL) 500 MG tablet, Take 1,000 mg by mouth 2 (two) times daily., Disp: , Rfl:    amiodarone (PACERONE) 200 MG tablet, Take 200 mg by mouth in the morning., Disp: , Rfl:    cephALEXin (KEFLEX) 500 MG capsule, Take 1 capsule (500 mg total) by mouth 2 (two) times daily., Disp: 20 capsule, Rfl: 0   docusate sodium (COLACE) 100 MG capsule, Take 100 mg by mouth daily., Disp: , Rfl:    dutasteride (AVODART) 0.5 MG capsule, Take 0.5 mg by mouth daily., Disp: , Rfl:    ELIQUIS 5 MG TABS tablet, Take 5 mg by mouth 2 (two) times daily., Disp: , Rfl:    ezetimibe (ZETIA) 10 MG tablet, Take 10 mg by mouth at bedtime., Disp: , Rfl:    FARXIGA 10 MG TABS tablet, Take 10 mg by mouth daily., Disp: , Rfl:    Loratadine 10 MG CAPS, Take 10 mg by mouth daily., Disp: , Rfl:    losartan-hydrochlorothiazide (HYZAAR) 50-12.5 MG tablet, Take 1 tablet by mouth daily., Disp: , Rfl:    lovastatin (ALTOPREV) 20 MG 24 hr tablet, Take 20 mg by mouth at bedtime., Disp: , Rfl:    metFORMIN (GLUCOPHAGE-XR) 500 MG 24 hr tablet, Take 500 mg by mouth daily with supper., Disp: , Rfl:    metoprolol tartrate (LOPRESSOR) 100 MG tablet, Take 100 mg by mouth 2 (two) times daily., Disp: , Rfl:    Multiple Vitamin (MULTIVITAMIN) tablet, Take 1 tablet by mouth daily., Disp: , Rfl:    omeprazole (PRILOSEC) 20 MG capsule, Take 20 mg by mouth at bedtime. , Disp: , Rfl:    PHENYLEPHRINE-GUAIFENESIN PO, Take  400 mg by mouth daily as needed (Congestion). Mucinex, Disp: , Rfl:    polycarbophil (FIBERCON) 625 MG tablet, Take 1,875 mg by mouth 2 (two) times daily., Disp: , Rfl:    torsemide (DEMADEX) 20 MG tablet, Take 20 mg by mouth daily., Disp: , Rfl:   Past Medical History: Past Medical History:  Diagnosis Date   Anginal pain (HCC)    BPH (benign prostatic hyperplasia)    CAD (coronary artery disease)    Cancer (HCC)    Chronic back pain    Dyspnea    Dysrhythmia    Environmental and seasonal allergies    Hyperlipidemia    Hypertension    Lower extremity edema    Morbid obesity (HCC)    Pre-diabetes    PVD (peripheral vascular disease) (HCC)    SSS (sick sinus syndrome) (HCC)     Tobacco Use: Social History   Tobacco Use  Smoking Status Former   Current packs/day: 0.00   Average packs/day: 2.0 packs/day for 40.0 years (80.0 ttl pk-yrs)   Types: Cigarettes   Start date: 10/09/1961   Quit date: 10/09/2001   Years since quitting: 21.7  Smokeless Tobacco Never    Labs: Review Flowsheet        No data to display  Pulmonary Assessment Scores:  Pulmonary Assessment Scores     Row Name 03/27/23 1647 04/04/23 0927       ADL UCSD   ADL Phase -- Entry    SOB Score total -- 32    Rest -- 0    Walk -- 0  Starts at 0 and becomes 4    Stairs -- 4    Bath -- 0    Dress -- 0    Shop -- 1      CAT Score   CAT Score 10 --      mMRC Score   mMRC Score 3 --             UCSD: Self-administered rating of dyspnea associated with activities of daily living (ADLs) 6-point scale (0 = "not at all" to 5 = "maximal or unable to do because of breathlessness")  Scoring Scores range from 0 to 120.  Minimally important difference is 5 units  CAT: CAT can identify the health impairment of COPD patients and is better correlated with disease progression.  CAT has a scoring range of zero to 40. The CAT score is classified into four groups of low (less than 10), medium  (10 - 20), high (21-30) and very high (31-40) based on the impact level of disease on health status. A CAT score over 10 suggests significant symptoms.  A worsening CAT score could be explained by an exacerbation, poor medication adherence, poor inhaler technique, or progression of COPD or comorbid conditions.  CAT MCID is 2 points  mMRC: mMRC (Modified Medical Research Council) Dyspnea Scale is used to assess the degree of baseline functional disability in patients of respiratory disease due to dyspnea. No minimal important difference is established. A decrease in score of 1 point or greater is considered a positive change.   Pulmonary Function Assessment:   Exercise Target Goals: Exercise Program Goal: Individual exercise prescription set using results from initial 6 min walk test and THRR while considering  patient's activity barriers and safety.   Exercise Prescription Goal: Initial exercise prescription builds to 30-45 minutes a day of aerobic activity, 2-3 days per week.  Home exercise guidelines will be given to patient during program as part of exercise prescription that the participant will acknowledge.  Education: Aerobic Exercise: - Group verbal and visual presentation on the components of exercise prescription. Introduces F.I.T.T principle from ACSM for exercise prescriptions.  Reviews F.I.T.T. principles of aerobic exercise including progression. Written material given at graduation.   Education: Resistance Exercise: - Group verbal and visual presentation on the components of exercise prescription. Introduces F.I.T.T principle from ACSM for exercise prescriptions  Reviews F.I.T.T. principles of resistance exercise including progression. Written material given at graduation. Flowsheet Row Pulmonary Rehab from 05/11/2023 in [facility] Cardiac and Pulmonary Rehab  Date 04/13/23  Educator MB  Instruction Review Code 1- [employer] Understanding        Education: Exercise & Equipment  Safety: - Individual verbal instruction and demonstration of equipment use and safety with use of the equipment. Flowsheet Row Pulmonary Rehab from 05/11/2023 in [facility] Cardiac and Pulmonary Rehab  Date 03/27/23  Educator MB  Instruction Review Code 1- Verbalizes Understanding       Education: Exercise Physiology & General Exercise Guidelines: - Group verbal and written instruction with models to review the exercise physiology of the cardiovascular system and associated critical values. Provides general exercise guidelines with specific guidelines to those with heart or lung disease.    Education: Flexibility, Balance, Mind/Body  Relaxation: - Group verbal and visual presentation with interactive activity on the components of exercise prescription. Introduces F.I.T.T principle from ACSM for exercise prescriptions. Reviews F.I.T.T. principles of flexibility and balance exercise training including progression. Also discusses the mind body connection.  Reviews various relaxation techniques to help reduce and manage stress (i.e. Deep breathing, progressive muscle relaxation, and visualization). Balance handout provided to take home. Written material given at graduation. Flowsheet Row Pulmonary Rehab from 05/11/2023 in [facility] Cardiac and Pulmonary Rehab  Date 04/13/23  Educator MB  Instruction Review Code 1- Verbalizes Understanding       Activity Barriers & Risk Stratification:  Activity Barriers & Cardiac Risk Stratification - 03/27/23 1641       Activity Barriers & Cardiac Risk Stratification   Activity Barriers Left Knee Replacement;Right Knee Replacement;Deconditioning             6 Minute Walk:  6 Minute Walk     Row Name 03/27/23 1640         6 Minute Walk   Phase Initial     Distance 1185 feet     Walk Time 6 minutes     # of Rest Breaks 0     MPH 2.24     METS 1.45     RPE 5.07     Perceived Dyspnea  3     VO2 Peak 5.07     Symptoms Yes (comment)     Comments SOB      Resting HR 60 bpm     Resting BP 122/80     Resting Oxygen Saturation  93 %     Exercise Oxygen Saturation  during 6 min walk 94 %     Max Ex. HR 101 bpm     Max Ex. BP 154/76     2 Minute Post BP 126/76       Interval HR   1 Minute HR 96     2 Minute HR 101     3 Minute HR 80     4 Minute HR 84     5 Minute HR 84     6 Minute HR 90     2 Minute Post HR 60     Interval Heart Rate? Yes       Interval Oxygen   Interval Oxygen? Yes     Baseline Oxygen Saturation % 93 %     1 Minute Oxygen Saturation % 94 %     1 Minute Liters of Oxygen 0 L     2 Minute Oxygen Saturation % 95 %     2 Minute Liters of Oxygen 0 L     3 Minute Oxygen Saturation % 95 %     3 Minute Liters of Oxygen 0 L     4 Minute Oxygen Saturation % 96 %     4 Minute Liters of Oxygen 0 L     5 Minute Oxygen Saturation % 96 %     5 Minute Liters of Oxygen 0 L     6 Minute Oxygen Saturation % 96 %     6 Minute Liters of Oxygen 0 L     2 Minute Post Oxygen Saturation % 97 %     2 Minute Post Liters of Oxygen 0 L             Oxygen Initial Assessment:  Oxygen Initial Assessment - 03/16/23 1431       Home Oxygen   Home Oxygen  Device None    Sleep Oxygen Prescription None    Home Exercise Oxygen Prescription None    Home Resting Oxygen Prescription None    Compliance with Home Oxygen Use Yes      Intervention   Short Term Goals To learn and understand importance of monitoring SPO2 with pulse oximeter and demonstrate accurate use of the pulse oximeter.;To learn and understand importance of maintaining oxygen saturations>88%;To learn and demonstrate proper pursed lip breathing techniques or other breathing techniques. ;To learn and demonstrate proper use of respiratory medications    Long  Term Goals Verbalizes importance of monitoring SPO2 with pulse oximeter and return demonstration;Maintenance of O2 saturations>88%;Exhibits proper breathing techniques, such as pursed lip breathing or other method taught  during program session;Compliance with respiratory medication;Demonstrates proper use of MDI's             Oxygen Re-Evaluation:  Oxygen Re-Evaluation     Row Name 04/04/23 0934 05/09/23 0933 07/06/23 0953         Program Oxygen Prescription   Program Oxygen Prescription None None None       Home Oxygen   Home Oxygen Device None None None     Sleep Oxygen Prescription None None None     Home Exercise Oxygen Prescription None None None     Home Resting Oxygen Prescription None None None     Compliance with Home Oxygen Use Yes Yes Yes       Goals/Expected Outcomes   Short Term Goals To learn and demonstrate proper pursed lip breathing techniques or other breathing techniques.  To learn and demonstrate proper pursed lip breathing techniques or other breathing techniques.  To learn and demonstrate proper pursed lip breathing techniques or other breathing techniques.      Long  Term Goals Exhibits proper breathing techniques, such as pursed lip breathing or other method taught during program session Exhibits proper breathing techniques, such as pursed lip breathing or other method taught during program session Exhibits proper breathing techniques, such as pursed lip breathing or other method taught during program session     Comments Reviewed PLB technique with pt.  Talked about how it works and it's importance in maintaining their exercise saturations. Reviewed PLB technique with pt.  Talked about how it works and it's importance in maintaining their exercise saturations. Reviewed PLB with Ed and he verbalized understanding     Goals/Expected Outcomes Short: Become more profiecient at using PLB. Long: Become independent at using PLB. Short: Become more profiecient at using PLB. Long: Become independent at using PLB. STG: Become more profiecient at using PLB. LTG: Become independent at using PLB.              Oxygen Discharge (Final Oxygen Re-Evaluation):  Oxygen Re-Evaluation -  07/06/23 0953       Program Oxygen Prescription   Program Oxygen Prescription None      Home Oxygen   Home Oxygen Device None    Sleep Oxygen Prescription None    Home Exercise Oxygen Prescription None    Home Resting Oxygen Prescription None    Compliance with Home Oxygen Use Yes      Goals/Expected Outcomes   Short Term Goals To learn and demonstrate proper pursed lip breathing techniques or other breathing techniques.     Long  Term Goals Exhibits proper breathing techniques, such as pursed lip breathing or other method taught during program session    Comments Reviewed PLB with Ed and he verbalized understanding  Goals/Expected Outcomes STG: Become more profiecient at using PLB. LTG: Become independent at using PLB.             Initial Exercise Prescription:  Initial Exercise Prescription - 03/27/23 1600       Date of Initial Exercise RX and Referring Provider   Date 03/27/23    Referring Provider Vida Rigger, MD      Oxygen   Maintain Oxygen Saturation 88% or higher      Treadmill   MPH 2    Grade 0    Minutes 15    METs 2.53      Recumbant Bike   Level 1    RPM 50    Watts 15    Minutes 15    METs 1.45      NuStep   Level 1    SPM 80    Minutes 15    METs 1.45      Track   Laps 28    Minutes 15    METs 2.52      Prescription Details   Frequency (times per week) 2    Duration Progress to 30 minutes of continuous aerobic without signs/symptoms of physical distress      Intensity   THRR 40-80% of Max Heartrate 90-121    Ratings of Perceived Exertion 11-13    Perceived Dyspnea 0-4      Progression   Progression Continue to progress workloads to maintain intensity without signs/symptoms of physical distress.      Resistance Training   Training Prescription Yes    Weight 7 lb    Reps 10-15             Perform Capillary Blood Glucose checks as needed.  Exercise Prescription Changes:   Exercise Prescription Changes     Row  Name 03/27/23 1600 04/19/23 1300 05/02/23 1000 05/03/23 1400 05/16/23 0700     Response to Exercise   Blood Pressure (Admit) 122/80 120/80 120/80 104/60 126/74   Blood Pressure (Exercise) 154/76 142/68 142/68 128/70 126/88   Blood Pressure (Exit) 126/76 106/62 106/62 102/64 98/58   Heart Rate (Admit) 60 bpm 60 bpm 60 bpm 79 bpm 83 bpm   Heart Rate (Exercise) 101 bpm 95 bpm 95 bpm 107 bpm 108 bpm   Heart Rate (Exit) 60 bpm 81 bpm 81 bpm 85 bpm 78 bpm   Oxygen Saturation (Admit) 93 % 93 % 93 % 96 % 95 %   Oxygen Saturation (Exercise) 94 % 93 % 93 % 95 % 93 %   Oxygen Saturation (Exit) 97 % 96 % 96 % 97 % 93 %   Rating of Perceived Exertion (Exercise) 15 15 15 14 14    Perceived Dyspnea (Exercise) 3 2 2 3  --   Symptoms SOB -- -- none none   Comments results -- -- -- --   Duration Progress to 30 minutes of  aerobic without signs/symptoms of physical distress Progress to 30 minutes of  aerobic without signs/symptoms of physical distress Progress to 30 minutes of  aerobic without signs/symptoms of physical distress Progress to 30 minutes of  aerobic without signs/symptoms of physical distress Progress to 30 minutes of  aerobic without signs/symptoms of physical distress   Intensity THRR New THRR unchanged THRR unchanged THRR unchanged THRR unchanged     Progression   Progression Continue to progress workloads to maintain intensity without signs/symptoms of physical distress. Continue to progress workloads to maintain intensity without signs/symptoms of physical distress. Continue  to progress workloads to maintain intensity without signs/symptoms of physical distress. Continue to progress workloads to maintain intensity without signs/symptoms of physical distress. Continue to progress workloads to maintain intensity without signs/symptoms of physical distress.   Average METs 1.45 3.5 3.5 3.66 3.98     Resistance Training   Training Prescription -- Yes Yes Yes Yes   Weight -- 7 lb 7 lb 7 lb 7 lb    Reps -- 10-15 10-15 10-15 10-15     Interval Training   Interval Training -- No No No No     Treadmill   MPH -- 2 2 2.2 2.8   Grade -- 0 0 0 0   Minutes -- 15 15 15 15    METs -- 2.53 2.53 2.69 3.14     NuStep   Level -- 4 4 5 5    Minutes -- 15 15 15 15    METs -- 5.6 5.6 5.2 5.8     Home Exercise Plan   Plans to continue exercise at -- -- [employer] (comment)  Looking at a few different gyms ([place], [employer], or [address]) to do aerobic and resistance exercise. Will also golf some. [employer] (comment)  Looking at a few different gyms ([place], [employer], or [place]) to do aerobic and resistance exercise. Will also golf some. [employer] (comment)  Looking at a few different gyms ([place], [employer], or [place]) to do aerobic and resistance exercise. Will also golf some.   Frequency -- -- Add 3 additional days to program exercise sessions. Add 3 additional days to program exercise sessions. Add 3 additional days to program exercise sessions.   Initial Home Exercises Provided -- -- 05/02/23 05/02/23 05/02/23     Oxygen   Maintain Oxygen Saturation -- 88% or higher 88% or higher 88% or higher 88% or higher    Row Name 06/01/23 1700 06/13/23 1500 06/26/23 1400 07/13/23 1600       Response to Exercise   Blood Pressure (Admit) 118/68 100/74 108/70 104/70    Blood Pressure (Exercise) 122/64 -- -- --    Blood Pressure (Exit) 126/70 104/58 110/68 104/62    Heart Rate (Admit) 71 bpm 60 bpm 75 bpm 60 bpm    Heart Rate (Exercise) 115 bpm 80 bpm 102 bpm 115 bpm    Heart Rate (Exit) 88 bpm -- 83 bpm 62 bpm    Oxygen Saturation (Admit) 97 % 96 % 94 % 98 %    Oxygen Saturation (Exercise) 93 % 93 % 92 % 92 %    Oxygen Saturation (Exit) 97 % 97 % 94 % 94 %    Rating of Perceived Exertion (Exercise) 16 16 16 15     Perceived Dyspnea (Exercise) 3 3 3 3     Symptoms none none none none    Duration Progress to 30 minutes of  aerobic without signs/symptoms of physical distress Continue with  30 min of aerobic exercise without signs/symptoms of physical distress. Continue with 30 min of aerobic exercise without signs/symptoms of physical distress. Continue with 30 min of aerobic exercise without signs/symptoms of physical distress.    Intensity THRR unchanged THRR unchanged THRR unchanged THRR unchanged      Progression   Progression Continue to progress workloads to maintain intensity without signs/symptoms of physical distress. Continue to progress workloads to maintain intensity without signs/symptoms of physical distress. Continue to progress workloads to maintain intensity without signs/symptoms of physical distress. Continue to progress workloads to maintain intensity without signs/symptoms of physical distress.  Average METs 3.9 3.22 3.34 3.96      Resistance Training   Training Prescription Yes Yes Yes Yes    Weight 7 lb 7 lb 7 lb 7 lb    Reps 10-15 10-15 10-15 10-15      Interval Training   Interval Training No No No No      Treadmill   MPH 2.8 2.8 -- --    Grade 0 0 -- --    Minutes 15 15 -- --    METs 3.14 3.14 -- --      NuStep   Level 6 -- -- --    Minutes 15 -- -- --    METs 6.5 -- -- --      REL-XR   Level -- -- 8 9    Minutes -- -- 15 15    METs -- -- 5.4 9.3      T5 Nustep   Level -- 5 5 5     SPM -- 80 -- --    Minutes -- 15 15 15     METs -- 3.3 3.6 3.7      Track   Laps -- -- 30 31    Minutes -- -- 15 15    METs -- -- 2.63 2.69      Home Exercise Plan   Plans to continue exercise at [employer] (comment)  Looking at a few different gyms ([place], [employer], or [place]) to do aerobic and resistance exercise. Will also golf some. -- [employer] (comment)  Looking at a few different gyms ([place], [employer], or [place]) to do aerobic and resistance exercise. Will also golf some. [employer] (comment)  Looking at a few different gyms ([place], [employer], or [place]) to do aerobic and resistance exercise. Will also golf some.    Frequency Add  3 additional days to program exercise sessions. -- Add 3 additional days to program exercise sessions. Add 3 additional days to program exercise sessions.    Initial Home Exercises Provided 05/02/23 -- 05/02/23 05/02/23      Oxygen   Maintain Oxygen Saturation 88% or higher -- 88% or higher 88% or higher             Exercise Comments:   Exercise Comments     Row Name 04/04/23 4540           Exercise Comments First full day of exercise!  Patient was oriented to gym and equipment including functions, settings, policies, and procedures.  Patient's individual exercise prescription and treatment plan were reviewed.  All starting workloads were established based on the results of the 6 minute walk test done at initial orientation visit.  The plan for exercise progression was also introduced and progression will be customized based on patient's performance and goals.                Exercise Goals and Review:   Exercise Goals     Row Name 03/27/23 1646             Exercise Goals   Increase Physical Activity Yes       Intervention Provide advice, education, support and counseling about physical activity/exercise needs.;Develop an individualized exercise prescription for aerobic and resistive training based on initial evaluation findings, risk stratification, comorbidities and participant's personal goals.       Expected Outcomes Short Term: Attend rehab on a regular basis to increase amount of physical activity.;Long Term: Add in home exercise to make exercise part of routine and to  increase amount of physical activity.;Long Term: Exercising regularly at least 3-5 days a week.       Increase Strength and Stamina Yes       Intervention Provide advice, education, support and counseling about physical activity/exercise needs.;Develop an individualized exercise prescription for aerobic and resistive training based on initial evaluation findings, risk stratification, comorbidities and  participant's personal goals.       Expected Outcomes Short Term: Increase workloads from initial exercise prescription for resistance, speed, and METs.;Short Term: Perform resistance training exercises routinely during rehab and add in resistance training at home;Long Term: Improve cardiorespiratory fitness, muscular endurance and strength as measured by increased METs and functional capacity ( )       Able to understand and use rate of perceived exertion (RPE) scale Yes       Intervention Provide education and explanation on how to use RPE scale       Expected Outcomes Short Term: Able to use RPE daily in rehab to express subjective intensity level;Long Term:  Able to use RPE to guide intensity level when exercising independently       Able to understand and use Dyspnea scale Yes       Intervention Provide education and explanation on how to use Dyspnea scale       Expected Outcomes Short Term: Able to use Dyspnea scale daily in rehab to express subjective sense of shortness of breath during exertion;Long Term: Able to use Dyspnea scale to guide intensity level when exercising independently       Knowledge and understanding of Target Heart Rate Range (THRR) Yes       Intervention Provide education and explanation of THRR including how the numbers were predicted and where they are located for reference       Expected Outcomes Short Term: Able to state/look up THRR;Short Term: Able to use daily as guideline for intensity in rehab;Long Term: Able to use THRR to govern intensity when exercising independently       Able to check pulse independently Yes       Intervention Provide education and demonstration on how to check pulse in carotid and radial arteries.;Review the importance of being able to check your own pulse for safety during independent exercise       Expected Outcomes Short Term: Able to explain why pulse checking is important during independent exercise;Long Term: Able to check pulse  independently and accurately       Understanding of Exercise Prescription Yes       Intervention Provide education, explanation, and written materials on patient's individual exercise prescription       Expected Outcomes Short Term: Able to explain program exercise prescription;Long Term: Able to explain home exercise prescription to exercise independently                Exercise Goals Re-Evaluation :  Exercise Goals Re-Evaluation     Row Name 04/04/23 0935 04/19/23 1357 05/02/23 1033 05/03/23 1427 05/16/23 0757     Exercise Goal Re-Evaluation   Exercise Goals Review Able to understand and use rate of perceived exertion (RPE) scale;Knowledge and understanding of Target Heart Rate Range (THRR);Understanding of Exercise Prescription;Able to understand and use Dyspnea scale Increase Physical Activity;Increase Strength and Stamina;Understanding of Exercise Prescription Able to check pulse independently;Able to understand and use Dyspnea scale;Increase Strength and Stamina;Understanding of Exercise Prescription;Knowledge and understanding of Target Heart Rate Range (THRR);Able to understand and use rate of perceived exertion (RPE) scale;Increase Physical Activity Increase Physical Activity;Increase Strength  and Stamina;Understanding of Exercise Prescription Increase Physical Activity;Increase Strength and Stamina;Understanding of Exercise Prescription   Comments Reviewed RPE and dyspnea scale, THR and program prescription with pt today.  Pt voiced understanding and was given a copy of goals to take home. Ed is off to a great start in the program. He has attended his first 4 sessions of the program, and has been able to increase to level 4 on the T4 nustep. He is also off to a great start on the treadmill at a workload of and no incline. We will continue to monitor his progress in the program. Reviewed home exercise with pt today from 9:37am to 10:00am.  Pt plans to go to a gym facility ([facility],  [employer], or [facility]) for aerobic and resistance exercise 3 additional days for exercise. He also will golf 2 days a week when the weather is appropriate. Reviewed THR, pulse, RPE, sign and symptoms, pulse oximetery and when to call 911 or MD.  Also discussed weather considerations and indoor options.  Pt voiced understanding. Ed continues to do well in rehab. He was recently able to increase his speed on the treadmill from to 2.60mph. He was also able to increase his level on the T4 nustep from level 4 to 5. We will continue to monitor his progress in the program. Ed is doing well in the program. He has recently been able to increase his speed on the treadmill from 2.2 to 2.67mph. He was also able to maintain an intensity of level 5 on the T4 nustep. We will continue to monitor his progress in the program.   Expected Outcomes Short: Use RPE daily to regulate intensity. Long: Follow program prescription in THR. Short: Continue to dollow exercise prescription and increase workloads when able. Long: Continue exercise to improve strength and stamina. Short: Add 3 additional days at home for exercise. Long: Continue to exercise independently for 5 days a week to improve strength and stamina. Short: Continue to increase treadmill workloads. Long: Continue exercise to increase strength and stamina. Short: Continue to increase treadmill workloads. Long: Continue exercise to increase strength and stamina.    Row Name 06/01/23 1746 06/13/23 1512 06/26/23 1422 07/06/23 0950 07/13/23 1613     Exercise Goal Re-Evaluation   Exercise Goals Review Increase Physical Activity;Increase Strength and Stamina;Understanding of Exercise Prescription Increase Physical Activity;Increase Strength and Stamina;Understanding of Exercise Prescription Increase Physical Activity;Increase Strength and Stamina;Understanding of Exercise Prescription Increase Physical Activity;Increase Strength and Stamina;Understanding of Exercise Prescription  Increase Physical Activity;Increase Strength and Stamina;Understanding of Exercise Prescription   Comments Ed is doing well in rehab. He was able to increase his level on the T4 nustep from 5 to 6. He was also able to maintain his speed of 2. on the treadmill. We will continue to monitor his progress in the program. Ed returned from his cruise/trip and completed 1 session in this review. He was able to maintain his speed of 2. on the treadmill and worked at level 5 on the T5 nustep as he gets back to the program. We will continue to monitor his progress in the program. Ed continues to do well in rehab. He started walking on the track instead of the treadmill as it is a higher intensity for him and was able to do 30 laps in this review. He also did level 8 on the XR and maintained level 5 on the T5 nustep. We will continue to monitor his progress in the program. Ed is doign well  at rehab. He is walking on the track today. says he is unusally tired, says he didnt sleep as well as he usually does. Commended him for still coming to rehab and reminded him to take breaks if needed. Encouraged him to increase workload as he is able. He reports he walks on days he doesnt attent rehab. Ed continues to make improvements in the program. He was able to increase his laps on the track from 30 to 31 in 15 minutes. He was also able to increase from level 8 to 9 on the XR. We will continue to monitor his progress in the program.   Expected Outcomes Short: Continue to increase treadmill workloads. Long: Continue exercise to increase strength and stamina. Short: Continue to progressively increase treadmill and T5 nustep workloads. Long: Continue exercise to improve strength and stamina. Short: Push for more laps on the track. Long: Continue exercise to improve strength and stamina STG: increase workload as able. LTG: Continue to exercise to improve strength and stamina Short: Continue to increase track laps. Long: Continue  exercise to improve strength and stamina.            Discharge Exercise Prescription (Final Exercise Prescription Changes):  Exercise Prescription Changes - 07/13/23 1600       Response to Exercise   Blood Pressure (Admit) 104/70    Blood Pressure (Exit) 104/62    Heart Rate (Admit) 60 bpm    Heart Rate (Exercise) 115 bpm    Heart Rate (Exit) 62 bpm    Oxygen Saturation (Admit) 98 %    Oxygen Saturation (Exercise) 92 %    Oxygen Saturation (Exit) 94 %    Rating of Perceived Exertion (Exercise) 15    Perceived Dyspnea (Exercise) 3    Symptoms none    Duration Continue with 30 min of aerobic exercise without signs/symptoms of physical distress.    Intensity THRR unchanged      Progression   Progression Continue to progress workloads to maintain intensity without signs/symptoms of physical distress.    Average METs 3.96      Resistance Training   Training Prescription Yes    Weight 7 lb    Reps 10-15      Interval Training   Interval Training No      REL-XR   Level 9    Minutes 15    METs 9.3      T5 Nustep   Level 5    Minutes 15    METs 3.7      Track   Laps 31    Minutes 15    METs 2.69      Home Exercise Plan   Plans to continue exercise at [employer] (comment)   Looking at a few different gyms ([facility], [employer], or [facility]) to do aerobic and resistance exercise. Will also golf some.   Frequency Add 3 additional days to program exercise sessions.    Initial Home Exercises Provided 05/02/23      Oxygen   Maintain Oxygen Saturation 88% or higher             Nutrition:  Target Goals: Understanding of nutrition guidelines, daily intake of sodium 1500mg , cholesterol 200mg , calories 30% from fat and 7% or less from saturated fats, daily to have 5 or more servings of fruits and vegetables.  Education: All About Nutrition: -Group instruction provided by verbal, written material, interactive activities, discussions, models, and posters to present  general guidelines for heart healthy nutrition including fat,  fiber, MyPlate, the role of sodium in heart healthy nutrition, utilization of the nutrition label, and utilization of this knowledge for meal planning. Follow up email sent as well. Written material given at graduation.   Biometrics:  Pre Biometrics - 03/27/23 1647       Pre Biometrics   Height 5' 9.2" (1.758 m)    Weight 270 lb 11.2 oz (122.8 kg)    Waist Circumference 56.5 inches    Hip Circumference 55 inches    Waist to Hip Ratio 1.03 %    BMI (Calculated) 39.73    Single Leg Stand 4.5 seconds              Nutrition Therapy Plan and Nutrition Goals:  Nutrition Therapy & Goals - 03/27/23 1650       Nutrition Therapy   RD appointment deferred Yes      Personal Nutrition Goals   Nutrition Goal RD appointment deferred, has another nutrition plan in place      Intervention Plan   Intervention Prescribe, educate and counsel regarding individualized specific dietary modifications aiming towards targeted core components such as weight, hypertension, lipid management, diabetes, heart failure and other comorbidities.;Nutrition handout(s) given to patient.    Expected Outcomes Short Term Goal: Understand basic principles of dietary content, such as calories, fat, sodium, cholesterol and nutrients.;Short Term Goal: A plan has been developed with personal nutrition goals set during dietitian appointment.;Long Term Goal: Adherence to prescribed nutrition plan.             Nutrition Assessments:  MEDIFICTS Score Key: >=70 Need to make dietary changes  40-70 Heart Healthy Diet <= 40 Therapeutic Level Cholesterol Diet  Flowsheet Row Pulmonary Rehab from 03/27/2023 in [facility]  Picture Your Plate Total Score on Admission 57      Picture Your Plate Scores: <62 Unhealthy dietary pattern with much room for improvement. 41-50 Dietary pattern unlikely to meet recommendations for good health and  room for improvement. 51-60 More healthful dietary pattern, with some room for improvement.  >60 Healthy dietary pattern, although there may be some specific behaviors that could be improved.   Nutrition Goals Re-Evaluation:  Nutrition Goals Re-Evaluation     Row Name 04/20/23 [phone] 05/09/23 0926 07/06/23 0956         Goals   Current Weight 278 lb (126.1 kg) 272 lb 4.8 oz (123.5 kg) --     Nutrition Goal -- RD appointment deferred, has another nutrition plan in place --     Comment Patient was informed on why it is important to maintain a balanced diet when dealing with Respiratory issues. Explained that it takes a lot of energy to breath and when they are short of breath often they will need to have a good diet to help keep up with the calories they are expending for breathing. Patient was informed on why it is important to maintain a balanced diet when dealing with Respiratory issues. Ed says that he is still maintaining his diet in order to have enough energy for exercise and manage his weight. Pt Deferred RD appointment     Expected Outcome Short: Choose and plan snacks accordingly to patients caloric intake to improve breathing. Long: Maintain a diet independently that meets their caloric intake to aid in daily shortness of breath. Short: Choose and plan snacks accordingly to patients caloric intake to improve breathing. Long: Maintain a diet independently that meets their caloric intake to aid in daily shortness of breath. --  Nutrition Goals Discharge (Final Nutrition Goals Re-Evaluation):  Nutrition Goals Re-Evaluation - 07/06/23 0956       Goals   Comment Pt Deferred RD appointment             Psychosocial: Target Goals: Acknowledge presence or absence of significant depression and/or stress, maximize coping skills, provide positive support system. Participant is able to verbalize types and ability to use techniques and skills needed for reducing stress and  depression.   Education: Stress, Anxiety, and Depression - Group verbal and visual presentation to define topics covered.  Reviews how body is impacted by stress, anxiety, and depression.  Also discusses healthy ways to reduce stress and to treat/manage anxiety and depression.  Written material given at graduation.   Education: Sleep Hygiene -Provides group verbal and written instruction about how sleep can affect your health.  Define sleep hygiene, discuss sleep cycles and impact of sleep habits. Review good sleep hygiene tips.    Initial Review & Psychosocial Screening:  Initial Psych Review & Screening - 03/16/23 1443       Initial Review   Current issues with None Identified      Family Dynamics   Good Support System? Yes   wife     Barriers   Psychosocial barriers to participate in program There are no identifiable barriers or psychosocial needs.;The patient should benefit from training in stress management and relaxation.      Screening Interventions   Interventions Encouraged to exercise;Provide feedback about the scores to participant;To provide support and resources with identified psychosocial needs    Expected Outcomes Short Term goal: Utilizing psychosocial counselor, staff and physician to assist with identification of specific Stressors or current issues interfering with healing process. Setting desired goal for each stressor or current issue identified.;Long Term Goal: Stressors or current issues are controlled or eliminated.;Short Term goal: Identification and review with participant of any Quality of Life or Depression concerns found by scoring the questionnaire.;Long Term goal: The participant improves quality of Life and PHQ9 Scores as seen by post scores and/or verbalization of changes             Quality of Life Scores:  Scores of 19 and below usually indicate a poorer quality of life in these areas.  A difference of  2-3 points is a clinically meaningful  difference.  A difference of 2-3 points in the total score of the Quality of Life Index has been associated with significant improvement in overall quality of life, self-image, physical symptoms, and general health in studies assessing change in quality of life.  PHQ-9: Review Flowsheet       03/27/2023  Depression screen PHQ 2/9  Decreased Interest 0  Down, Depressed, Hopeless 0  PHQ - 2 Score 0  Altered sleeping 1  Tired, decreased energy 1  Change in appetite 0  Feeling bad or failure about yourself  0  Trouble concentrating 0  Moving slowly or fidgety/restless 0  Suicidal thoughts 0  PHQ-9 Score 2  Difficult doing work/chores Not difficult at all   Interpretation of Total Score  Total Score Depression Severity:  1-4 = Minimal depression, 5-9 = Mild depression, 10-14 = Moderate depression, 15-19 = Moderately severe depression, 20-27 = Severe depression   Psychosocial Evaluation and Intervention:  Psychosocial Evaluation - 03/16/23 1445       Psychosocial Evaluation & Interventions   Interventions Encouraged to exercise with the program and follow exercise prescription    Comments Ed is coming to pulmonary rehab  with dyspnea. His main concern is his stamina. He notes that he is unable to walk as far as he once could. He has lost 60 lbs since January 24, with mainly managing diet and potions and with some help from ozempic but had to stop because of GI issues. His wife has her masters in nutrition so she and he have been working on his diet for a while. When asked about stress, he states he doesn't feel stressed but feels very blessed to have his life. He and his wife are going on another cruise next week which he enjoys and looking forward to. He wants to attend the program to work on his stamina and gain some muscle that he has lost.    Expected Outcomes Short: attend pulmonary rehab for education and exercise. Long; develop and maintain positive self care habits    Continue  Psychosocial Services  Follow up required by staff             Psychosocial Re-Evaluation:  Psychosocial Re-Evaluation     Row Name 04/20/23 0930 05/09/23 0924 07/06/23 0954         Psychosocial Re-Evaluation   Current issues with None Identified None Identified None Identified     Comments Patient reports no issues with their current mental states, sleep, stress, depression or anxiety. Will follow up with patient in a few weeks for any changes. Patient reports no issues with their current mental states, sleep, stress, depression or anxiety. Will follow up with patient in a few weeks for any changes. Ed denies any anxiety, depression, stress currently. Says he has a good support systemt with family. He reports sleeping very well, thought last night was not a good night of sleep.     Expected Outcomes Short: Continue to exercise regularly to support mental health and notify staff of any changes. Long: maintain mental health and well being through teaching of rehab or prescribed medications independently. Short: Continue to exercise regularly to support mental health and notify staff of any changes. Long: maintain mental health and well being through teaching of rehab or prescribed medications independently. STG: Continue to attend pulmonary rehab. LTG: Maintain mental health and well being through teaching of rehab or prescribed medications independently.     Interventions Encouraged to attend Pulmonary Rehabilitation for the exercise Encouraged to attend Pulmonary Rehabilitation for the exercise Encouraged to attend Pulmonary Rehabilitation for the exercise     Continue Psychosocial Services  Follow up required by staff Follow up required by staff Follow up required by staff              Psychosocial Discharge (Final Psychosocial Re-Evaluation):  Psychosocial Re-Evaluation - 07/06/23 0954       Psychosocial Re-Evaluation   Current issues with None Identified    Comments Ed denies  any anxiety, depression, stress currently. Says he has a good support systemt with family. He reports sleeping very well, thought last night was not a good night of sleep.    Expected Outcomes STG: Continue to attend pulmonary rehab. LTG: Maintain mental health and well being through teaching of rehab or prescribed medications independently.    Interventions Encouraged to attend Pulmonary Rehabilitation for the exercise    Continue Psychosocial Services  Follow up required by staff             Education: Education Goals: Education classes will be provided on a weekly basis, covering required topics. Participant will state understanding/return demonstration of topics presented.  Learning Barriers/Preferences:  Learning Barriers/Preferences - 03/16/23 1443       Learning Barriers/Preferences   Learning Barriers None    Learning Preferences None             General Pulmonary Education Topics:  Infection Prevention: - Provides verbal and written material to individual with discussion of infection control including proper hand washing and proper equipment cleaning during exercise session. Flowsheet Row Pulmonary Rehab from 05/11/2023 in [facility] Card[facility] and Pulmonary Rehab  Date 03/27/23  Educator MB  Instruction Review Code 1- Verbalizes Understanding       Falls Prevention: - Provides verbal and written material to individual with discussion of falls prevention and safety. Flowsheet Row Pulmonary Rehab from 05/11/2023 in [facility] Card[facility] and Pulmonary Rehab  Date 03/27/23  Educator MB  Instruction Review Code 1- Verbalizes Understanding       Chronic Lung Disease Review: - Group verbal instruction with posters, models, PowerPoint presentations and videos,  to review new updates, new respiratory medications, new advancements in procedures and treatments. Providing information on websites and "800" numbers for continued self-education. Includes information about supplement oxygen,  available portable oxygen systems, continuous and intermittent flow rates, oxygen safety, concentrators, and Medicare reimbursement for oxygen. Explanation of Pulmonary Drugs, including class, frequency, complications, importance of spacers, rinsing mouth after steroid MDI's, and proper cleaning methods for nebulizers. Review of basic lung anatomy and physiology related to function, structure, and complications of lung disease. Review of risk factors. Discussion about methods for diagnosing sleep apnea and types of masks and machines for OSA. Includes a review of the use of types of environmental controls: home humidity, furnaces, filters, dust mite/pet prevention, HEPA vacuums. Discussion about weather changes, air quality and the benefits of nasal washing. Instruction on Warning signs, infection symptoms, calling MD promptly, preventive modes, and value of vaccinations. Review of effective airway clearance, coughing and/or vibration techniques. Emphasizing that all should Create an Action Plan. Written material given at graduation. Flowsheet Row Pulmonary Rehab from 05/11/2023 in [facility] Pulmonary Rehab  Education need identified 03/27/23       AED/CPR: - Group verbal and written instruction with the use of models to demonstrate the basic use of the AED with the basic ABC's of resuscitation.    Anatomy and Cardiac Procedures: - Group verbal and visual presentation and models provide information about basic cardiac anatomy and function. Reviews the testing methods done to diagnose heart disease and the outcomes of the test results. Describes the treatment choices: Medical Management, Angioplasty, or Coronary Bypass Surgery for treating various heart conditions including Myocardial Infarction, Angina, Valve Disease, and Cardiac Arrhythmias.  Written material given at graduation. Flowsheet Row Pulmonary Rehab from 05/11/2023 in [facility] Cardiac and Pulmonary Rehab  Date 05/11/23  Educator SB   Instruction Review Code 1- Verbalizes Understanding       Medication Safety: - Group verbal and visual instruction to review commonly prescribed medications for heart and lung disease. Reviews the medication, class of the drug, and side effects. Includes the steps to properly store meds and maintain the prescription regimen.  Written material given at graduation.   Other: -Provides group and verbal instruction on various topics (see comments)   Knowledge Questionnaire Score:  Knowledge Questionnaire Score - 03/27/23 1653       Knowledge Questionnaire Score   Pre Score 17/18              Core Components/Risk Factors/Patient Goals at Admission:  Personal Goals and Risk Factors at Admission - 03/27/23 1653  Core Components/Risk Factors/Patient Goals on Admission    Weight Management Yes;Weight Maintenance;Weight Loss   has lost 60 lbs since January of 24- looking to gain muscle   Intervention Weight Management: Develop a combined nutrition and exercise program designed to reach desired caloric intake, while maintaining appropriate intake of nutrient and fiber, sodium and fats, and appropriate energy expenditure required for the weight goal.;Weight Management: Provide education and appropriate resources to help participant work on and attain dietary goals.;Weight Management/Obesity: Establish reasonable short term and long term weight goals.    Admit Weight 270 lb 11.2 oz (122.8 kg)    Goal Weight: Short Term 270 lb (122.5 kg)    Goal Weight: Long Term 270 lb (122.5 kg)    Expected Outcomes Long Term: Adherence to nutrition and physical activity/exercise program aimed toward attainment of established weight goal;Short Term: Continue to assess and modify interventions until short term weight is achieved;Understanding recommendations for meals to include 15-35% energy as protein, 25-35% energy from fat, 35-60% energy from carbohydrates, less than 200mg  of dietary cholesterol,  20-35 gm of total fiber daily;Understanding of distribution of calorie intake throughout the day with the consumption of 4-5 meals/snacks;Weight Maintenance: Understanding of the daily nutrition guidelines, which includes 25-35% calories from fat, 7% or less cal from saturated fats, less than 200mg  cholesterol, less than 1.5gm of sodium, & 5 or more servings of fruits and vegetables daily    Diabetes Yes    Intervention Provide education about proper nutrition, including hydration, and aerobic/resistive exercise prescription along with prescribed medications to achieve blood glucose in normal ranges: Fasting glucose 65-99 mg/dL;Provide education about signs/symptoms and action to take for hypo/hyperglycemia.    Expected Outcomes Short Term: Participant verbalizes understanding of the signs/symptoms and immediate care of hyper/hypoglycemia, proper foot care and importance of medication, aerobic/resistive exercise and nutrition plan for blood glucose control.;Long Term: Attainment of HbA1C < 7%.    Hypertension Yes    Intervention Provide education on lifestyle modifcations including regular physical activity/exercise, weight management, moderate sodium restriction and increased consumption of fresh fruit, vegetables, and low fat dairy, alcohol moderation, and smoking cessation.;Monitor prescription use compliance.    Expected Outcomes Short Term: Continued assessment and intervention until BP is < 140/108mm HG in hypertensive participants. < 130/23mm HG in hypertensive participants with diabetes, heart failure or chronic kidney disease.;Long Term: Maintenance of blood pressure at goal levels.             Education:Diabetes - Individual verbal and written instruction to review signs/symptoms of diabetes, desired ranges of glucose level fasting, after meals and with exercise. Acknowledge that pre and post exercise glucose checks will be done for 3 sessions at entry of program. Flowsheet Row Pulmonary  Rehab from 05/11/2023 in [facility] Cardiac and Pulmonary Rehab  Date 03/27/23  Educator MB  Instruction Review Code 1- Verbalizes Understanding       Know Your Numbers and Heart Failure: - Group verbal and visual instruction to discuss disease risk factors for cardiac and pulmonary disease and treatment options.  Reviews associated critical values for Overweight/Obesity, Hypertension, Cholesterol, and Diabetes.  Discusses basics of heart failure: signs/symptoms and treatments.  Introduces Heart Failure Zone chart for action plan for heart failure.  Written material given at graduation.   Core Components/Risk Factors/Patient Goals Review:   Goals and Risk Factor Review     Row Name 04/20/23 0926 05/09/23 0930 07/06/23 0957         Core Components/Risk Factors/Patient Goals Review   Personal Goals  Review Improve shortness of breath with ADL's Weight Management/Obesity Weight Management/Obesity     Review Spoke to patient about their shortness of breath and what they can do to improve. Patient has been informed of breathing techniques when starting the program. Patient is informed to tell staff if they have had any med changes and that certain meds they are taking or not taking can be causing shortness of breath. Ed states that he is more interested in mainting his weight rather than losing weight. He said that he took ozempic over the summer when he weighed 330lb, but knows he feels comfotable at his current weight of around 260-270lbs. Ed is doing well with his weight, reports he is down ~60lbs in ~47months and is happy to have been able to maintain it.     Expected Outcomes Short: Attend [facility] regularly to improve shortness of breath with ADL's. Long: maintain independence with ADL's Short: Continue to manage diet and exercise in order to maintain weight. Long: Attend rehab in order to manage weight. STG: Continue to follow good eating habits that allowed him to lose weight. LTG: achieve and  maintain and healthy weight              Core Components/Risk Factors/Patient Goals at Discharge (Final Review):   Goals and Risk Factor Review - 07/06/23 0957       Core Components/Risk Factors/Patient Goals Review   Personal Goals Review Weight Management/Obesity    Review Ed is doing well with his weight, reports he is down ~60lbs in ~49months and is happy to have been able to maintain it.    Expected Outcomes STG: Continue to follow good eating habits that allowed him to lose weight. LTG: achieve and maintain and healthy weight             ITP Comments:  ITP Comments     Row Name 03/16/23 1452 03/27/23 1639 03/29/23 0936 04/04/23 0937 04/26/23 1243   ITP Comments Initial phone call completed. Diagnosis can be found in [facility] 11/14. EP Orientation scheduled for Monday 12/16 at 2:30. Completed and gym orientation. Initial ITP created and sent for review to Dr. Jinny Sanders, Medical Director. 30 Day review completed. Medical Director ITP review done, changes made as directed, and signed approval by Medical Director.    new to program First full day of exercise!  Patient was oriented to gym and equipment including functions, settings, policies, and procedures.  Patient's individual exercise prescription and treatment plan were reviewed.  All starting workloads were established based on the results of the 6 minute walk test done at initial orientation visit.  The plan for exercise progression was also introduced and progression will be customized based on patient's performance and goals. 30 Day review completed. Medical Director ITP review done, changes made as directed, and signed approval by Medical Director.   new to program    Row Name 05/24/23 0723 06/21/23 0747 07/19/23 0731       ITP Comments 30 Day review completed. Medical Director ITP review done, changes made as directed, and signed approval by Medical Director. 30 Day review completed. Medical Director ITP review done,  changes made as directed, and signed approval by Medical Director. 30 Day review completed. Medical Director ITP review done, changes made as directed, and signed approval by Medical Director.              Comments:

## 2023-07-25 ENCOUNTER — Encounter: Attending: Pulmonary Disease | Admitting: *Deleted

## 2023-07-25 DIAGNOSIS — R0609 Other forms of dyspnea: Secondary | ICD-10-CM | POA: Diagnosis present

## 2023-07-25 DIAGNOSIS — Z45010 Encounter for checking and testing of cardiac pacemaker pulse generator [battery]: Secondary | ICD-10-CM

## 2023-07-25 NOTE — Progress Notes (Addendum)
 Daily Session Note  Patient Details  Name: Walter Knox MRN: [id number] Date of Birth: 03/10/1941 Referring Provider:   Flowsheet Row Pulmonary Rehab from 03/27/2023 in [facility] Cardiac and Pulmonary Rehab  Referring Provider Erskin Hearing, MD       Encounter Date: 07/25/2023  Check In:  Session Check In - 07/25/23 0941       Check-In   Supervising physician immediately available to respond to emergencies See telemetry face sheet for immediately available ER MD    Location [facility]-Cardiac & Pulmonary Rehab    Staff Present Lyell Samuel, MS, Exercise Physiologist;Arieh Bogue, RN, BSN, CCRP;Noah Tickle, BS, Exercise Physiologist;Maxon Conetta BS, Exercise Physiologist    Virtual Visit No    Medication change Yes, stopped losartan hydrochlorothiazide  started Entresto   Fall or balance concerns reported    No    Warm-up and Cool-down Performed on first and last piece of equipment    Resistance Training Performed Yes    VAD Patient? No    PAD/SET Patient? No      Pain Assessment   Currently in Pain? No/denies                Social History   Tobacco Use  Smoking Status Former   Current packs/day: 0.00   Average packs/day: 2.0 packs/day for 40.0 years (80.0 ttl pk-yrs)   Types: Cigarettes   Start date: 10/09/1961   Quit date: 10/09/2001   Years since quitting: 21.8  Smokeless Tobacco Never    Goals Met:  Independence with exercise equipment Exercise tolerated well No report of concerns or symptoms today  Goals Unmet:  Not Applicable  Comments: Pt able to follow exercise prescription today without complaint.  Will continue to monitor for progression.    Dr. Firman Hughes is Medical Director for [facility] Cardiac Rehabilitation.  Dr. Fuad Aleskerov is Medical Director for [facility] Pulmonary Rehabilitation.

## 2023-07-27 ENCOUNTER — Encounter: Admitting: *Deleted

## 2023-07-27 DIAGNOSIS — R0609 Other forms of dyspnea: Secondary | ICD-10-CM | POA: Diagnosis not present

## 2023-07-27 NOTE — Progress Notes (Signed)
 Daily Session Note  Patient Details  Name: SHAHEED SCHMUCK MRN: [id number] Date of Birth: [birth year] Referring Provider:   Flowsheet Row Pulmonary Rehab from 03/27/2023 in [facility] Cardiac and Pulmonary Rehab  Referring Provider Erskin Hearing, MD       Encounter Date: 07/27/2023  Check In:  Session Check In - 07/27/23 0927       Check-In   Supervising physician immediately available to respond to emergencies See telemetry face sheet for immediately available ER MD    Location [facility]-Cardiac & Pulmonary Rehab    Staff Present Maud Sorenson, RN, BSN, CCRP;Noah Tickle, BS, Exercise Physiologist;Maxon Conetta BS, Exercise Physiologist;Jason Martina Sledge RDN,LDN    Virtual Visit No    Medication changes reported     No    Fall or balance concerns reported    No    Warm-up and Cool-down Performed on first and last piece of equipment    Resistance Training Performed Yes    VAD Patient? No    PAD/SET Patient? No      Pain Assessment   Currently in Pain? No/denies                Social History   Tobacco Use  Smoking Status Former   Current packs/day: 0.00   Average packs/day: 2.0 packs/day for 40.0 years (80.0 ttl pk-yrs)   Types: Cigarettes   Start date: 10/09/1961   Quit date: 10/09/2001   Years since quitting: 21.8  Smokeless Tobacco Never    Goals Met:  Proper associated with RPD/PD & O2 Sat Independence with exercise equipment Exercise tolerated well No report of concerns or symptoms today  Goals Unmet:  Not Applicable  Comments: Pt able to follow exercise prescription today without complaint.  Will continue to monitor for progression.    Dr. Firman Hughes is Medical Director for [facility] Cardiac Rehabilitation.  Dr. Fuad Aleskerov is Medical Director for [facility] Pulmonary Rehabilitation.

## 2023-08-01 ENCOUNTER — Encounter: Admitting: *Deleted

## 2023-08-01 DIAGNOSIS — R0609 Other forms of dyspnea: Secondary | ICD-10-CM | POA: Diagnosis not present

## 2023-08-01 NOTE — Progress Notes (Signed)
 Daily Session Note  Patient Details  Name: Walter Knox MRN: [id number] Date of Birth: [birth year] Referring Provider:   Flowsheet Row Pulmonary Rehab from 03/27/2023 in [facility] Cardiac and Pulmonary Rehab  Referring Provider Erskin Hearing, MD       Encounter Date: 08/01/2023  Check In:  Session Check In - 08/01/23 0934       Check-In   Supervising physician immediately available to respond to emergencies See telemetry face sheet for immediately available ER MD    Location [facility]-Cardiac & Pulmonary Rehab    Staff Present Lyell Samuel, MS, Exercise Physiologist;Maxon Conetta BS, Exercise Physiologist;Noah Tickle, BS, Exercise Physiologist;Meir Elwood, RN, BSN, CCRP    Virtual Visit No    Medication changes reported     No    Fall or balance concerns reported    No    Warm-up and Cool-down Performed on first and last piece of equipment    Resistance Training Performed Yes    VAD Patient? No    PAD/SET Patient? No      Pain Assessment   Currently in Pain? No/denies                Social History   Tobacco Use  Smoking Status Former   Current packs/day: 0.00   Average packs/day: 2.0 packs/day for 40.0 years (80.0 ttl pk-yrs)   Types: Cigarettes   Start date: 10/09/1961   Quit date: 10/09/2001   Years since quitting: 21.8  Smokeless Tobacco Never    Goals Met:  Proper associated with RPD/PD & O2 Sat Independence with exercise equipment Exercise tolerated well No report of concerns or symptoms today  Goals Unmet:  Not Applicable  Comments: Pt able to follow exercise prescription today without complaint.  Will continue to monitor for progression.    Dr. Firman Hughes is Medical Director for [facility] Cardiac Rehabilitation.  Dr. Fuad Aleskerov is Medical Director for [facility] Pulmonary Rehabilitation.

## 2023-08-03 ENCOUNTER — Encounter: Admitting: *Deleted

## 2023-08-03 DIAGNOSIS — R0609 Other forms of dyspnea: Secondary | ICD-10-CM | POA: Diagnosis not present

## 2023-08-03 NOTE — Progress Notes (Signed)
 Daily Session Note  Patient Details  Name: Walter Knox MRN: [id number] Date of Birth: 09/14/1940 Referring Provider:   Flowsheet Row Pulmonary Rehab from 03/27/2023 in [facility] Cardiac and Pulmonary Rehab  Referring Provider Erskin Hearing, MD       Encounter Date: 08/03/2023  Check In:  Session Check In - 08/03/23 0945       Check-In   Supervising physician immediately available to respond to emergencies See telemetry face sheet for immediately available ER MD    Location [facility]-Cardiac & Pulmonary Rehab    Staff Present Maxon Conetta BS, Exercise Physiologist;Noah Tickle, BS, Exercise Physiologist;Moriya Mitchell, RN, BSN, CCRP;Joseph Hood RCP,RRT,BSRT    Virtual Visit No    Medication changes reported     No    Fall or balance concerns reported    No    Warm-up and Cool-down Performed on first and last piece of equipment    Resistance Training Performed Yes    VAD Patient? No    PAD/SET Patient? No      Pain Assessment   Currently in Pain? No/denies                Social History   Tobacco Use  Smoking Status Former   Current packs/day: 0.00   Average packs/day: 2.0 packs/day for 40.0 years (80.0 ttl pk-yrs)   Types: Cigarettes   Start date: 10/09/1961   Quit date: 10/09/2001   Years since quitting: 21.8  Smokeless Tobacco Never    Goals Met:  Proper associated with RPD/PD & O2 Sat Independence with exercise equipment Exercise tolerated well No report of concerns or symptoms today  Goals Unmet:  Not Applicable  Comments: Pt able to follow exercise prescription today without complaint.  Will continue to monitor for progression.    Dr. Firman Hughes is Medical Director for [facility] Cardiac Rehabilitation.  Dr. Fuad Aleskerov is Medical Director for [facility] Pulmonary Rehabilitation.

## 2023-08-08 ENCOUNTER — Encounter: Admitting: *Deleted

## 2023-08-08 VITALS — Ht 69.2 in | Wt 272.2 lb

## 2023-08-08 DIAGNOSIS — R0609 Other forms of dyspnea: Secondary | ICD-10-CM

## 2023-08-08 NOTE — Progress Notes (Signed)
 Daily Session Note  Patient Details  Name: Walter Knox MRN: [id number] Date of Birth: March 15, 1941 Referring Provider:   Flowsheet Row Pulmonary Rehab from 03/27/2023 in [facility] Cardiac and Pulmonary Rehab  Referring Provider Erskin Hearing, MD       Encounter Date: 08/08/2023  Check In:  Session Check In - 08/08/23 0926       Check-In   Supervising physician immediately available to respond to emergencies See telemetry face sheet for immediately available ER MD    Location [facility]-Cardiac & Pulmonary Rehab    Staff Present Lyell Samuel, MS, Exercise Physiologist;Maxon Conetta BS, Exercise Physiologist;Noah Tickle, BS, Exercise Physiologist;Laneah Luft, RN, BSN, CCRP    Virtual Visit No    Medication changes reported     No    Fall or balance concerns reported    No    Warm-up and Cool-down Performed on first and last piece of equipment    Resistance Training Performed Yes    VAD Patient? No    PAD/SET Patient? No      Pain Assessment   Currently in Pain? No/denies                Social History   Tobacco Use  Smoking Status Former   Current packs/day: 0.00   Average packs/day: 2.0 packs/day for 40.0 years (80.0 ttl pk-yrs)   Types: Cigarettes   Start date: 10/09/1961   Quit date: 10/09/2001   Years since quitting: 21.8  Smokeless Tobacco Never    Goals Met:  Proper associated with RPD/PD & O2 Sat Independence with exercise equipment Exercise tolerated well No report of concerns or symptoms today  Goals Unmet:  Not Applicable  Comments: Pt able to follow exercise prescription today without complaint.  Will continue to monitor for progression.    Dr. Firman Hughes is Medical Director for [facility] Cardiac Rehabilitation.  Dr. Fuad Aleskerov is Medical Director for [facility] Pulmonary Rehabilitation.

## 2023-08-08 NOTE — Patient Instructions (Signed)
 Discharge Patient Instructions  Patient Details  Name: Walter Knox MRN: [id number] Date of Birth: November 15, 1940 Referring Provider:  Monique Ano, MD   Number of Visits: 22  Reason for Discharge:  Patient reached a stable level of exercise. Patient independent in their exercise. Patient has met program and personal goals.  Smoking History:  Social History   Tobacco Use  Smoking Status Former   Current packs/day: 0.00   Average packs/day: 2.0 packs/day for 40.0 years (80.0 ttl pk-yrs)   Types: Cigarettes   Start date: 10/09/1961   Quit date: 10/09/2001   Years since quitting: 21.8  Smokeless Tobacco Never    Diagnosis:  Dyspnea on exertion  Initial Exercise Prescription:  Initial Exercise Prescription - 03/27/23 1600       Date of Initial Exercise RX and Referring Provider   Date 03/27/23    Referring Provider Erskin Hearing, MD      Oxygen   Maintain Oxygen Saturation 88% or higher      Treadmill   MPH 2    Grade 0    Minutes 15    METs 2.53      Recumbant Bike   Level 1    RPM 50    Watts 15    Minutes 15    METs 1.45      NuStep   Level 1    SPM 80    Minutes 15    METs 1.45      Track   Laps 28    Minutes 15    METs 2.52      Prescription Details   Frequency (times per week) 2    Duration Progress to 30 minutes of continuous aerobic without signs/symptoms of physical distress      Intensity   THRR 40-80% of Max Heartrate 90-121    Ratings of Perceived Exertion 11-13    Perceived Dyspnea 0-4      Progression   Progression Continue to progress workloads to maintain intensity without signs/symptoms of physical distress.      Resistance Training   Training Prescription Yes    Weight 7 lb    Reps 10-15             Discharge Exercise Prescription (Final Exercise Prescription Changes):  Exercise Prescription Changes - 07/13/23 1600       Response to Exercise   Blood Pressure (Admit) 104/70    Blood Pressure (Exit) 104/62     Heart Rate (Admit) 60 bpm    Heart Rate (Exercise) 115 bpm    Heart Rate (Exit) 62 bpm    Oxygen Saturation (Admit) 98 %    Oxygen Saturation (Exercise) 92 %    Oxygen Saturation (Exit) 94 %    Rating of Perceived Exertion (Exercise) 15    Perceived Dyspnea (Exercise) 3    Symptoms none    Duration Continue with 30 min of aerobic exercise without signs/symptoms of physical distress.    Intensity THRR unchanged      Progression   Progression Continue to progress workloads to maintain intensity without signs/symptoms of physical distress.    Average METs 3.96      Resistance Training   Training Prescription Yes    Weight 7 lb    Reps 10-15      Interval Training   Interval Training No      REL-XR   Level 9    Minutes 15    METs 9.3      T5 Nustep  Level 5    Minutes 15    METs 3.7      Track   Laps 31    Minutes 15    METs 2.69      Home Exercise Plan   Plans to continue exercise at [employer] (comment)   Looking at a few different gyms ([place], [employer], or [employer]) to do aerobic and resistance exercise. Will also golf some.   Frequency Add 3 additional days to program exercise sessions.    Initial Home Exercises Provided 05/02/23      Oxygen   Maintain Oxygen Saturation 88% or higher             Functional Capacity:  6 Minute Walk     Row Name 03/27/23 1640 08/08/23 0936       6 Minute Walk   Phase Initial Discharge    Distance 1185 feet 1420 feet    Distance % Change -- 19.83 %    Distance Feet Change -- 235 ft    Walk Time 6 minutes 6 minutes    # of Rest Breaks 0 0    MPH 2.24 2.69    METS 1.45 1.78    RPE 5.07 15    Perceived Dyspnea  3 3    VO2 Peak 5.07 6.23    Symptoms Yes (comment) Yes (comment)    Comments SOB SOB    Resting HR 60 bpm 67 bpm    Resting BP 122/80 118/70    Resting Oxygen Saturation  93 % 95 %    Exercise Oxygen Saturation  during 6 min walk 94 % 94 %    Max Ex. HR 101 bpm 105 bpm    Max Ex. BP 154/76 140/74     2 Minute Post BP 126/76 138/74      Interval HR   1 Minute HR 96 90    2 Minute HR 101 86    3 Minute HR 80 97    4 Minute HR 84 99    5 Minute HR 84 97    6 Minute HR 90 105    2 Minute Post HR 60 91    Interval Heart Rate? Yes Yes      Interval Oxygen   Interval Oxygen? Yes Yes    Baseline Oxygen Saturation % 93 % 95 %    1 Minute Oxygen Saturation % 94 % 94 %    1 Minute Liters of Oxygen 0 L 0 L    2 Minute Oxygen Saturation % 95 % 95 %    2 Minute Liters of Oxygen 0 L 0 L    3 Minute Oxygen Saturation % 95 % 95 %    3 Minute Liters of Oxygen 0 L 0 L    4 Minute Oxygen Saturation % 96 % 95 %    4 Minute Liters of Oxygen 0 L 0 L    5 Minute Oxygen Saturation % 96 % 96 %    5 Minute Liters of Oxygen 0 L 0 L    6 Minute Oxygen Saturation % 96 % 97 %    6 Minute Liters of Oxygen 0 L 0 L    2 Minute Post Oxygen Saturation % 97 % 98 %    2 Minute Post Liters of Oxygen 0 L 0 L            Nutrition & Weight - Outcomes:  Pre Biometrics - 03/27/23 1647  Pre Biometrics   Height 5' 9.2" (1.758 m)    Weight 270 lb 11.2 oz (122.8 kg)    Waist Circumference 56.5 inches    Hip Circumference 55 inches    Waist to Hip Ratio 1.03 %    BMI (Calculated) 39.73    Single Leg Stand 4.5 seconds             Post Biometrics - 08/08/23 0941        Post  Biometrics   Height 5' 9.2" (1.758 m)    Weight 272 lb 3.2 oz (123.5 kg)    Waist Circumference 55 inches    Hip Circumference 52.8 inches    Waist to Hip Ratio 1.04 %    BMI (Calculated) 39.95    Single Leg Stand 6 seconds

## 2023-08-10 ENCOUNTER — Encounter

## 2023-08-15 ENCOUNTER — Encounter: Attending: Pulmonary Disease | Admitting: *Deleted

## 2023-08-15 DIAGNOSIS — R0609 Other forms of dyspnea: Secondary | ICD-10-CM | POA: Insufficient documentation

## 2023-08-15 NOTE — Progress Notes (Signed)
 Daily Session Note  Patient Details  Name: Walter Knox MRN: [id number] Date of Birth: [birth year] Referring Provider:   Flowsheet Row Pulmonary Rehab from 03/27/2023 in [facility] Cardiac and Pulmonary Rehab  Referring Provider Erskin Hearing, MD       Encounter Date: 08/15/2023  Check In:  Session Check In - 08/15/23 0940       Check-In   Supervising physician immediately available to respond to emergencies See telemetry face sheet for immediately available ER MD    Location [facility]-Cardiac & Pulmonary Rehab    Staff Present Maud Sorenson, RN, BSN, CCRP;Margaret Best, MS, Exercise Physiologist;Maxon Conetta BS, Exercise Physiologist;Noah Tickle, BS, Exercise Physiologist    Virtual Visit No    Medication changes reported     No    Fall or balance concerns reported    No    Warm-up and Cool-down Performed on first and last piece of equipment    Resistance Training Performed Yes    VAD Patient? No    PAD/SET Patient? No      Pain Assessment   Currently in Pain? No/denies                Social History   Tobacco Use  Smoking Status Former   Current packs/day: 0.00   Average packs/day: 2.0 packs/day for 40.0 years (80.0 ttl pk-yrs)   Types: Cigarettes   Start date: 10/09/1961   Quit date: 10/09/2001   Years since quitting: 21.8  Smokeless Tobacco Never    Goals Met:  Proper associated with RPD/PD & O2 Sat Independence with exercise equipment Exercise tolerated well No report of concerns or symptoms today  Goals Unmet:  Not Applicable  Comments: Pt able to follow exercise prescription today without complaint.  Will continue to monitor for progression.    Dr. Firman Hughes is Medical Director for [facility] Cardiac Rehabilitation.  Dr. Fuad Aleskerov is Medical Director for [facility] Pulmonary Rehabilitation.

## 2023-08-16 ENCOUNTER — Encounter: Payer: Self-pay | Admitting: *Deleted

## 2023-08-16 DIAGNOSIS — R0609 Other forms of dyspnea: Secondary | ICD-10-CM

## 2023-08-16 NOTE — Progress Notes (Signed)
 Pulmonary Individual Treatment Plan  Patient Details  Name: Walter Knox MRN: [id number] Date of Birth: 1940/11/08 Referring Provider:   Flowsheet Row Pulmonary Rehab from 03/27/2023 in [facility] Cardiac and Pulmonary Rehab  Referring Provider Erskin Hearing, MD       Initial Encounter Date:  Flowsheet Row Pulmonary Rehab from 03/27/2023 in [facility] Cardiac and Pulmonary Rehab  Date 03/27/23       Visit Diagnosis: Dyspnea on exertion  Patient's Home Medications on Admission:  Current Outpatient Medications:    acetaminophen  (TYLENOL ) 500 MG tablet, Take 1,000 mg by mouth 2 (two) times daily., Disp: , Rfl:    amiodarone  (PACERONE ) 200 MG tablet, Take 200 mg by mouth in the morning., Disp: , Rfl:    cephALEXin  (KEFLEX ) 500 MG capsule, Take 1 capsule (500 mg total) by mouth 2 (two) times daily., Disp: 20 capsule, Rfl: 0   docusate sodium (COLACE) 100 MG capsule, Take 100 mg by mouth daily., Disp: , Rfl:    dutasteride  (AVODART ) 0.5 MG capsule, Take 0.5 mg by mouth daily., Disp: , Rfl:    ELIQUIS  5 MG TABS tablet, Take 5 mg by mouth 2 (two) times daily., Disp: , Rfl:    ezetimibe  (ZETIA ) 10 MG tablet, Take 10 mg by mouth at bedtime., Disp: , Rfl:    FARXIGA 10 MG TABS tablet, Take 10 mg by mouth daily., Disp: , Rfl:    Loratadine 10 MG CAPS, Take 10 mg by mouth daily., Disp: , Rfl:    losartan -hydrochlorothiazide  (HYZAAR) 50-12.5 MG tablet, Take 1 tablet by mouth daily. (Patient not taking: Reported on 07/25/2023), Disp: , Rfl:    lovastatin  (ALTOPREV ) 20 MG 24 hr tablet, Take 20 mg by mouth at bedtime., Disp: , Rfl:    metFORMIN (GLUCOPHAGE-XR) 500 MG 24 hr tablet, Take 500 mg by mouth daily with supper., Disp: , Rfl:    metoprolol  tartrate (LOPRESSOR ) 100 MG tablet, Take 100 mg by mouth 2 (two) times daily., Disp: , Rfl:    Multiple Vitamin (MULTIVITAMIN) tablet, Take 1 tablet by mouth daily., Disp: , Rfl:    omeprazole (PRILOSEC) 20 MG capsule, Take 20 mg by mouth at bedtime. , Disp: ,  Rfl:    PHENYLEPHRINE -GUAIFENESIN PO, Take 400 mg by mouth daily as needed (Congestion). Mucinex, Disp: , Rfl:    polycarbophil (FIBERCON) 625 MG tablet, Take 1,875 mg by mouth 2 (two) times daily., Disp: , Rfl:    sacubitril-valsartan (ENTRESTO) 24-26 MG, Take 1 tablet by mouth 2 (two) times daily., Disp: , Rfl:    torsemide (DEMADEX) 20 MG tablet, Take 20 mg by mouth daily., Disp: , Rfl:   Past Medical History: Past Medical History:  Diagnosis Date   Anginal pain (HCC)    BPH (benign prostatic hyperplasia)    CAD (coronary artery disease)    Cancer (HCC)    Chronic back pain    Dyspnea    Dysrhythmia    Environmental and seasonal allergies    Hyperlipidemia    Hypertension    Lower extremity edema    Morbid obesity (HCC)    Pre-diabetes    PVD (peripheral vascular disease) (HCC)    SSS (sick sinus syndrome) (HCC)     Tobacco Use: Social History   Tobacco Use  Smoking Status Former   Current packs/day: 0.00   Average packs/day: 2.0 packs/day for 40.0 years (80.0 ttl pk-yrs)   Types: Cigarettes   Start date: 10/09/1961   Quit date: 10/09/2001   Years since quitting: 21.8  Smokeless Tobacco Never    Labs: Review Flowsheet        No data to display           Pulmonary Assessment Scores:  Pulmonary Assessment Scores     Row Name 03/27/23 1647 04/04/23 0927 08/08/23 0942     ADL UCSD   ADL Phase -- Entry --   SOB Score total -- 32 --   Rest -- 0 --   Walk -- 0  Starts at 0 and becomes 4 --   Stairs -- 4 --   Bath -- 0 --   Dress -- 0 --   Shop -- 1 --     CAT Score   CAT Score 10 -- --     mMRC Score   mMRC Score 3 -- 1            UCSD: Self-administered rating of dyspnea associated with activities of daily living (ADLs) 6-point scale (0 = "not at all" to 5 = "maximal or unable to do because of breathlessness")  Scoring Scores range from 0 to 120.  Minimally important difference is 5 units  CAT: CAT can identify the health impairment of COPD  patients and is better correlated with disease progression.  CAT has a scoring range of zero to 40. The CAT score is classified into four groups of low (less than 10), medium (10 - 20), high (21-30) and very high (31-40) based on the impact level of disease on health status. A CAT score over 10 suggests significant symptoms.  A worsening CAT score could be explained by an exacerbation, poor medication adherence, poor inhaler technique, or progression of COPD or comorbid conditions.  CAT MCID is 2 points  mMRC: mMRC (Modified Medical Research Council) Dyspnea Scale is used to assess the degree of baseline functional disability in patients of respiratory disease due to dyspnea. No minimal important difference is established. A decrease in score of 1 point or greater is considered a positive change.   Pulmonary Function Assessment:   Exercise Target Goals: Exercise Program Goal: Individual exercise prescription set using results from initial 6 min walk test and THRR while considering  patient's activity barriers and safety.   Exercise Prescription Goal: Initial exercise prescription builds to 30-45 minutes a day of aerobic activity, 2-3 days per week.  Home exercise guidelines will be given to patient during program as part of exercise prescription that the participant will acknowledge.  Education: Aerobic Exercise: - Group verbal and visual presentation on the components of exercise prescription. Introduces F.I.T.T principle from ACSM for exercise prescriptions.  Reviews F.I.T.T. principles of aerobic exercise including progression. Written material given at graduation.   Education: Resistance Exercise: - Group verbal and visual presentation on the components of exercise prescription. Introduces F.I.T.T principle from ACSM for exercise prescriptions  Reviews F.I.T.T. principles of resistance exercise including progression. Written material given at graduation. Flowsheet Row Pulmonary Rehab from  05/11/2023 in [facility] Cardiac and Pulmonary Rehab  Date 04/13/23  Educator MB  Instruction Review Code 1- [employer] Understanding        Education: Exercise & Equipment Safety: - Individual verbal instruction and demonstration of equipment use and safety with use of the equipment. Flowsheet Row Pulmonary Rehab from 05/11/2023 in [facility] Cardiac and Pulmonary Rehab  Date 03/27/23  Educator MB  Instruction Review Code 1- Verbalizes Understanding       Education: Exercise Physiology & General Exercise Guidelines: - Group verbal and written instruction with models to review the exercise  physiology of the cardiovascular system and associated critical values. Provides general exercise guidelines with specific guidelines to those with heart or lung disease.    Education: Flexibility, Balance, Mind/Body Relaxation: - Group verbal and visual presentation with interactive activity on the components of exercise prescription. Introduces F.I.T.T principle from ACSM for exercise prescriptions. Reviews F.I.T.T. principles of flexibility and balance exercise training including progression. Also discusses the mind body connection.  Reviews various relaxation techniques to help reduce and manage stress (i.e. Deep breathing, progressive muscle relaxation, and visualization). Balance handout provided to take home. Written material given at graduation. Flowsheet Row Pulmonary Rehab from 05/11/2023 in [facility] Cardiac and Pulmonary Rehab  Date 04/13/23  Educator MB  Instruction Review Code 1- Verbalizes Understanding       Activity Barriers & Risk Stratification:  Activity Barriers & Cardiac Risk Stratification - 03/27/23 1641       Activity Barriers & Cardiac Risk Stratification   Activity Barriers Left Knee Replacement;Right Knee Replacement;Deconditioning             6 Minute Walk:  6 Minute Walk     Row Name 03/27/23 1640 08/08/23 0936       6 Minute Walk   Phase Initial Discharge    Distance  1185 feet 1420 feet    Distance % Change -- 19.83 %    Distance Feet Change -- 235 ft    Walk Time 6 minutes 6 minutes    # of Rest Breaks 0 0    MPH 2.24 2.69    METS 1.45 1.78    RPE 5.07 15    Perceived Dyspnea  3 3    VO2 Peak 5.07 6.23    Symptoms Yes (comment) Yes (comment)    Comments SOB SOB    Resting HR 60 bpm 67 bpm    Resting BP 122/80 118/70    Resting Oxygen Saturation  93 % 95 %    Exercise Oxygen Saturation  during 6 min walk 94 % 94 %    Max Ex. HR 101 bpm 105 bpm    Max Ex. BP 154/76 140/74    2 Minute Post BP 126/76 138/74      Interval HR   1 Minute HR 96 90    2 Minute HR 101 86    3 Minute HR 80 97    4 Minute HR 84 99    5 Minute HR 84 97    6 Minute HR 90 105    2 Minute Post HR 60 91    Interval Heart Rate? Yes Yes      Interval Oxygen   Interval Oxygen? Yes Yes    Baseline Oxygen Saturation % 93 % 95 %    1 Minute Oxygen Saturation % 94 % 94 %    1 Minute Liters of Oxygen 0 L 0 L    2 Minute Oxygen Saturation % 95 % 95 %    2 Minute Liters of Oxygen 0 L 0 L    3 Minute Oxygen Saturation % 95 % 95 %    3 Minute Liters of Oxygen 0 L 0 L    4 Minute Oxygen Saturation % 96 % 95 %    4 Minute Liters of Oxygen 0 L 0 L    5 Minute Oxygen Saturation % 96 % 96 %    5 Minute Liters of Oxygen 0 L 0 L    6 Minute Oxygen Saturation % 96 % 97 %  6 Minute Liters of Oxygen 0 L 0 L    2 Minute Post Oxygen Saturation % 97 % 98 %    2 Minute Post Liters of Oxygen 0 L 0 L            Oxygen Initial Assessment:  Oxygen Initial Assessment - 03/16/23 1431       Home Oxygen   Home Oxygen Device None    Sleep Oxygen Prescription None    Home Exercise Oxygen Prescription None    Home Resting Oxygen Prescription None    Compliance with Home Oxygen Use Yes      Intervention   Short Term Goals To learn and understand importance of monitoring SPO2 with pulse oximeter and demonstrate accurate use of the pulse oximeter.;To learn and understand importance of  maintaining oxygen saturations>88%;To learn and demonstrate proper pursed lip breathing techniques or other breathing techniques. ;To learn and demonstrate proper use of respiratory medications    Long  Term Goals Verbalizes importance of monitoring SPO2 with pulse oximeter and return demonstration;Maintenance of O2 saturations>88%;Exhibits proper breathing techniques, such as pursed lip breathing or other method taught during program session;Compliance with respiratory medication;Demonstrates proper use of MDI's             Oxygen Re-Evaluation:  Oxygen Re-Evaluation     Row Name 04/04/23 0934 05/09/23 0933 07/06/23 0953         Program Oxygen Prescription   Program Oxygen Prescription None None None       Home Oxygen   Home Oxygen Device None None None     Sleep Oxygen Prescription None None None     Home Exercise Oxygen Prescription None None None     Home Resting Oxygen Prescription None None None     Compliance with Home Oxygen Use Yes Yes Yes       Goals/Expected Outcomes   Short Term Goals To learn and demonstrate proper pursed lip breathing techniques or other breathing techniques.  To learn and demonstrate proper pursed lip breathing techniques or other breathing techniques.  To learn and demonstrate proper pursed lip breathing techniques or other breathing techniques.      Long  Term Goals Exhibits proper breathing techniques, such as pursed lip breathing or other method taught during program session Exhibits proper breathing techniques, such as pursed lip breathing or other method taught during program session Exhibits proper breathing techniques, such as pursed lip breathing or other method taught during program session     Comments Reviewed PLB technique with pt.  Talked about how it works and it's importance in maintaining their exercise saturations. Reviewed PLB technique with pt.  Talked about how it works and it's importance in maintaining their exercise saturations.  Reviewed PLB with Walter Knox and he verbalized understanding     Goals/Expected Outcomes Short: Become more profiecient at using PLB. Long: Become independent at using PLB. Short: Become more profiecient at using PLB. Long: Become independent at using PLB. STG: Become more profiecient at using PLB. LTG: Become independent at using PLB.              Oxygen Discharge (Final Oxygen Re-Evaluation):  Oxygen Re-Evaluation - 07/06/23 0953       Program Oxygen Prescription   Program Oxygen Prescription None      Home Oxygen   Home Oxygen Device None    Sleep Oxygen Prescription None    Home Exercise Oxygen Prescription None    Home Resting Oxygen Prescription None    Compliance  with Home Oxygen Use Yes      Goals/Expected Outcomes   Short Term Goals To learn and demonstrate proper pursed lip breathing techniques or other breathing techniques.     Long  Term Goals Exhibits proper breathing techniques, such as pursed lip breathing or other method taught during program session    Comments Reviewed PLB with Walter Knox and he verbalized understanding    Goals/Expected Outcomes STG: Become more profiecient at using PLB. LTG: Become independent at using PLB.             Initial Exercise Prescription:  Initial Exercise Prescription - 03/27/23 1600       Date of Initial Exercise RX and Referring Provider   Date 03/27/23    Referring Provider Erskin Hearing, MD      Oxygen   Maintain Oxygen Saturation 88% or higher      Treadmill   MPH 2    Grade 0    Minutes 15    METs 2.53      Recumbant Bike   Level 1    RPM 50    Watts 15    Minutes 15    METs 1.45      NuStep   Level 1    SPM 80    Minutes 15    METs 1.45      Track   Laps 28    Minutes 15    METs 2.52      Prescription Details   Frequency (times per week) 2    Duration Progress to 30 minutes of continuous aerobic without signs/symptoms of physical distress      Intensity   THRR 40-80% of Max Heartrate 90-121     Ratings of Perceived Exertion 11-13    Perceived Dyspnea 0-4      Progression   Progression Continue to progress workloads to maintain intensity without signs/symptoms of physical distress.      Resistance Training   Training Prescription Yes    Weight 7 lb    Reps 10-15             Perform Capillary Blood Glucose checks as needed.  Exercise Prescription Changes:   Exercise Prescription Changes     Row Name 03/27/23 1600 04/19/23 1300 05/02/23 1000 05/03/23 1400 05/16/23 0700     Response to Exercise   Blood Pressure (Admit) 122/80 120/80 120/80 104/60 126/74   Blood Pressure (Exercise) 154/76 142/68 142/68 128/70 126/88   Blood Pressure (Exit) 126/76 106/62 106/62 102/64 98/58   Heart Rate (Admit) 60 bpm 60 bpm 60 bpm 79 bpm 83 bpm   Heart Rate (Exercise) 101 bpm 95 bpm 95 bpm 107 bpm 108 bpm   Heart Rate (Exit) 60 bpm 81 bpm 81 bpm 85 bpm 78 bpm   Oxygen Saturation (Admit) 93 % 93 % 93 % 96 % 95 %   Oxygen Saturation (Exercise) 94 % 93 % 93 % 95 % 93 %   Oxygen Saturation (Exit) 97 % 96 % 96 % 97 % 93 %   Rating of Perceived Exertion (Exercise) 15 15 15 14 14    Perceived Dyspnea (Exercise) 3 2 2 3  --   Symptoms SOB -- -- none none   Comments results -- -- -- --   Duration Progress to 30 minutes of  aerobic without signs/symptoms of physical distress Progress to 30 minutes of  aerobic without signs/symptoms of physical distress Progress to 30 minutes of  aerobic without signs/symptoms of physical distress Progress  to 30 minutes of  aerobic without signs/symptoms of physical distress Progress to 30 minutes of  aerobic without signs/symptoms of physical distress   Intensity THRR New THRR unchanged THRR unchanged THRR unchanged THRR unchanged     Progression   Progression Continue to progress workloads to maintain intensity without signs/symptoms of physical distress. Continue to progress workloads to maintain intensity without signs/symptoms of physical distress.  Continue to progress workloads to maintain intensity without signs/symptoms of physical distress. Continue to progress workloads to maintain intensity without signs/symptoms of physical distress. Continue to progress workloads to maintain intensity without signs/symptoms of physical distress.   Average METs 1.45 3.5 3.5 3.66 3.98     Resistance Training   Training Prescription -- Yes Yes Yes Yes   Weight -- 7 lb 7 lb 7 lb 7 lb   Reps -- 10-15 10-15 10-15 10-15     Interval Training   Interval Training -- No No No No     Treadmill   MPH -- 2 2 2.2 2.8   Grade -- 0 0 0 0   Minutes -- 15 15 15 15    METs -- 2.53 2.53 2.69 3.14     NuStep   Level -- 4 4 5 5    Minutes -- 15 15 15 15    METs -- 5.6 5.6 5.2 5.8     Home Exercise Plan   Plans to continue exercise at -- -- [employer] (comment)  Looking at a few different gyms ([place], [employer], or [employer]) to do aerobic and resistance exercise. Will also golf some. [employer] (comment)  Looking at a few different gyms ([place], [employer], or [employer]) to do aerobic and resistance exercise. Will also golf some. [employer] (comment)  Looking at a few different gyms ([place], [employer], or [employer]) to do aerobic and resistance exercise. Will also golf some.   Frequency -- -- Add 3 additional days to program exercise sessions. Add 3 additional days to program exercise sessions. Add 3 additional days to program exercise sessions.   Initial Home Exercises Provided -- -- 05/02/23 05/02/23 05/02/23     Oxygen   Maintain Oxygen Saturation -- 88% or higher 88% or higher 88% or higher 88% or higher    Row Name 06/01/23 1700 06/13/23 1500 06/26/23 1400 07/13/23 1600 08/10/23 1100     Response to Exercise   Blood Pressure (Admit) 118/68 100/74 108/70 104/70 122/70   Blood Pressure (Exercise) 122/64 -- -- -- --   Blood Pressure (Exit) 126/70 104/58 110/68 104/62 98/58   Heart Rate (Admit) 71 bpm 60 bpm 75 bpm 60 bpm 93 bpm   Heart Rate (Exercise)  115 bpm 80 bpm 102 bpm 115 bpm 127 bpm   Heart Rate (Exit) 88 bpm -- 83 bpm 62 bpm 84 bpm   Oxygen Saturation (Admit) 97 % 96 % 94 % 98 % 96 %   Oxygen Saturation (Exercise) 93 % 93 % 92 % 92 % 93 %   Oxygen Saturation (Exit) 97 % 97 % 94 % 94 % 92 %   Rating of Perceived Exertion (Exercise) 16 16 16 15 15    Perceived Dyspnea (Exercise) 3 3 3 3 3    Symptoms none none none none none   Duration Progress to 30 minutes of  aerobic without signs/symptoms of physical distress Continue with 30 min of aerobic exercise without signs/symptoms of physical distress. Continue with 30 min of aerobic exercise without signs/symptoms of physical distress. Continue with 30 min of aerobic exercise without  signs/symptoms of physical distress. Continue with 30 min of aerobic exercise without signs/symptoms of physical distress.   Intensity THRR unchanged THRR unchanged THRR unchanged THRR unchanged THRR unchanged     Progression   Progression Continue to progress workloads to maintain intensity without signs/symptoms of physical distress. Continue to progress workloads to maintain intensity without signs/symptoms of physical distress. Continue to progress workloads to maintain intensity without signs/symptoms of physical distress. Continue to progress workloads to maintain intensity without signs/symptoms of physical distress. Continue to progress workloads to maintain intensity without signs/symptoms of physical distress.   Average METs 3.9 3.22 3.34 3.96 4.23     Resistance Training   Training Prescription Yes Yes Yes Yes Yes   Weight 7 lb 7 lb 7 lb 7 lb 10 lb   Reps 10-15 10-15 10-15 10-15 10-15     Interval Training   Interval Training No No No No No     Treadmill   MPH 2.8 2.8 -- -- --   Grade 0 0 -- -- --   Minutes 15 15 -- -- --   METs 3.14 3.14 -- -- --     NuStep   Level 6 -- -- -- 5   Minutes 15 -- -- -- 15   METs 6.5 -- -- -- 3.4     REL-XR   Level -- -- 8 9 10    Minutes -- -- 15 15 15     METs -- -- 5.4 9.3 7     T5 Nustep   Level -- 5 5 5 5    SPM -- 80 -- -- --   Minutes -- 15 15 15 15    METs -- 3.3 3.6 3.7 3.4     Track   Laps -- -- 30 31 42   Minutes -- -- 15 15 15    METs -- -- 2.63 2.69 3.28     Home Exercise Plan   Plans to continue exercise at [employer] (comment)  Looking at a few different gyms ([place], [employer], or [employer]) to do aerobic and resistance exercise. Will also golf some. -- [employer] (comment)  Looking at a few different gyms ([place], [employer], or [employer]) to do aerobic and resistance exercise. Will also golf some. [employer] (comment)  Looking at a few different gyms ([place], [employer], or [employer]) to do aerobic and resistance exercise. Will also golf some. [employer] (comment)  Looking at a few different gyms ([place], [employer], or [employer]) to do aerobic and resistance exercise. Will also golf some.   Frequency Add 3 additional days to program exercise sessions. -- Add 3 additional days to program exercise sessions. Add 3 additional days to program exercise sessions. Add 3 additional days to program exercise sessions.   Initial Home Exercises Provided 05/02/23 -- 05/02/23 05/02/23 05/02/23     Oxygen   Maintain Oxygen Saturation 88% or higher -- 88% or higher 88% or higher 88% or higher            Exercise Comments:   Exercise Comments     Row Name 04/04/23 1610           Exercise Comments First full day of exercise!  Patient was oriented to gym and equipment including functions, settings, policies, and procedures.  Patient's individual exercise prescription and treatment plan were reviewed.  All starting workloads were established based on the results of the 6 minute walk test done at initial orientation visit.  The plan for exercise progression was also introduced and progression will  be customized based on patient's performance and goals.                Exercise Goals and Review:   Exercise Goals     Row Name  03/27/23 1646             Exercise Goals   Increase Physical Activity Yes       Intervention Provide advice, education, support and counseling about physical activity/exercise needs.;Develop an individualized exercise prescription for aerobic and resistive training based on initial evaluation findings, risk stratification, comorbidities and participant's personal goals.       Expected Outcomes Short Term: Attend rehab on a regular basis to increase amount of physical activity.;Long Term: Add in home exercise to make exercise part of routine and to increase amount of physical activity.;Long Term: Exercising regularly at least 3-5 days a week.       Increase Strength and Stamina Yes       Intervention Provide advice, education, support and counseling about physical activity/exercise needs.;Develop an individualized exercise prescription for aerobic and resistive training based on initial evaluation findings, risk stratification, comorbidities and participant's personal goals.       Expected Outcomes Short Term: Increase workloads from initial exercise prescription for resistance, speed, and METs.;Short Term: Perform resistance training exercises routinely during rehab and add in resistance training at home;Long Term: Improve cardiorespiratory fitness, muscular endurance and strength as measured by increased METs and functional capacity ( )       Able to understand and use rate of perceived exertion (RPE) scale Yes       Intervention Provide education and explanation on how to use RPE scale       Expected Outcomes Short Term: Able to use RPE daily in rehab to express subjective intensity level;Long Term:  Able to use RPE to guide intensity level when exercising independently       Able to understand and use Dyspnea scale Yes       Intervention Provide education and explanation on how to use Dyspnea scale       Expected Outcomes Short Term: Able to use Dyspnea scale daily in rehab to express  subjective sense of shortness of breath during exertion;Long Term: Able to use Dyspnea scale to guide intensity level when exercising independently       Knowledge and understanding of Target Heart Rate Range (THRR) Yes       Intervention Provide education and explanation of THRR including how the numbers were predicted and where they are located for reference       Expected Outcomes Short Term: Able to state/look up THRR;Short Term: Able to use daily as guideline for intensity in rehab;Long Term: Able to use THRR to govern intensity when exercising independently       Able to check pulse independently Yes       Intervention Provide education and demonstration on how to check pulse in carotid and radial arteries.;Review the importance of being able to check your own pulse for safety during independent exercise       Expected Outcomes Short Term: Able to explain why pulse checking is important during independent exercise;Long Term: Able to check pulse independently and accurately       Understanding of Exercise Prescription Yes       Intervention Provide education, explanation, and written materials on patient's individual exercise prescription       Expected Outcomes Short Term: Able to explain program exercise prescription;Long Term: Able to explain home exercise prescription  to exercise independently                Exercise Goals Re-Evaluation :  Exercise Goals Re-Evaluation     Row Name 04/04/23 0935 04/19/23 1357 05/02/23 1033 05/03/23 1427 05/16/23 0757     Exercise Goal Re-Evaluation   Exercise Goals Review Able to understand and use rate of perceived exertion (RPE) scale;Knowledge and understanding of Target Heart Rate Range (THRR);Understanding of Exercise Prescription;Able to understand and use Dyspnea scale Increase Physical Activity;Increase Strength and Stamina;Understanding of Exercise Prescription Able to check pulse independently;Able to understand and use Dyspnea scale;Increase  Strength and Stamina;Understanding of Exercise Prescription;Knowledge and understanding of Target Heart Rate Range (THRR);Able to understand and use rate of perceived exertion (RPE) scale;Increase Physical Activity Increase Physical Activity;Increase Strength and Stamina;Understanding of Exercise Prescription Increase Physical Activity;Increase Strength and Stamina;Understanding of Exercise Prescription   Comments Reviewed RPE and dyspnea scale, THR and program prescription with pt today.  Pt voiced understanding and was given a copy of goals to take home. Walter Knox is off to a great start in the program. He has attended his first 4 sessions of the program, and has been able to increase to level 4 on the T4 nustep. He is also off to a great start on the treadmill at a workload of and no incline. We will continue to monitor his progress in the program. Reviewed home exercise with pt today from 9:37am to 10:00am.  Pt plans to go to a gym facility ([employer], [employer], or [place]) for aerobic and resistance exercise 3 additional days for exercise. He also will golf 2 days a week when the weather is appropriate. Reviewed THR, pulse, RPE, sign and symptoms, pulse oximetery and when to call 911 or MD.  Also discussed weather considerations and indoor options.  Pt voiced understanding. Walter Knox continues to do well in rehab. He was recently able to increase his speed on the treadmill from to 2.30mph. He was also able to increase his level on the T4 nustep from level 4 to 5. We will continue to monitor his progress in the program. Walter Knox is doing well in the program. He has recently been able to increase his speed on the treadmill from 2.2 to 2.46mph. He was also able to maintain an intensity of level 5 on the T4 nustep. We will continue to monitor his progress in the program.   Expected Outcomes Short: Use RPE daily to regulate intensity. Long: Follow program prescription in THR. Short: Continue to dollow exercise prescription and  increase workloads when able. Long: Continue exercise to improve strength and stamina. Short: Add 3 additional days at home for exercise. Long: Continue to exercise independently for 5 days a week to improve strength and stamina. Short: Continue to increase treadmill workloads. Long: Continue exercise to increase strength and stamina. Short: Continue to increase treadmill workloads. Long: Continue exercise to increase strength and stamina.    Row Name 06/01/23 1746 06/13/23 1512 06/26/23 1422 07/06/23 0950 07/13/23 1613     Exercise Goal Re-Evaluation   Exercise Goals Review Increase Physical Activity;Increase Strength and Stamina;Understanding of Exercise Prescription Increase Physical Activity;Increase Strength and Stamina;Understanding of Exercise Prescription Increase Physical Activity;Increase Strength and Stamina;Understanding of Exercise Prescription Increase Physical Activity;Increase Strength and Stamina;Understanding of Exercise Prescription Increase Physical Activity;Increase Strength and Stamina;Understanding of Exercise Prescription   Comments Walter Knox is doing well in rehab. He was able to increase his level on the T4 nustep from 5 to 6. He was also able to maintain  his speed of 2. on the treadmill. We will continue to monitor his progress in the program. Walter Knox returned from his cruise/trip and completed 1 session in this review. He was able to maintain his speed of 2. on the treadmill and worked at level 5 on the T5 nustep as he gets back to the program. We will continue to monitor his progress in the program. Walter Knox continues to do well in rehab. He started walking on the track instead of the treadmill as it is a higher intensity for him and was able to do 30 laps in this review. He also did level 8 on the XR and maintained level 5 on the T5 nustep. We will continue to monitor his progress in the program. Walter Knox is doign well at rehab. He is walking on the track today. says he is unusally tired, says he  didnt sleep as well as he usually does. Commended him for still coming to rehab and reminded him to take breaks if needed. Encouraged him to increase workload as he is able. He reports he walks on days he doesnt attent rehab. Walter Knox continues to make improvements in the program. He was able to increase his laps on the track from 30 to 31 in 15 minutes. He was also able to increase from level 8 to 9 on the XR. We will continue to monitor his progress in the program.   Expected Outcomes Short: Continue to increase treadmill workloads. Long: Continue exercise to increase strength and stamina. Short: Continue to progressively increase treadmill and T5 nustep workloads. Long: Continue exercise to improve strength and stamina. Short: Push for more laps on the track. Long: Continue exercise to improve strength and stamina STG: increase workload as able. LTG: Continue to exercise to improve strength and stamina Short: Continue to increase track laps. Long: Continue exercise to improve strength and stamina.    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 He plans to return the week of 4/14. Walter Knox continues to do well in rehab. He is due for his post this week and hopes to improve. He increased his laps on the track to 42 laps. He also increased to level 10 on the XR and increased his handweights to 10lbs for resistance. We will continue to monitor his progress in the program.      Expected Outcomes Short: Return to rehab when cleared. Long: Continue exercise to improve strength and stamina. Short: Improve on post . Long: Continue to increase overall METs  and stamina.               Discharge Exercise Prescription (Final Exercise Prescription Changes):  Exercise Prescription Changes - 08/10/23 1100       Response to Exercise   Blood Pressure (Admit) 122/70    Blood Pressure (Exit) 98/58    Heart Rate (Admit) 93 bpm    Heart Rate (Exercise) 127 bpm    Heart Rate (Exit) 84 bpm    Oxygen Saturation (Admit) 96 %    Oxygen Saturation (Exercise) 93 %    Oxygen Saturation (Exit) 92 %    Rating of Perceived Exertion (Exercise) 15  Perceived Dyspnea (Exercise) 3    Symptoms none    Duration Continue with 30 min of aerobic exercise without signs/symptoms of physical distress.    Intensity THRR unchanged      Progression   Progression Continue to progress workloads to maintain intensity without signs/symptoms of physical distress.    Average METs 4.23      Resistance Training   Training Prescription Yes    Weight 10 lb    Reps 10-15      Interval Training   Interval Training No      NuStep   Level 5    Minutes 15    METs 3.4      REL-XR   Level 10    Minutes 15    METs 7      T5 Nustep   Level 5    Minutes 15    METs 3.4      Track   Laps 42    Minutes 15    METs 3.28      Home Exercise Plan   Plans to continue exercise at [employer] (comment)   Looking at a few different gyms ([place], [employer], or [employer]) to do aerobic and resistance exercise. Will also golf some.   Frequency Add 3 additional days to program exercise sessions.    Initial Home Exercises Provided 05/02/23      Oxygen   Maintain Oxygen Saturation 88% or higher             Nutrition:  Target Goals: Understanding of nutrition guidelines, daily intake of sodium 1500mg , cholesterol 200mg , calories 30% from fat and 7% or less from saturated fats, daily to have 5 or more servings of fruits and vegetables.  Education: All About Nutrition: -Group instruction provided by verbal, written material, interactive activities, discussions,  models, and posters to present general guidelines for heart healthy nutrition including fat, fiber, MyPlate, the role of sodium in heart healthy nutrition, utilization of the nutrition label, and utilization of this knowledge for meal planning. Follow up email sent as well. Written material given at graduation.   Biometrics:  Pre Biometrics - 03/27/23 1647       Pre Biometrics   Height 5' 9.2" (1.758 m)    Weight 270 lb 11.2 oz (122.8 kg)    Waist Circumference 56.5 inches    Hip Circumference 55 inches    Waist to Hip Ratio 1.03 %    BMI (Calculated) 39.73    Single Leg Stand 4.5 seconds             Post Biometrics - 08/08/23 0941        Post  Biometrics   Height 5' 9.2" (1.758 m)    Weight 272 lb 3.2 oz (123.5 kg)    Waist Circumference 55 inches    Hip Circumference 52.8 inches    Waist to Hip Ratio 1.04 %    BMI (Calculated) 39.95    Single Leg Stand 6 seconds             Nutrition Therapy Plan and Nutrition Goals:  Nutrition Therapy & Goals - 03/27/23 1650       Nutrition Therapy   RD appointment deferred Yes      Personal Nutrition Goals   Nutrition Goal RD appointment deferred, has another nutrition plan in place      Intervention Plan   Intervention Prescribe, educate and counsel regarding individualized specific dietary modifications aiming towards targeted core components such as weight, hypertension,  lipid management, diabetes, heart failure and other comorbidities.;Nutrition handout(s) given to patient.    Expected Outcomes Short Term Goal: Understand basic principles of dietary content, such as calories, fat, sodium, cholesterol and nutrients.;Short Term Goal: A plan has been developed with personal nutrition goals set during dietitian appointment.;Long Term Goal: Adherence to prescribed nutrition plan.             Nutrition Assessments:  MEDIFICTS Score Key: >=70 Need to make dietary changes  40-70 Heart Healthy Diet <= 40 Therapeutic  Level Cholesterol Diet  Flowsheet Row Pulmonary Rehab from 03/27/2023 in [facility] Cardiac and Pulmonary Rehab  Picture Your Plate Total Score on Admission 57      Picture Your Plate Scores: <16 Unhealthy dietary pattern with much room for improvement. 41-50 Dietary pattern unlikely to meet recommendations for good health and room for improvement. 51-60 More healthful dietary pattern, with some room for improvement.  >60 Healthy dietary pattern, although there may be some specific behaviors that could be improved.   Nutrition Goals Re-Evaluation:  Nutrition Goals Re-Evaluation     Row Name 04/20/23 [phone] 05/09/23 0926 07/06/23 0956 08/03/23 0955       Goals   Current Weight 278 lb (126.1 kg) 272 lb 4.8 oz (123.5 kg) -- 275 lb (124.7 kg)    Nutrition Goal -- RD appointment deferred, has another nutrition plan in place -- --    Comment Patient was informed on why it is important to maintain a balanced diet when dealing with Respiratory issues. Explained that it takes a lot of energy to breath and when they are short of breath often they will need to have a good diet to help keep up with the calories they are expending for breathing. Patient was informed on why it is important to maintain a balanced diet when dealing with Respiratory issues. Walter Knox says that he is still maintaining his diet in order to have enough energy for exercise and manage his weight. Pt Deferred RD appointment Pt Deferred RD appointment    Expected Outcome Short: Choose and plan snacks accordingly to patients caloric intake to improve breathing. Long: Maintain a diet independently that meets their caloric intake to aid in daily shortness of breath. Short: Choose and plan snacks accordingly to patients caloric intake to improve breathing. Long: Maintain a diet independently that meets their caloric intake to aid in daily shortness of breath. -- --             Nutrition Goals Discharge (Final Nutrition Goals Re-Evaluation):   Nutrition Goals Re-Evaluation - 08/03/23 0955       Goals   Current Weight 275 lb (124.7 kg)    Comment Pt Deferred RD appointment             Psychosocial: Target Goals: Acknowledge presence or absence of significant depression and/or stress, maximize coping skills, provide positive support system. Participant is able to verbalize types and ability to use techniques and skills needed for reducing stress and depression.   Education: Stress, Anxiety, and Depression - Group verbal and visual presentation to define topics covered.  Reviews how body is impacted by stress, anxiety, and depression.  Also discusses healthy ways to reduce stress and to treat/manage anxiety and depression.  Written material given at graduation.   Education: Sleep Hygiene -Provides group verbal and written instruction about how sleep can affect your health.  Define sleep hygiene, discuss sleep cycles and impact of sleep habits. Review good sleep hygiene tips.    Initial Review &  Psychosocial Screening:  Initial Psych Review & Screening - 03/16/23 1443       Initial Review   Current issues with None Identified      Family Dynamics   Good Support System? Yes   wife     Barriers   Psychosocial barriers to participate in program There are no identifiable barriers or psychosocial needs.;The patient should benefit from training in stress management and relaxation.      Screening Interventions   Interventions Encouraged to exercise;Provide feedback about the scores to participant;To provide support and resources with identified psychosocial needs    Expected Outcomes Short Term goal: Utilizing psychosocial counselor, staff and physician to assist with identification of specific Stressors or current issues interfering with healing process. Setting desired goal for each stressor or current issue identified.;Long Term Goal: Stressors or current issues are controlled or eliminated.;Short Term goal: Identification  and review with participant of any Quality of Life or Depression concerns found by scoring the questionnaire.;Long Term goal: The participant improves quality of Life and PHQ9 Scores as seen by post scores and/or verbalization of changes             Quality of Life Scores:  Scores of 19 and below usually indicate a poorer quality of life in these areas.  A difference of  2-3 points is a clinically meaningful difference.  A difference of 2-3 points in the total score of the Quality of Life Index has been associated with significant improvement in overall quality of life, self-image, physical symptoms, and general health in studies assessing change in quality of life.  PHQ-9: Review Flowsheet       03/27/2023  Depression screen PHQ 2/9  Decreased Interest 0  Down, Depressed, Hopeless 0  PHQ - 2 Score 0  Altered sleeping 1  Tired, decreased energy 1  Change in appetite 0  Feeling bad or failure about yourself  0  Trouble concentrating 0  Moving slowly or fidgety/restless 0  Suicidal thoughts 0  PHQ-9 Score 2  Difficult doing work/chores Not difficult at all   Interpretation of Total Score  Total Score Depression Severity:  1-4 = Minimal depression, 5-9 = Mild depression, 10-14 = Moderate depression, 15-19 = Moderately severe depression, 20-27 = Severe depression   Psychosocial Evaluation and Intervention:  Psychosocial Evaluation - 03/16/23 1445       Psychosocial Evaluation & Interventions   Interventions Encouraged to exercise with the program and follow exercise prescription    Comments Walter Knox is coming to pulmonary rehab with dyspnea. His main concern is his stamina. He notes that he is unable to walk as far as he once could. He has lost 60 lbs since January 24, with mainly managing diet and potions and with some help from ozempic but had to stop because of GI issues. His wife has her masters in nutrition so she and he have been working on his diet for a while. When asked about  stress, he states he doesn't feel stressed but feels very blessed to have his life. He and his wife are going on another cruise next week which he enjoys and looking forward to. He wants to attend the program to work on his stamina and gain some muscle that he has lost.    Expected Outcomes Short: attend pulmonary rehab for education and exercise. Long; develop and maintain positive self care habits    Continue Psychosocial Services  Follow up required by staff  Psychosocial Re-Evaluation:  Psychosocial Re-Evaluation     Row Name 04/20/23 0930 05/09/23 8119 07/06/23 0954 08/03/23 0949       Psychosocial Re-Evaluation   Current issues with None Identified None Identified None Identified None Identified    Comments Patient reports no issues with their current mental states, sleep, stress, depression or anxiety. Will follow up with patient in a few weeks for any changes. Patient reports no issues with their current mental states, sleep, stress, depression or anxiety. Will follow up with patient in a few weeks for any changes. Walter Knox denies any anxiety, depression, stress currently. Says he has a good support systemt with family. He reports sleeping very well, thought last night was not a good night of sleep. Walter Knox doesnt report having any stress, or sleep concerns at this time. He has a good support system thats made up of his wife, children and grandchildren.    Expected Outcomes Short: Continue to exercise regularly to support mental health and notify staff of any changes. Long: maintain mental health and well being through teaching of rehab or prescribed medications independently. Short: Continue to exercise regularly to support mental health and notify staff of any changes. Long: maintain mental health and well being through teaching of rehab or prescribed medications independently. STG: Continue to attend pulmonary rehab. LTG: Maintain mental health and well being through teaching of rehab  or prescribed medications independently. Short: Continue to attend rehab. Long: Continue to maintain a positive mental health with exercise, and healthy outlets.    Interventions Encouraged to attend Pulmonary Rehabilitation for the exercise Encouraged to attend Pulmonary Rehabilitation for the exercise Encouraged to attend Pulmonary Rehabilitation for the exercise Encouraged to attend Pulmonary Rehabilitation for the exercise    Continue Psychosocial Services  Follow up required by staff Follow up required by staff Follow up required by staff Follow up required by staff             Psychosocial Discharge (Final Psychosocial Re-Evaluation):  Psychosocial Re-Evaluation - 08/03/23 0949       Psychosocial Re-Evaluation   Current issues with None Identified    Comments Walter Knox doesnt report having any stress, or sleep concerns at this time. He has a good support system thats made up of his wife, children and grandchildren.    Expected Outcomes Short: Continue to attend rehab. Long: Continue to maintain a positive mental health with exercise, and healthy outlets.    Interventions Encouraged to attend Pulmonary Rehabilitation for the exercise    Continue Psychosocial Services  Follow up required by staff             Education: Education Goals: Education classes will be provided on a weekly basis, covering required topics. Participant will state understanding/return demonstration of topics presented.  Learning Barriers/Preferences:  Learning Barriers/Preferences - 03/16/23 1443       Learning Barriers/Preferences   Learning Barriers None    Learning Preferences None             General Pulmonary Education Topics:  Infection Prevention: - Provides verbal and written material to individual with discussion of infection control including proper hand washing and proper equipment cleaning during exercise session. Flowsheet Row Pulmonary Rehab from 05/11/2023 in [facility] Cardiac and Pulmonary  Rehab  Date 03/27/23  Educator MB  Instruction Review Code 1- Verbalizes Understanding       Falls Prevention: - Provides verbal and written material to individual with discussion of falls prevention and safety. Flowsheet Row Pulmonary Rehab from 05/11/2023 in  [facility] Cardiac and Pulmonary Rehab  Date 03/27/23  Educator MB  Instruction Review Code 1- Verbalizes Understanding       Chronic Lung Disease Review: - Group verbal instruction with posters, models, PowerPoint presentations and videos,  to review new updates, new respiratory medications, new advancements in procedures and treatments. Providing information on websites and "800" numbers for continued self-education. Includes information about supplement oxygen, available portable oxygen systems, continuous and intermittent flow rates, oxygen safety, concentrators, and Medicare reimbursement for oxygen. Explanation of Pulmonary Drugs, including class, frequency, complications, importance of spacers, rinsing mouth after steroid MDI's, and proper cleaning methods for nebulizers. Review of basic lung anatomy and physiology related to function, structure, and complications of lung disease. Review of risk factors. Discussion about methods for diagnosing sleep apnea and types of masks and machines for OSA. Includes a review of the use of types of environmental controls: home humidity, furnaces, filters, dust mite/pet prevention, HEPA vacuums. Discussion about weather changes, air quality and the benefits of nasal washing. Instruction on Warning signs, infection symptoms, calling MD promptly, preventive modes, and value of vaccinations. Review of effective airway clearance, coughing and/or vibration techniques. Emphasizing that all should Create an Action Plan. Written material given at graduation. Flowsheet Row Pulmonary Rehab from 05/11/2023 in [facility] Cardiac and Pulmonary Rehab  Education need identified 03/27/23       AED/CPR: - Group verbal  and written instruction with the use of models to demonstrate the basic use of the AED with the basic ABC's of resuscitation.    Anatomy and Cardiac Procedures: - Group verbal and visual presentation and models provide information about basic cardiac anatomy and function. Reviews the testing methods done to diagnose heart disease and the outcomes of the test results. Describes the treatment choices: Medical Management, Angioplasty, or Coronary Bypass Surgery for treating various heart conditions including Myocardial Infarction, Angina, Valve Disease, and Cardiac Arrhythmias.  Written material given at graduation. Flowsheet Row Pulmonary Rehab from 05/11/2023 in [facility] Cardiac and Pulmonary Rehab  Date 05/11/23  Educator SB  Instruction Review Code 1- Verbalizes Understanding       Medication Safety: - Group verbal and visual instruction to review commonly prescribed medications for heart and lung disease. Reviews the medication, class of the drug, and side effects. Includes the steps to properly store meds and maintain the prescription regimen.  Written material given at graduation.   Other: -Provides group and verbal instruction on various topics (see comments)   Knowledge Questionnaire Score:  Knowledge Questionnaire Score - 03/27/23 1653       Knowledge Questionnaire Score   Pre Score 17/18              Core Components/Risk Factors/Patient Goals at Admission:  Personal Goals and Risk Factors at Admission - 03/27/23 1653       Core Components/Risk Factors/Patient Goals on Admission    Weight Management Yes;Weight Maintenance;Weight Loss   has lost 60 lbs since January of 24- looking to gain muscle   Intervention Weight Management: Develop a combined nutrition and exercise program designed to reach desired caloric intake, while maintaining appropriate intake of nutrient and fiber, sodium and fats, and appropriate energy expenditure required for the weight goal.;Weight  Management: Provide education and appropriate resources to help participant work on and attain dietary goals.;Weight Management/Obesity: Establish reasonable short term and long term weight goals.    Admit Weight 270 lb 11.2 oz (122.8 kg)    Goal Weight: Short Term 270 lb (122.5 kg)  Goal Weight: Long Term 270 lb (122.5 kg)    Expected Outcomes Long Term: Adherence to nutrition and physical activity/exercise program aimed toward attainment of established weight goal;Short Term: Continue to assess and modify interventions until short term weight is achieved;Understanding recommendations for meals to include 15-35% energy as protein, 25-35% energy from fat, 35-60% energy from carbohydrates, less than 200mg  of dietary cholesterol, 20-35 gm of total fiber daily;Understanding of distribution of calorie intake throughout the day with the consumption of 4-5 meals/snacks;Weight Maintenance: Understanding of the daily nutrition guidelines, which includes 25-35% calories from fat, 7% or less cal from saturated fats, less than 200mg  cholesterol, less than 1.5gm of sodium, & 5 or more servings of fruits and vegetables daily    Diabetes Yes    Intervention Provide education about proper nutrition, including hydration, and aerobic/resistive exercise prescription along with prescribed medications to achieve blood glucose in normal ranges: Fasting glucose 65-99 mg/dL;Provide education about signs/symptoms and action to take for hypo/hyperglycemia.    Expected Outcomes Short Term: Participant verbalizes understanding of the signs/symptoms and immediate care of hyper/hypoglycemia, proper foot care and importance of medication, aerobic/resistive exercise and nutrition plan for blood glucose control.;Long Term: Attainment of HbA1C < 7%.    Hypertension Yes    Intervention Provide education on lifestyle modifcations including regular physical activity/exercise, weight management, moderate sodium restriction and increased  consumption of fresh fruit, vegetables, and low fat dairy, alcohol moderation, and smoking cessation.;Monitor prescription use compliance.    Expected Outcomes Short Term: Continued assessment and intervention until BP is < 140/66mm HG in hypertensive participants. < 130/69mm HG in hypertensive participants with diabetes, heart failure or chronic kidney disease.;Long Term: Maintenance of blood pressure at goal levels.             Education:Diabetes - Individual verbal and written instruction to review signs/symptoms of diabetes, desired ranges of glucose level fasting, after meals and with exercise. Acknowledge that pre and post exercise glucose checks will be done for 3 sessions at entry of program. Flowsheet Row Pulmonary Rehab from 05/11/2023 in [facility] Cardiac and Pulmonary Rehab  Date 03/27/23  Educator MB  Instruction Review Code 1- Verbalizes Understanding       Know Your Numbers and Heart Failure: - Group verbal and visual instruction to discuss disease risk factors for cardiac and pulmonary disease and treatment options.  Reviews associated critical values for Overweight/Obesity, Hypertension, Cholesterol, and Diabetes.  Discusses basics of heart failure: signs/symptoms and treatments.  Introduces Heart Failure Zone chart for action plan for heart failure.  Written material given at graduation.   Core Components/Risk Factors/Patient Goals Review:   Goals and Risk Factor Review     Row Name 04/20/23 0926 05/09/23 0930 07/06/23 0957 08/03/23 0956       Core Components/Risk Factors/Patient Goals Review   Personal Goals Review Improve shortness of breath with ADL's Weight Management/Obesity Weight Management/Obesity Weight Management/Obesity    Review Spoke to patient about their shortness of breath and what they can do to improve. Patient has been informed of breathing techniques when starting the program. Patient is informed to tell staff if they have had any med changes and that  certain meds they are taking or not taking can be causing shortness of breath. Walter Knox states that he is more interested in mainting his weight rather than losing weight. He said that he took ozempic over the summer when he weighed 330lb, but knows he feels comfotable at his current weight of around 260-270lbs. Walter Knox is doing well  with his weight, reports he is down ~60lbs in ~32months and is happy to have been able to maintain it. Walter Knox is happy with his weight, and would like to maintain.    Expected Outcomes Short: Attend [facility] regularly to improve shortness of breath with ADL's. Long: maintain independence with ADL's Short: Continue to manage diet and exercise in order to maintain weight. Long: Attend rehab in order to manage weight. STG: Continue to follow good eating habits that allowed him to lose weight. LTG: achieve and maintain and healthy weight Short: Continue to exercise and diet to maintain weight. Long: Continue to maintain weight.             Core Components/Risk Factors/Patient Goals at Discharge (Final Review):   Goals and Risk Factor Review - 08/03/23 0956       Core Components/Risk Factors/Patient Goals Review   Personal Goals Review Weight Management/Obesity    Review Walter Knox is happy with his weight, and would like to maintain.    Expected Outcomes Short: Continue to exercise and diet to maintain weight. Long: Continue to maintain weight.             ITP Comments:  ITP Comments     Row Name 03/16/23 1452 03/27/23 1639 03/29/23 0936 04/04/23 0937 04/26/23 1243   ITP Comments Initial phone call completed. Diagnosis can be found in [facility] 11/14. EP Orientation scheduled for Monday 12/16 at 2:30. Completed and gym orientation. Initial ITP created and sent for review to Dr. Faud Aleskerov, Medical Director. 30 Day review completed. Medical Director ITP review done, changes made as directed, and signed approval by Medical Director.    new to program First full day of exercise!   Patient was oriented to gym and equipment including functions, settings, policies, and procedures.  Patient's individual exercise prescription and treatment plan were reviewed.  All starting workloads were established based on the results of the 6 minute walk test done at initial orientation visit.  The plan for exercise progression was also introduced and progression will be customized based on patient's performance and goals. 30 Day review completed. Medical Director ITP review done, changes made as directed, and signed approval by Medical Director.   new to program    Row Name 05/24/23 0723 06/21/23 0747 07/19/23 0731 08/16/23 0824     ITP Comments 30 Day review completed. Medical Director ITP review done, changes made as directed, and signed approval by Medical Director. 30 Day review completed. Medical Director ITP review done, changes made as directed, and signed approval by Medical Director. 30 Day review completed. Medical Director ITP review done, changes made as directed, and signed approval by Medical Director. 30 Day review completed. Medical Director ITP review done, changes made as directed, and signed approval by Medical Director.             Comments:

## 2023-08-17 ENCOUNTER — Encounter: Admitting: *Deleted

## 2023-08-17 DIAGNOSIS — R0609 Other forms of dyspnea: Secondary | ICD-10-CM | POA: Diagnosis not present

## 2023-08-17 NOTE — Progress Notes (Signed)
 Daily Session Note  Patient Details  Name: Walter Knox MRN: [id number] Date of Birth: April 22, 1940 Referring Provider:   Flowsheet Row Pulmonary Rehab from 03/27/2023 in [facility] Cardiac and Pulmonary Rehab  Referring Provider Erskin Hearing, MD       Encounter Date: 08/17/2023  Check In:  Session Check In - 08/17/23 1135       Check-In   Supervising physician immediately available to respond to emergencies See telemetry face sheet for immediately available ER MD    Location [facility]-Cardiac & Pulmonary Rehab    Staff Present Maud Sorenson, RN, BSN, CCRP;Meredith Manson Seitz RN,BSN;Joseph Hood RCP,RRT,BSRT;Maxon Grandview BS, Exercise Physiologist;Jason Martina Sledge RDN,LDN    Virtual Visit No    Medication changes reported     No    Fall or balance concerns reported    No    Warm-up and Cool-down Performed on first and last piece of equipment    Resistance Training Performed Yes    VAD Patient? No    PAD/SET Patient? No      Pain Assessment   Currently in Pain? No/denies                Social History   Tobacco Use  Smoking Status Former   Current packs/day: 0.00   Average packs/day: 2.0 packs/day for 40.0 years (80.0 ttl pk-yrs)   Types: Cigarettes   Start date: 10/09/1961   Quit date: 10/09/2001   Years since quitting: 21.8  Smokeless Tobacco Never    Goals Met:  Proper associated with RPD/PD & O2 Sat Independence with exercise equipment Exercise tolerated well No report of concerns or symptoms today  Goals Unmet:  Not Applicable  Comments: Pt able to follow exercise prescription today without complaint.  Will continue to monitor for progression.    Dr. Firman Hughes is Medical Director for [facility] Cardiac Rehabilitation.  Dr. Fuad Aleskerov is Medical Director for [facility] Pulmonary Rehabilitation.

## 2023-08-22 ENCOUNTER — Encounter: Admitting: *Deleted

## 2023-08-22 DIAGNOSIS — R0609 Other forms of dyspnea: Secondary | ICD-10-CM

## 2023-08-22 NOTE — Progress Notes (Signed)
 Daily Session Note  Patient Details  Name: AMIRJON BOWLING MRN: [id number] Date of Birth: 17-Apr-1940 Referring Provider:   Flowsheet Row Pulmonary Rehab from 03/27/2023 in [facility] Cardiac and Pulmonary Rehab  Referring Provider Erskin Hearing, MD       Encounter Date: 08/22/2023  Check In:  Session Check In - 08/22/23 1010       Check-In   Supervising physician immediately available to respond to emergencies See telemetry face sheet for immediately available ER MD    Location [facility]-Cardiac & Pulmonary Rehab    Staff Present Maud Sorenson, RN, BSN, CCRP;Margaret Best, MS, Exercise Physiologist;Maxon Conetta BS, Exercise Physiologist;Jason Martina Sledge RDN,LDN    Virtual Visit No    Medication changes reported     No    Fall or balance concerns reported    No    Warm-up and Cool-down Performed on first and last piece of equipment    Resistance Training Performed Yes    VAD Patient? No    PAD/SET Patient? No      Pain Assessment   Currently in Pain? No/denies                Social History   Tobacco Use  Smoking Status Former   Current packs/day: 0.00   Average packs/day: 2.0 packs/day for 40.0 years (80.0 ttl pk-yrs)   Types: Cigarettes   Start date: 10/09/1961   Quit date: 10/09/2001   Years since quitting: 21.8  Smokeless Tobacco Never    Goals Met:  Proper associated with RPD/PD & O2 Sat Independence with exercise equipment Exercise tolerated well No report of concerns or symptoms today  Goals Unmet:  Not Applicable  Comments: Pt able to follow exercise prescription today without complaint.  Will continue to monitor for progression.    Dr. Firman Hughes is Medical Director for [facility] Cardiac Rehabilitation.  Dr. Fuad Aleskerov is Medical Director for [facility] Pulmonary Rehabilitation.

## 2023-08-24 ENCOUNTER — Encounter: Admitting: *Deleted

## 2023-08-24 DIAGNOSIS — R0609 Other forms of dyspnea: Secondary | ICD-10-CM | POA: Diagnosis not present

## 2023-08-24 NOTE — Progress Notes (Signed)
 Daily Session Note  Patient Details  Name: Walter Knox MRN: [id number] Date of Birth: 07-01-1940 Referring Provider:   Flowsheet Row Pulmonary Rehab from 03/27/2023 in [facility] Cardiac and Pulmonary Rehab  Referring Provider Erskin Hearing, MD       Encounter Date: 08/24/2023  Check In:  Session Check In - 08/24/23 1002       Check-In   Supervising physician immediately available to respond to emergencies See telemetry face sheet for immediately available ER MD    Location [facility]-Cardiac & Pulmonary Rehab    Staff Present Maud Sorenson, RN, BSN, CCRP;Joseph Hood RCP,RRT,BSRT;Margaret Best, MS, Exercise Physiologist;Maxon Conetta BS, Exercise Physiologist    Virtual Visit No    Medication changes reported     No    Fall or balance concerns reported    No    Warm-up and Cool-down Performed on first and last piece of equipment    Resistance Training Performed Yes    VAD Patient? No    PAD/SET Patient? No      Pain Assessment   Currently in Pain? No/denies                Social History   Tobacco Use  Smoking Status Former   Current packs/day: 0.00   Average packs/day: 2.0 packs/day for 40.0 years (80.0 ttl pk-yrs)   Types: Cigarettes   Start date: 10/09/1961   Quit date: 10/09/2001   Years since quitting: 21.8  Smokeless Tobacco Never    Goals Met:  Proper associated with RPD/PD & O2 Sat Independence with exercise equipment Exercise tolerated well No report of concerns or symptoms today  Goals Unmet:  Not Applicable  Comments: Pt able to follow exercise prescription today without complaint.  Will continue to monitor for progression.    Dr. Firman Hughes is Medical Director for [facility] Cardiac Rehabilitation.  Dr. Fuad Aleskerov is Medical Director for [facility] Pulmonary Rehabilitation.

## 2023-08-29 ENCOUNTER — Encounter

## 2023-08-31 ENCOUNTER — Encounter

## 2023-09-05 ENCOUNTER — Encounter: Admitting: *Deleted

## 2023-09-05 DIAGNOSIS — R0609 Other forms of dyspnea: Secondary | ICD-10-CM | POA: Diagnosis not present

## 2023-09-05 NOTE — Progress Notes (Signed)
 Daily Session Note  Patient Details  Name: Walter Knox MRN: [id number] Date of Birth: [birth year] Referring Provider:   Flowsheet Row Pulmonary Rehab from 03/27/2023 in [facility] Cardiac and Pulmonary Rehab  Referring Provider Erskin Hearing, MD       Encounter Date: 09/05/2023  Check In:  Session Check In - 09/05/23 0935       Check-In   Supervising physician immediately available to respond to emergencies See telemetry face sheet for immediately available ER MD    Location [facility]-Cardiac & Pulmonary Rehab    Staff Present Maud Sorenson, RN, BSN, CCRP;Noah Tickle, BS, Exercise Physiologist;Maxon Conetta BS, Exercise Physiologist;Jason Martina Sledge RDN,LDN    Virtual Visit No    Medication changes reported     No    Fall or balance concerns reported    No    Warm-up and Cool-down Performed on first and last piece of equipment    Resistance Training Performed Yes    VAD Patient? No    PAD/SET Patient? No      Pain Assessment   Currently in Pain? No/denies                Social History   Tobacco Use  Smoking Status Former   Current packs/day: 0.00   Average packs/day: 2.0 packs/day for 40.0 years (80.0 ttl pk-yrs)   Types: Cigarettes   Start date: 10/09/1961   Quit date: 10/09/2001   Years since quitting: 21.9  Smokeless Tobacco Never    Goals Met:  Proper associated with RPD/PD & O2 Sat Independence with exercise equipment Exercise tolerated well No report of concerns or symptoms today  Goals Unmet:  Not Applicable  Comments: Pt able to follow exercise prescription today without complaint.  Will continue to monitor for progression.    Dr. Firman Hughes is Medical Director for [facility] Cardiac Rehabilitation.  Dr. Fuad Aleskerov is Medical Director for [facility] Pulmonary Rehabilitation.

## 2023-09-07 ENCOUNTER — Encounter: Admitting: *Deleted

## 2023-09-07 DIAGNOSIS — R0609 Other forms of dyspnea: Secondary | ICD-10-CM | POA: Diagnosis not present

## 2023-09-07 NOTE — Progress Notes (Signed)
 Discharge Note for  Walter Knox     Nov 01, 1940        Walter Knox graduated today from  rehab with 36 sessions completed.  Details of the patient's exercise prescription and what He needs to do in order to continue the prescription and progress were discussed with patient.  Patient was given a copy of prescription and goals.  Patient verbalized understanding. Walter Knox plans to continue to exercise by joining a gym.    6 Minute Walk     Row Name 03/27/23 1640 08/08/23 0936       6 Minute Walk   Phase Initial Discharge    Distance 1185 feet 1420 feet    Distance % Change -- 19.83 %    Distance Feet Change -- 235 ft    Walk Time 6 minutes 6 minutes    # of Rest Breaks 0 0    MPH 2.24 2.69    METS 1.45 1.78    RPE 5.07 15    Perceived Dyspnea  3 3    VO2 Peak 5.07 6.23    Symptoms Yes (comment) Yes (comment)    Comments SOB SOB    Resting HR 60 bpm 67 bpm    Resting BP 122/80 118/70    Resting Oxygen Saturation  93 % 95 %    Exercise Oxygen Saturation  during 6 min walk 94 % 94 %    Max Ex. HR 101 bpm 105 bpm    Max Ex. BP 154/76 140/74    2 Minute Post BP 126/76 138/74      Interval HR   1 Minute HR 96 90    2 Minute HR 101 86    3 Minute HR 80 97    4 Minute HR 84 99    5 Minute HR 84 97    6 Minute HR 90 105    2 Minute Post HR 60 91    Interval Heart Rate? Yes Yes      Interval Oxygen   Interval Oxygen? Yes Yes    Baseline Oxygen Saturation % 93 % 95 %    1 Minute Oxygen Saturation % 94 % 94 %    1 Minute Liters of Oxygen 0 L 0 L    2 Minute Oxygen Saturation % 95 % 95 %    2 Minute Liters of Oxygen 0 L 0 L    3 Minute Oxygen Saturation % 95 % 95 %    3 Minute Liters of Oxygen 0 L 0 L    4 Minute Oxygen Saturation % 96 % 95 %    4 Minute Liters of Oxygen 0 L 0 L    5 Minute Oxygen Saturation % 96 % 96 %    5 Minute Liters of Oxygen 0 L 0 L    6 Minute Oxygen Saturation % 96 % 97 %    6 Minute Liters of Oxygen 0 L 0 L    2 Minute Post Oxygen Saturation % 97 % 98 %     2 Minute Post Liters of Oxygen 0 L 0 L

## 2023-09-07 NOTE — Progress Notes (Signed)
 Pulmonary Individual Treatment Plan  Patient Details  Name: Walter Knox MRN: [id number] Date of Birth: [birth year] Referring Provider:   Flowsheet Row Pulmonary Rehab from 03/27/2023 in [facility] Cardiac and Pulmonary Rehab  Referring Provider Erskin Hearing, MD       Initial Encounter Date:  Flowsheet Row Pulmonary Rehab from 03/27/2023 in [facility] Cardiac and Pulmonary Rehab  Date 03/27/23       Visit Diagnosis: Dyspnea on exertion  Patient's Home Medications on Admission:  Current Outpatient Medications:    acetaminophen  (TYLENOL ) 500 MG tablet, Take 1,000 mg by mouth 2 (two) times daily., Disp: , Rfl:    amiodarone  (PACERONE ) 200 MG tablet, Take 200 mg by mouth in the morning., Disp: , Rfl:    cephALEXin  (KEFLEX ) 500 MG capsule, Take 1 capsule (500 mg total) by mouth 2 (two) times daily., Disp: 20 capsule, Rfl: 0   docusate sodium (COLACE) 100 MG capsule, Take 100 mg by mouth daily., Disp: , Rfl:    dutasteride  (AVODART ) 0.5 MG capsule, Take 0.5 mg by mouth daily., Disp: , Rfl:    ELIQUIS  5 MG TABS tablet, Take 5 mg by mouth 2 (two) times daily., Disp: , Rfl:    ezetimibe  (ZETIA ) 10 MG tablet, Take 10 mg by mouth at bedtime., Disp: , Rfl:    FARXIGA 10 MG TABS tablet, Take 10 mg by mouth daily., Disp: , Rfl:    Loratadine 10 MG CAPS, Take 10 mg by mouth daily., Disp: , Rfl:    losartan -hydrochlorothiazide  (HYZAAR) 50-12.5 MG tablet, Take 1 tablet by mouth daily. (Patient not taking: Reported on 07/25/2023), Disp: , Rfl:    lovastatin  (ALTOPREV ) 20 MG 24 hr tablet, Take 20 mg by mouth at bedtime., Disp: , Rfl:    metFORMIN (GLUCOPHAGE-XR) 500 MG 24 hr tablet, Take 500 mg by mouth daily with supper., Disp: , Rfl:    metoprolol  tartrate (LOPRESSOR ) 100 MG tablet, Take 100 mg by mouth 2 (two) times daily., Disp: , Rfl:    Multiple Vitamin (MULTIVITAMIN) tablet, Take 1 tablet by mouth daily., Disp: , Rfl:    omeprazole (PRILOSEC) 20 MG capsule, Take 20 mg by mouth at bedtime. , Disp: ,  Rfl:    PHENYLEPHRINE -GUAIFENESIN PO, Take 400 mg by mouth daily as needed (Congestion). Mucinex, Disp: , Rfl:    polycarbophil (FIBERCON) 625 MG tablet, Take 1,875 mg by mouth 2 (two) times daily., Disp: , Rfl:    sacubitril-valsartan (ENTRESTO) 24-26 MG, Take 1 tablet by mouth 2 (two) times daily., Disp: , Rfl:    torsemide (DEMADEX) 20 MG tablet, Take 20 mg by mouth daily., Disp: , Rfl:   Past Medical History: Past Medical History:  Diagnosis Date   Anginal pain (HCC)    BPH (benign prostatic hyperplasia)    CAD (coronary artery disease)    Cancer (HCC)    Chronic back pain    Dyspnea    Dysrhythmia    Environmental and seasonal allergies    Hyperlipidemia    Hypertension    Lower extremity edema    Morbid obesity (HCC)    Pre-diabetes    PVD (peripheral vascular disease) (HCC)    SSS (sick sinus syndrome) (HCC)     Tobacco Use: Social History   Tobacco Use  Smoking Status Former   Current packs/day: 0.00   Average packs/day: 2.0 packs/day for 40.0 years (80.0 ttl pk-yrs)   Types: Cigarettes   Start date: 10/09/1961   Quit date: 10/09/2001   Years since quitting: 21.9  Smokeless Tobacco Never    Labs: Review Flowsheet        No data to display           Pulmonary Assessment Scores:  Pulmonary Assessment Scores     Row Name 03/27/23 1647 04/04/23 0927 08/08/23 0942     ADL UCSD   ADL Phase -- Entry --   SOB Score total -- 32 --   Rest -- 0 --   Walk -- 0  Starts at 0 and becomes 4 --   Stairs -- 4 --   Bath -- 0 --   Dress -- 0 --   Shop -- 1 --     CAT Score   CAT Score 10 -- --     mMRC Score   mMRC Score 3 -- 1    Row Name 09/05/23 1001         ADL UCSD   ADL Phase Exit     SOB Score total 25     Rest 0     Walk 2     Stairs 2     Bath 0     Dress 0     Shop 1       CAT Score   CAT Score 7              UCSD: Self-administered rating of dyspnea associated with activities of daily living (ADLs) 6-point scale (0 = "not at  all" to 5 = "maximal or unable to do because of breathlessness")  Scoring Scores range from 0 to 120.  Minimally important difference is 5 units  CAT: CAT can identify the health impairment of COPD patients and is better correlated with disease progression.  CAT has a scoring range of zero to 40. The CAT score is classified into four groups of low (less than 10), medium (10 - 20), high (21-30) and very high (31-40) based on the impact level of disease on health status. A CAT score over 10 suggests significant symptoms.  A worsening CAT score could be explained by an exacerbation, poor medication adherence, poor inhaler technique, or progression of COPD or comorbid conditions.  CAT MCID is 2 points  mMRC: mMRC (Modified Medical Research Council) Dyspnea Scale is used to assess the degree of baseline functional disability in patients of respiratory disease due to dyspnea. No minimal important difference is established. A decrease in score of 1 point or greater is considered a positive change.   Pulmonary Function Assessment:   Exercise Target Goals: Exercise Program Goal: Individual exercise prescription set using results from initial 6 min walk test and THRR while considering  patient's activity barriers and safety.   Exercise Prescription Goal: Initial exercise prescription builds to 30-45 minutes a day of aerobic activity, 2-3 days per week.  Home exercise guidelines will be given to patient during program as part of exercise prescription that the participant will acknowledge.  Education: Aerobic Exercise: - Group verbal and visual presentation on the components of exercise prescription. Introduces F.I.T.T principle from ACSM for exercise prescriptions.  Reviews F.I.T.T. principles of aerobic exercise including progression. Written material given at graduation.   Education: Resistance Exercise: - Group verbal and visual presentation on the components of exercise prescription. Introduces  F.I.T.T principle from ACSM for exercise prescriptions  Reviews F.I.T.T. principles of resistance exercise including progression. Written material given at graduation. Flowsheet Row Pulmonary Rehab from 05/11/2023 in [facility] Cardiac and Pulmonary Rehab  Date 04/13/23  Educator MB  Instruction Review Code  1- Verbalizes Understanding        Education: Exercise & Equipment Safety: - Individual verbal instruction and demonstration of equipment use and safety with use of the equipment. Flowsheet Row Pulmonary Rehab from 05/11/2023 in [facility] Cardiac and Pulmonary Rehab  Date 03/27/23  Educator MB  Instruction Review Code 1- Verbalizes Understanding       Education: Exercise Physiology & General Exercise Guidelines: - Group verbal and written instruction with models to review the exercise physiology of the cardiovascular system and associated critical values. Provides general exercise guidelines with specific guidelines to those with heart or lung disease.    Education: Flexibility, Balance, Mind/Body Relaxation: - Group verbal and visual presentation with interactive activity on the components of exercise prescription. Introduces F.I.T.T principle from ACSM for exercise prescriptions. Reviews F.I.T.T. principles of flexibility and balance exercise training including progression. Also discusses the mind body connection.  Reviews various relaxation techniques to help reduce and manage stress (i.e. Deep breathing, progressive muscle relaxation, and visualization). Balance handout provided to take home. Written material given at graduation. Flowsheet Row Pulmonary Rehab from 05/11/2023 in [facility] Cardiac and Pulmonary Rehab  Date 04/13/23  Educator MB  Instruction Review Code 1- Verbalizes Understanding       Activity Barriers & Risk Stratification:  Activity Barriers & Cardiac Risk Stratification - 03/27/23 1641       Activity Barriers & Cardiac Risk Stratification   Activity Barriers Left Knee  Replacement;Right Knee Replacement;Deconditioning             6 Minute Walk:  6 Minute Walk     Row Name 03/27/23 1640 08/08/23 0936       6 Minute Walk   Phase Initial Discharge    Distance 1185 feet 1420 feet    Distance % Change -- 19.83 %    Distance Feet Change -- 235 ft    Walk Time 6 minutes 6 minutes    # of Rest Breaks 0 0    MPH 2.24 2.69    METS 1.45 1.78    RPE 5.07 15    Perceived Dyspnea  3 3    VO2 Peak 5.07 6.23    Symptoms Yes (comment) Yes (comment)    Comments SOB SOB    Resting HR 60 bpm 67 bpm    Resting BP 122/80 118/70    Resting Oxygen Saturation  93 % 95 %    Exercise Oxygen Saturation  during 6 min walk 94 % 94 %    Max Ex. HR 101 bpm 105 bpm    Max Ex. BP 154/76 140/74    2 Minute Post BP 126/76 138/74      Interval HR   1 Minute HR 96 90    2 Minute HR 101 86    3 Minute HR 80 97    4 Minute HR 84 99    5 Minute HR 84 97    6 Minute HR 90 105    2 Minute Post HR 60 91    Interval Heart Rate? Yes Yes      Interval Oxygen   Interval Oxygen? Yes Yes    Baseline Oxygen Saturation % 93 % 95 %    1 Minute Oxygen Saturation % 94 % 94 %    1 Minute Liters of Oxygen 0 L 0 L    2 Minute Oxygen Saturation % 95 % 95 %    2 Minute Liters of Oxygen 0 L 0 L    3  Minute Oxygen Saturation % 95 % 95 %    3 Minute Liters of Oxygen 0 L 0 L    4 Minute Oxygen Saturation % 96 % 95 %    4 Minute Liters of Oxygen 0 L 0 L    5 Minute Oxygen Saturation % 96 % 96 %    5 Minute Liters of Oxygen 0 L 0 L    6 Minute Oxygen Saturation % 96 % 97 %    6 Minute Liters of Oxygen 0 L 0 L    2 Minute Post Oxygen Saturation % 97 % 98 %    2 Minute Post Liters of Oxygen 0 L 0 L            Oxygen Initial Assessment:  Oxygen Initial Assessment - 03/16/23 1431       Home Oxygen   Home Oxygen Device None    Sleep Oxygen Prescription None    Home Exercise Oxygen Prescription None    Home Resting Oxygen Prescription None    Compliance with Home Oxygen Use  Yes      Intervention   Short Term Goals To learn and understand importance of monitoring SPO2 with pulse oximeter and demonstrate accurate use of the pulse oximeter.;To learn and understand importance of maintaining oxygen saturations>88%;To learn and demonstrate proper pursed lip breathing techniques or other breathing techniques. ;To learn and demonstrate proper use of respiratory medications    Long  Term Goals Verbalizes importance of monitoring SPO2 with pulse oximeter and return demonstration;Maintenance of O2 saturations>88%;Exhibits proper breathing techniques, such as pursed lip breathing or other method taught during program session;Compliance with respiratory medication;Demonstrates proper use of MDI's             Oxygen Re-Evaluation:  Oxygen Re-Evaluation     Row Name 04/04/23 0934 05/09/23 0933 07/06/23 0953 09/05/23 0959       Program Oxygen Prescription   Program Oxygen Prescription None None None None      Home Oxygen   Home Oxygen Device None None None None    Sleep Oxygen Prescription None None None None    Home Exercise Oxygen Prescription None None None None    Home Resting Oxygen Prescription None None None --    Compliance with Home Oxygen Use Yes Yes Yes Yes      Goals/Expected Outcomes   Short Term Goals To learn and demonstrate proper pursed lip breathing techniques or other breathing techniques.  To learn and demonstrate proper pursed lip breathing techniques or other breathing techniques.  To learn and demonstrate proper pursed lip breathing techniques or other breathing techniques.  To learn and demonstrate proper pursed lip breathing techniques or other breathing techniques.     Long  Term Goals Exhibits proper breathing techniques, such as pursed lip breathing or other method taught during program session Exhibits proper breathing techniques, such as pursed lip breathing or other method taught during program session Exhibits proper breathing techniques,  such as pursed lip breathing or other method taught during program session Exhibits proper breathing techniques, such as pursed lip breathing or other method taught during program session    Comments Reviewed PLB technique with pt.  Talked about how it works and it's importance in maintaining their exercise saturations. Reviewed PLB technique with pt.  Talked about how it works and it's importance in maintaining their exercise saturations. Reviewed PLB with Ed and he verbalized understanding DIscharge soon  continues to use breathing techniqyes as needed  Goals/Expected Outcomes Short: Become more profiecient at using PLB. Long: Become independent at using PLB. Short: Become more profiecient at using PLB. Long: Become independent at using PLB. STG: Become more profiecient at using PLB. LTG: Become independent at using PLB. To continue to use breathing techniques as needed after discharge             Oxygen Discharge (Final Oxygen Re-Evaluation):  Oxygen Re-Evaluation - 09/05/23 0959       Program Oxygen Prescription   Program Oxygen Prescription None      Home Oxygen   Home Oxygen Device None    Sleep Oxygen Prescription None    Home Exercise Oxygen Prescription None    Compliance with Home Oxygen Use Yes      Goals/Expected Outcomes   Short Term Goals To learn and demonstrate proper pursed lip breathing techniques or other breathing techniques.     Long  Term Goals Exhibits proper breathing techniques, such as pursed lip breathing or other method taught during program session    Comments DIscharge soon  continues to use breathing techniqyes as needed    Goals/Expected Outcomes To continue to use breathing techniques as needed after discharge             Initial Exercise Prescription:  Initial Exercise Prescription - 03/27/23 1600       Date of Initial Exercise RX and Referring Provider   Date 03/27/23    Referring Provider Erskin Hearing, MD      Oxygen   Maintain  Oxygen Saturation 88% or higher      Treadmill   MPH 2    Grade 0    Minutes 15    METs 2.53      Recumbant Bike   Level 1    RPM 50    Watts 15    Minutes 15    METs 1.45      NuStep   Level 1    SPM 80    Minutes 15    METs 1.45      Track   Laps 28    Minutes 15    METs 2.52      Prescription Details   Frequency (times per week) 2    Duration Progress to 30 minutes of continuous aerobic without signs/symptoms of physical distress      Intensity   THRR 40-80% of Max Heartrate 90-121    Ratings of Perceived Exertion 11-13    Perceived Dyspnea 0-4      Progression   Progression Continue to progress workloads to maintain intensity without signs/symptoms of physical distress.      Resistance Training   Training Prescription Yes    Weight 7 lb    Reps 10-15             Perform Capillary Blood Glucose checks as needed.  Exercise Prescription Changes:   Exercise Prescription Changes     Row Name 03/27/23 1600 04/19/23 1300 05/02/23 1000 05/03/23 1400 05/16/23 0700     Response to Exercise   Blood Pressure (Admit) 122/80 120/80 120/80 104/60 126/74   Blood Pressure (Exercise) 154/76 142/68 142/68 128/70 126/88   Blood Pressure (Exit) 126/76 106/62 106/62 102/64 98/58   Heart Rate (Admit) 60 bpm 60 bpm 60 bpm 79 bpm 83 bpm   Heart Rate (Exercise) 101 bpm 95 bpm 95 bpm 107 bpm 108 bpm   Heart Rate (Exit) 60 bpm 81 bpm 81 bpm 85 bpm 78 bpm   Oxygen Saturation (  Admit) 93 % 93 % 93 % 96 % 95 %   Oxygen Saturation (Exercise) 94 % 93 % 93 % 95 % 93 %   Oxygen Saturation (Exit) 97 % 96 % 96 % 97 % 93 %   Rating of Perceived Exertion (Exercise) 15 15 15 14 14    Perceived Dyspnea (Exercise) 3 2 2 3  --   Symptoms SOB -- -- none none   Comments results -- -- -- --   Duration Progress to 30 minutes of  aerobic without signs/symptoms of physical distress Progress to 30 minutes of  aerobic without signs/symptoms of physical distress Progress to 30 minutes of   aerobic without signs/symptoms of physical distress Progress to 30 minutes of  aerobic without signs/symptoms of physical distress Progress to 30 minutes of  aerobic without signs/symptoms of physical distress   Intensity THRR New THRR unchanged THRR unchanged THRR unchanged THRR unchanged     Progression   Progression Continue to progress workloads to maintain intensity without signs/symptoms of physical distress. Continue to progress workloads to maintain intensity without signs/symptoms of physical distress. Continue to progress workloads to maintain intensity without signs/symptoms of physical distress. Continue to progress workloads to maintain intensity without signs/symptoms of physical distress. Continue to progress workloads to maintain intensity without signs/symptoms of physical distress.   Average METs 1.45 3.5 3.5 3.66 3.98     Resistance Training   Training Prescription -- Yes Yes Yes Yes   Weight -- 7 lb 7 lb 7 lb 7 lb   Reps -- 10-15 10-15 10-15 10-15     Interval Training   Interval Training -- No No No No     Treadmill   MPH -- 2 2 2.2 2.8   Grade -- 0 0 0 0   Minutes -- 15 15 15 15    METs -- 2.53 2.53 2.69 3.14     NuStep   Level -- 4 4 5 5    Minutes -- 15 15 15 15    METs -- 5.6 5.6 5.2 5.8     Home Exercise Plan   Plans to continue exercise at -- -- [employer] (comment)  Looking at a few different gyms ([place], [employer], or [employer]) to do aerobic and resistance exercise. Will also golf some. [employer] (comment)  Looking at a few different gyms ([place], [employer], or [employer]) to do aerobic and resistance exercise. Will also golf some. [employer] (comment)  Looking at a few different gyms ([place], [employer], or [employer]) to do aerobic and resistance exercise. Will also golf some.   Frequency -- -- Add 3 additional days to program exercise sessions. Add 3 additional days to program exercise sessions. Add 3 additional days to program exercise sessions.   Initial  Home Exercises Provided -- -- 05/02/23 05/02/23 05/02/23     Oxygen   Maintain Oxygen Saturation -- 88% or higher 88% or higher 88% or higher 88% or higher    Row Name 06/01/23 1700 06/13/23 1500 06/26/23 1400 07/13/23 1600 08/10/23 1100     Response to Exercise   Blood Pressure (Admit) 118/68 100/74 108/70 104/70 122/70   Blood Pressure (Exercise) 122/64 -- -- -- --   Blood Pressure (Exit) 126/70 104/58 110/68 104/62 98/58   Heart Rate (Admit) 71 bpm 60 bpm 75 bpm 60 bpm 93 bpm   Heart Rate (Exercise) 115 bpm 80 bpm 102 bpm 115 bpm 127 bpm   Heart Rate (Exit) 88 bpm -- 83 bpm 62 bpm 84 bpm  Oxygen Saturation (Admit) 97 % 96 % 94 % 98 % 96 %   Oxygen Saturation (Exercise) 93 % 93 % 92 % 92 % 93 %   Oxygen Saturation (Exit) 97 % 97 % 94 % 94 % 92 %   Rating of Perceived Exertion (Exercise) 16 16 16 15 15    Perceived Dyspnea (Exercise) 3 3 3 3 3    Symptoms none none none none none   Duration Progress to 30 minutes of  aerobic without signs/symptoms of physical distress Continue with 30 min of aerobic exercise without signs/symptoms of physical distress. Continue with 30 min of aerobic exercise without signs/symptoms of physical distress. Continue with 30 min of aerobic exercise without signs/symptoms of physical distress. Continue with 30 min of aerobic exercise without signs/symptoms of physical distress.   Intensity THRR unchanged THRR unchanged THRR unchanged THRR unchanged THRR unchanged     Progression   Progression Continue to progress workloads to maintain intensity without signs/symptoms of physical distress. Continue to progress workloads to maintain intensity without signs/symptoms of physical distress. Continue to progress workloads to maintain intensity without signs/symptoms of physical distress. Continue to progress workloads to maintain intensity without signs/symptoms of physical distress. Continue to progress workloads to maintain intensity without signs/symptoms of physical  distress.   Average METs 3.9 3.22 3.34 3.96 4.23     Resistance Training   Training Prescription Yes Yes Yes Yes Yes   Weight 7 lb 7 lb 7 lb 7 lb 10 lb   Reps 10-15 10-15 10-15 10-15 10-15     Interval Training   Interval Training No No No No No     Treadmill   MPH 2.8 2.8 -- -- --   Grade 0 0 -- -- --   Minutes 15 15 -- -- --   METs 3.14 3.14 -- -- --     NuStep   Level 6 -- -- -- 5   Minutes 15 -- -- -- 15   METs 6.5 -- -- -- 3.4     REL-XR   Level -- -- 8 9 10    Minutes -- -- 15 15 15    METs -- -- 5.4 9.3 7     T5 Nustep   Level -- 5 5 5 5    SPM -- 80 -- -- --   Minutes -- 15 15 15 15    METs -- 3.3 3.6 3.7 3.4     Track   Laps -- -- 30 31 42   Minutes -- -- 15 15 15    METs -- -- 2.63 2.69 3.28     Home Exercise Plan   Plans to continue exercise at [employer] (comment)  Looking at a few different gyms ([place], [employer], or [employer]) to do aerobic and resistance exercise. Will also golf some. -- [employer] (comment)  Looking at a few different gyms ([place], [employer], or [employer]) to do aerobic and resistance exercise. Will also golf some. [employer] (comment)  Looking at a few different gyms ([place], [employer], or [employer]) to do aerobic and resistance exercise. Will also golf some. [employer] (comment)  Looking at a few different gyms ([place], [employer], or [employer]) to do aerobic and resistance exercise. Will also golf some.   Frequency Add 3 additional days to program exercise sessions. -- Add 3 additional days to program exercise sessions. Add 3 additional days to program exercise sessions. Add 3 additional days to program exercise sessions.   Initial Home Exercises Provided 05/02/23 -- 05/02/23 05/02/23 05/02/23  Oxygen   Maintain Oxygen Saturation 88% or higher -- 88% or higher 88% or higher 88% or higher    Row Name 08/24/23 1500             Response to Exercise   Blood Pressure (Admit) 122/58       Blood Pressure (Exit) 104/58       Heart  Rate (Admit) 86 bpm       Heart Rate (Exercise) 93 bpm       Heart Rate (Exit) 80 bpm       Oxygen Saturation (Admit) 95 %       Oxygen Saturation (Exercise) 93 %       Oxygen Saturation (Exit) 94 %       Rating of Perceived Exertion (Exercise) 16       Perceived Dyspnea (Exercise) 3       Symptoms none       Duration Continue with 30 min of aerobic exercise without signs/symptoms of physical distress.       Intensity THRR unchanged         Progression   Progression Continue to progress workloads to maintain intensity without signs/symptoms of physical distress.       Average METs 4.32         Resistance Training   Training Prescription Yes       Weight 10 lb       Reps 10-15         Interval Training   Interval Training No         NuStep   Level 5       Minutes 15       METs 5         REL-XR   Level 10       Minutes 15       METs 6.4         Track   Laps 38       Minutes 15       METs 3.07         Home Exercise Plan   Plans to continue exercise at [employer] (comment)  Looking at a few different gyms ([place], [employer], or [employer]) to do aerobic and resistance exercise. Will also golf some.       Frequency Add 3 additional days to program exercise sessions.       Initial Home Exercises Provided 05/02/23         Oxygen   Maintain Oxygen Saturation 88% or higher                Exercise Comments:   Exercise Comments     Row Name 04/04/23 1308 09/07/23 0954         Exercise Comments First full day of exercise!  Patient was oriented to gym and equipment including functions, settings, policies, and procedures.  Patient's individual exercise prescription and treatment plan were reviewed.  All starting workloads were established based on the results of the 6 minute walk test done at initial orientation visit.  The plan for exercise progression was also introduced and progression will be customized based on patient's performance and goals. Douglas graduated  today from  rehab with 36 sessions completed.  Details of the patient's exercise prescription and what He needs to do in order to continue the prescription and progress were discussed with patient.  Patient was given a copy of prescription and goals.  Patient verbalized understanding. Zhi plans to continue to  exercise by joining a gym.               Exercise Goals and Review:   Exercise Goals     Row Name 03/27/23 1646             Exercise Goals   Increase Physical Activity Yes       Intervention Provide advice, education, support and counseling about physical activity/exercise needs.;Develop an individualized exercise prescription for aerobic and resistive training based on initial evaluation findings, risk stratification, comorbidities and participant's personal goals.       Expected Outcomes Short Term: Attend rehab on a regular basis to increase amount of physical activity.;Long Term: Add in home exercise to make exercise part of routine and to increase amount of physical activity.;Long Term: Exercising regularly at least 3-5 days a week.       Increase Strength and Stamina Yes       Intervention Provide advice, education, support and counseling about physical activity/exercise needs.;Develop an individualized exercise prescription for aerobic and resistive training based on initial evaluation findings, risk stratification, comorbidities and participant's personal goals.       Expected Outcomes Short Term: Increase workloads from initial exercise prescription for resistance, speed, and METs.;Short Term: Perform resistance training exercises routinely during rehab and add in resistance training at home;Long Term: Improve cardiorespiratory fitness, muscular endurance and strength as measured by increased METs and functional capacity ( )       Able to understand and use rate of perceived exertion (RPE) scale Yes       Intervention Provide education and explanation on how to use RPE  scale       Expected Outcomes Short Term: Able to use RPE daily in rehab to express subjective intensity level;Long Term:  Able to use RPE to guide intensity level when exercising independently       Able to understand and use Dyspnea scale Yes       Intervention Provide education and explanation on how to use Dyspnea scale       Expected Outcomes Short Term: Able to use Dyspnea scale daily in rehab to express subjective sense of shortness of breath during exertion;Long Term: Able to use Dyspnea scale to guide intensity level when exercising independently       Knowledge and understanding of Target Heart Rate Range (THRR) Yes       Intervention Provide education and explanation of THRR including how the numbers were predicted and where they are located for reference       Expected Outcomes Short Term: Able to state/look up THRR;Short Term: Able to use daily as guideline for intensity in rehab;Long Term: Able to use THRR to govern intensity when exercising independently       Able to check pulse independently Yes       Intervention Provide education and demonstration on how to check pulse in carotid and radial arteries.;Review the importance of being able to check your own pulse for safety during independent exercise       Expected Outcomes Short Term: Able to explain why pulse checking is important during independent exercise;Long Term: Able to check pulse independently and accurately       Understanding of Exercise Prescription Yes       Intervention Provide education, explanation, and written materials on patient's individual exercise prescription       Expected Outcomes Short Term: Able to explain program exercise prescription;Long Term: Able to explain home exercise prescription to exercise independently  Exercise Goals Re-Evaluation :  Exercise Goals Re-Evaluation     Row Name 04/04/23 0935 04/19/23 1357 05/02/23 1033 05/03/23 1427 05/16/23 0757     Exercise Goal  Re-Evaluation   Exercise Goals Review Able to understand and use rate of perceived exertion (RPE) scale;Knowledge and understanding of Target Heart Rate Range (THRR);Understanding of Exercise Prescription;Able to understand and use Dyspnea scale Increase Physical Activity;Increase Strength and Stamina;Understanding of Exercise Prescription Able to check pulse independently;Able to understand and use Dyspnea scale;Increase Strength and Stamina;Understanding of Exercise Prescription;Knowledge and understanding of Target Heart Rate Range (THRR);Able to understand and use rate of perceived exertion (RPE) scale;Increase Physical Activity Increase Physical Activity;Increase Strength and Stamina;Understanding of Exercise Prescription Increase Physical Activity;Increase Strength and Stamina;Understanding of Exercise Prescription   Comments Reviewed RPE and dyspnea scale, THR and program prescription with pt today.  Pt voiced understanding and was given a copy of goals to take home. Ed is off to a great start in the program. He has attended his first 4 sessions of the program, and has been able to increase to level 4 on the T4 nustep. He is also off to a great start on the treadmill at a workload of and no incline. We will continue to monitor his progress in the program. Reviewed home exercise with pt today from 9:37am to 10:00am.  Pt plans to go to a gym facility ([employer], [employer], or [employer]) for aerobic and resistance exercise 3 additional days for exercise. He also will golf 2 days a week when the weather is appropriate. Reviewed THR, pulse, RPE, sign and symptoms, pulse oximetery and when to call 911 or MD.  Also discussed weather considerations and indoor options.  Pt voiced understanding. Ed continues to do well in rehab. He was recently able to increase his speed on the treadmill from to 2.98mph. He was also able to increase his level on the T4 nustep from level 4 to 5. We will continue to monitor his progress  in the program. Ed is doing well in the program. He has recently been able to increase his speed on the treadmill from 2.2 to 2.90mph. He was also able to maintain an intensity of level 5 on the T4 nustep. We will continue to monitor his progress in the program.   Expected Outcomes Short: Use RPE daily to regulate intensity. Long: Follow program prescription in THR. Short: Continue to dollow exercise prescription and increase workloads when able. Long: Continue exercise to improve strength and stamina. Short: Add 3 additional days at home for exercise. Long: Continue to exercise independently for 5 days a week to improve strength and stamina. Short: Continue to increase treadmill workloads. Long: Continue exercise to increase strength and stamina. Short: Continue to increase treadmill workloads. Long: Continue exercise to increase strength and stamina.    Row Name 06/01/23 1746 06/13/23 1512 06/26/23 1422 07/06/23 0950 07/13/23 1613     Exercise Goal Re-Evaluation   Exercise Goals Review Increase Physical Activity;Increase Strength and Stamina;Understanding of Exercise Prescription Increase Physical Activity;Increase Strength and Stamina;Understanding of Exercise Prescription Increase Physical Activity;Increase Strength and Stamina;Understanding of Exercise Prescription Increase Physical Activity;Increase Strength and Stamina;Understanding of Exercise Prescription Increase Physical Activity;Increase Strength and Stamina;Understanding of Exercise Prescription   Comments Ed is doing well in rehab. He was able to increase his level on the T4 nustep from 5 to 6. He was also able to maintain his speed of 2. on the treadmill. We will continue to monitor his progress in the program. Ed  returned from his cruise/trip and completed 1 session in this review. He was able to maintain his speed of 2. on the treadmill and worked at level 5 on the T5 nustep as he gets back to the program. We will continue to monitor  his progress in the program. Ed continues to do well in rehab. He started walking on the track instead of the treadmill as it is a higher intensity for him and was able to do 30 laps in this review. He also did level 8 on the XR and maintained level 5 on the T5 nustep. We will continue to monitor his progress in the program. Ed is doign well at rehab. He is walking on the track today. says he is unusally tired, says he didnt sleep as well as he usually does. Commended him for still coming to rehab and reminded him to take breaks if needed. Encouraged him to increase workload as he is able. He reports he walks on days he doesnt attent rehab. Ed continues to make improvements in the program. He was able to increase his laps on the track from 30 to 31 in 15 minutes. He was also able to increase from level 8 to 9 on the XR. We will continue to monitor his progress in the program.   Expected Outcomes Short: Continue to increase treadmill workloads. Long: Continue exercise to increase strength and stamina. Short: Continue to progressively increase treadmill and T5 nustep workloads. Long: Continue exercise to improve strength and stamina. Short: Push for more laps on the track. Long: Continue exercise to improve strength and stamina STG: increase workload as able. LTG: Continue to exercise to improve strength and stamina Short: Continue to increase track laps. Long: Continue exercise to improve strength and stamina.    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 He plans to return the week of 4/14. Ed continues to do well in rehab. He is due for his post this week and hopes to improve. He increased his laps on the track to 42 laps. He also increased to level 10 on the XR and increased his handweights to 10lbs for resistance. We will continue to monitor his progress in the program. Ed is doing well in rehab and is close to graduating from the program. He recently completed his post and improved by 19.83%. He also continues to work at level 10 on the XR and level 5 on the T4 nustep. We will continue to monitor his progress until he graduates from the program.     Expected Outcomes Short: Return to rehab when cleared. Long: Continue exercise to improve strength and stamina. Short: Improve on post . Long: Continue to increase overall METs and stamina. --              Discharge Exercise Prescription (Final Exercise Prescription Changes):  Exercise Prescription Changes - 08/24/23 1500       Response to Exercise   Blood Pressure (Admit) 122/58    Blood Pressure (Exit) 104/58    Heart Rate (Admit) 86 bpm    Heart Rate (Exercise) 93  bpm    Heart Rate (Exit) 80 bpm    Oxygen Saturation (Admit) 95 %    Oxygen Saturation (Exercise) 93 %    Oxygen Saturation (Exit) 94 %    Rating of Perceived Exertion (Exercise) 16    Perceived Dyspnea (Exercise) 3    Symptoms none    Duration Continue with 30 min of aerobic exercise without signs/symptoms of physical distress.    Intensity THRR unchanged      Progression   Progression Continue to progress workloads to maintain intensity without signs/symptoms of physical distress.    Average METs 4.32      Resistance Training   Training Prescription Yes    Weight 10 lb    Reps 10-15      Interval Training   Interval Training No      NuStep   Level 5    Minutes 15    METs 5      REL-XR   Level 10    Minutes 15    METs 6.4      Track   Laps 38    Minutes 15    METs 3.07       Home Exercise Plan   Plans to continue exercise at [employer] (comment)   Looking at a few different gyms ([place], [employer], or [employer]) to do aerobic and resistance exercise. Will also golf some.   Frequency Add 3 additional days to program exercise sessions.    Initial Home Exercises Provided 05/02/23      Oxygen   Maintain Oxygen Saturation 88% or higher             Nutrition:  Target Goals: Understanding of nutrition guidelines, daily intake of sodium 1500mg , cholesterol 200mg , calories 30% from fat and 7% or less from saturated fats, daily to have 5 or more servings of fruits and vegetables.  Education: All About Nutrition: -Group instruction provided by verbal, written material, interactive activities, discussions, models, and posters to present general guidelines for heart healthy nutrition including fat, fiber, MyPlate, the role of sodium in heart healthy nutrition, utilization of the nutrition label, and utilization of this knowledge for meal planning. Follow up email sent as well. Written material given at graduation.   Biometrics:  Pre Biometrics - 03/27/23 1647       Pre Biometrics   Height 5' 9.2" (1.758 m)    Weight 270 lb 11.2 oz (122.8 kg)    Waist Circumference 56.5 inches    Hip Circumference 55 inches    Waist to Hip Ratio 1.03 %    BMI (Calculated) 39.73    Single Leg Stand 4.5 seconds             Post Biometrics - 08/08/23 0941        Post  Biometrics   Height 5' 9.2" (1.758 m)    Weight 272 lb 3.2 oz (123.5 kg)    Waist Circumference 55 inches    Hip Circumference 52.8 inches    Waist to Hip Ratio 1.04 %    BMI (Calculated) 39.95    Single Leg Stand 6 seconds             Nutrition Therapy Plan and Nutrition Goals:  Nutrition Therapy & Goals - 03/27/23 1650       Nutrition Therapy   RD appointment deferred Yes      Personal Nutrition Goals   Nutrition Goal RD appointment deferred, has another nutrition plan in place  Intervention Plan   Intervention Prescribe, educate and counsel regarding individualized specific dietary modifications aiming towards targeted core components such as weight, hypertension, lipid management, diabetes, heart failure and other comorbidities.;Nutrition handout(s) given to patient.    Expected Outcomes Short Term Goal: Understand basic principles of dietary content, such as calories, fat, sodium, cholesterol and nutrients.;Short Term Goal: A plan has been developed with personal nutrition goals set during dietitian appointment.;Long Term Goal: Adherence to prescribed nutrition plan.             Nutrition Assessments:  MEDIFICTS Score Key: >=70 Need to make dietary changes  40-70 Heart Healthy Diet <= 40 Therapeutic Level Cholesterol Diet  Flowsheet Row Pulmonary Rehab from 09/05/2023 in [facility]  Picture Your Plate Total Score on Discharge 56      Picture Your Plate Scores: <16 Unhealthy dietary pattern with much room for improvement. 41-50 Dietary pattern unlikely to meet recommendations for good health and room for improvement. 51-60 More healthful dietary pattern, with some room for improvement.  >60 Healthy dietary pattern, although there may be some specific behaviors that could be improved.   Nutrition Goals Re-Evaluation:  Nutrition Goals Re-Evaluation     Row Name 04/20/23 [phone] 05/09/23 0926 07/06/23 0956 08/03/23 0955       Goals   Current Weight 278 lb (126.1 kg) 272 lb 4.8 oz (123.5 kg) -- 275 lb (124.7 kg)    Nutrition Goal -- RD appointment deferred, has another nutrition plan in place -- --    Comment Patient was informed on why it is important to maintain a balanced diet when dealing with Respiratory issues. Explained that it takes a lot of energy to breath and when they are short of breath often they will need to have a good diet to help keep up with the calories they are expending for breathing. Patient was informed on why it  is important to maintain a balanced diet when dealing with Respiratory issues. Ed says that he is still maintaining his diet in order to have enough energy for exercise and manage his weight. Pt Deferred RD appointment Pt Deferred RD appointment    Expected Outcome Short: Choose and plan snacks accordingly to patients caloric intake to improve breathing. Long: Maintain a diet independently that meets their caloric intake to aid in daily shortness of breath. Short: Choose and plan snacks accordingly to patients caloric intake to improve breathing. Long: Maintain a diet independently that meets their caloric intake to aid in daily shortness of breath. -- --             Nutrition Goals Discharge (Final Nutrition Goals Re-Evaluation):  Nutrition Goals Re-Evaluation - 08/03/23 0955       Goals   Current Weight 275 lb (124.7 kg)    Comment Pt Deferred RD appointment             Psychosocial: Target Goals: Acknowledge presence or absence of significant depression and/or stress, maximize coping skills, provide positive support system. Participant is able to verbalize types and ability to use techniques and skills needed for reducing stress and depression.   Education: Stress, Anxiety, and Depression - Group verbal and visual presentation to define topics covered.  Reviews how body is impacted by stress, anxiety, and depression.  Also discusses healthy ways to reduce stress and to treat/manage anxiety and depression.  Written material given at graduation.   Education: Sleep Hygiene -Provides group verbal and written instruction about how sleep can affect your health.  Define sleep hygiene, discuss sleep cycles and impact of sleep habits. Review good sleep hygiene tips.    Initial Review & Psychosocial Screening:  Initial Psych Review & Screening - 03/16/23 1443       Initial Review   Current issues with None Identified      Family Dynamics   Good Support System? Yes   wife      Barriers   Psychosocial barriers to participate in program There are no identifiable barriers or psychosocial needs.;The patient should benefit from training in stress management and relaxation.      Screening Interventions   Interventions Encouraged to exercise;Provide feedback about the scores to participant;To provide support and resources with identified psychosocial needs    Expected Outcomes Short Term goal: Utilizing psychosocial counselor, staff and physician to assist with identification of specific Stressors or current issues interfering with healing process. Setting desired goal for each stressor or current issue identified.;Long Term Goal: Stressors or current issues are controlled or eliminated.;Short Term goal: Identification and review with participant of any Quality of Life or Depression concerns found by scoring the questionnaire.;Long Term goal: The participant improves quality of Life and PHQ9 Scores as seen by post scores and/or verbalization of changes             Quality of Life Scores:  Scores of 19 and below usually indicate a poorer quality of life in these areas.  A difference of  2-3 points is a clinically meaningful difference.  A difference of 2-3 points in the total score of the Quality of Life Index has been associated with significant improvement in overall quality of life, self-image, physical symptoms, and general health in studies assessing change in quality of life.  PHQ-9: Review Flowsheet       09/05/2023 03/27/2023  Depression screen PHQ 2/9  Decreased Interest 0 0  Down, Depressed, Hopeless 0 0  PHQ - 2 Score 0 0  Altered sleeping 1 1  Tired, decreased energy 0 1  Change in appetite 0 0  Feeling bad or failure about yourself  0 0  Trouble concentrating 0 0  Moving slowly or fidgety/restless 0 0  Suicidal thoughts 0 0  PHQ-9 Score 1 2  Difficult doing work/chores Not difficult at all Not difficult at all   Interpretation of Total Score  Total  Score Depression Severity:  1-4 = Minimal depression, 5-9 = Mild depression, 10-14 = Moderate depression, 15-19 = Moderately severe depression, 20-27 = Severe depression   Psychosocial Evaluation and Intervention:  Psychosocial Evaluation - 03/16/23 1445       Psychosocial Evaluation & Interventions   Interventions Encouraged to exercise with the program and follow exercise prescription    Comments Ed is coming to pulmonary rehab with dyspnea. His main concern is his stamina. He notes that he is unable to walk as far as he once could. He has lost 60 lbs since January 24, with mainly managing diet and potions and with some help from ozempic but had to stop because of GI issues. His wife has her masters in nutrition so she and he have been working on his diet for a while. When asked about stress, he states he doesn't feel stressed but feels very blessed to have his life. He and his wife are going on another cruise next week which he enjoys and looking forward to. He wants to attend the program to work on his stamina and gain some muscle that he has lost.    Expected  Outcomes Short: attend pulmonary rehab for education and exercise. Long; develop and maintain positive self care habits    Continue Psychosocial Services  Follow up required by staff             Psychosocial Re-Evaluation:  Psychosocial Re-Evaluation     Row Name 04/20/23 0930 05/09/23 0924 07/06/23 0954 08/03/23 0949       Psychosocial Re-Evaluation   Current issues with None Identified None Identified None Identified None Identified    Comments Patient reports no issues with their current mental states, sleep, stress, depression or anxiety. Will follow up with patient in a few weeks for any changes. Patient reports no issues with their current mental states, sleep, stress, depression or anxiety. Will follow up with patient in a few weeks for any changes. Ed denies any anxiety, depression, stress currently. Says he has a good  support systemt with family. He reports sleeping very well, thought last night was not a good night of sleep. Ed doesnt report having any stress, or sleep concerns at this time. He has a good support system thats made up of his wife, children and grandchildren.    Expected Outcomes Short: Continue to exercise regularly to support mental health and notify staff of any changes. Long: maintain mental health and well being through teaching of rehab or prescribed medications independently. Short: Continue to exercise regularly to support mental health and notify staff of any changes. Long: maintain mental health and well being through teaching of rehab or prescribed medications independently. STG: Continue to attend pulmonary rehab. LTG: Maintain mental health and well being through teaching of rehab or prescribed medications independently. Short: Continue to attend rehab. Long: Continue to maintain a positive mental health with exercise, and healthy outlets.    Interventions Encouraged to attend Pulmonary Rehabilitation for the exercise Encouraged to attend Pulmonary Rehabilitation for the exercise Encouraged to attend Pulmonary Rehabilitation for the exercise Encouraged to attend Pulmonary Rehabilitation for the exercise    Continue Psychosocial Services  Follow up required by staff Follow up required by staff Follow up required by staff Follow up required by staff             Psychosocial Discharge (Final Psychosocial Re-Evaluation):  Psychosocial Re-Evaluation - 08/03/23 0949       Psychosocial Re-Evaluation   Current issues with None Identified    Comments Ed doesnt report having any stress, or sleep concerns at this time. He has a good support system thats made up of his wife, children and grandchildren.    Expected Outcomes Short: Continue to attend rehab. Long: Continue to maintain a positive mental health with exercise, and healthy outlets.    Interventions Encouraged to attend Pulmonary  Rehabilitation for the exercise    Continue Psychosocial Services  Follow up required by staff             Education: Education Goals: Education classes will be provided on a weekly basis, covering required topics. Participant will state understanding/return demonstration of topics presented.  Learning Barriers/Preferences:  Learning Barriers/Preferences - 03/16/23 1443       Learning Barriers/Preferences   Learning Barriers None    Learning Preferences None             General Pulmonary Education Topics:  Infection Prevention: - Provides verbal and written material to individual with discussion of infection control including proper hand washing and proper equipment cleaning during exercise session. Flowsheet Row Pulmonary Rehab from 05/11/2023 in [facility] Cardiac and Pulmonary Rehab  Date  03/27/23  Educator MB  Instruction Review Code 1- Verbalizes Understanding       Falls Prevention: - Provides verbal and written material to individual with discussion of falls prevention and safety. Flowsheet Row Pulmonary Rehab from [facility]/11/2023 in [facility] Cardiac and Pulmonary Rehab  Date 03/27/23  Educator MB  Instruction Review Code 1- Verbalizes Understanding       Chronic Lung Disease Review: - Group verbal instruction with posters, models, PowerPoint presentations and videos,  to review new updates, new respiratory medications, new advancements in procedures and treatments. Providing information on websites and "800" numbers for continued self-education. Includes information about supplement oxygen, available portable oxygen systems, continuous and intermittent flow rates, oxygen safety, concentrators, and Medicare reimbursement for oxygen. Explanation of Pulmonary Drugs, including class, frequency, complications, importance of spacers, rinsing mouth after steroid MDI's, and proper cleaning methods for nebulizers. Review of basic lung anatomy and physiology related to function,  structure, and complications of lung disease. Review of risk factors. Discussion about methods for diagnosing sleep apnea and types of masks and machines for OSA. Includes a review of the use of types of environmental controls: home humidity, furnaces, filters, dust mite/pet prevention, HEPA vacuums. Discussion about weather changes, air quality and the benefits of nasal washing. Instruction on Warning signs, infection symptoms, calling MD promptly, preventive modes, and value of vaccinations. Review of effective airway clearance, coughing and/or vibration techniques. Emphasizing that all should Create an Action Plan. Written material given at graduation. Flowsheet Row Pulmonary Rehab from 05/11/2023 in [facility] Cardiac and Pulmonary Rehab  Education need identified 03/27/23       AED/CPR: - Group verbal and written instruction with the use of models to demonstrate the basic use of the AED with the basic ABC's of resuscitation.    Anatomy and Cardiac Procedures: - Group verbal and visual presentation and models provide information about basic cardiac anatomy and function. Reviews the testing methods done to diagnose heart disease and the outcomes of the test results. Describes the treatment choices: Medical Management, Angioplasty, or Coronary Bypass Surgery for treating various heart conditions including Myocardial Infarction, Angina, Valve Disease, and Cardiac Arrhythmias.  Written material given at graduation. Flowsheet Row Pulmonary Rehab from 05/11/2023 in [facility] Cardiac and Pulmonary Rehab  Date 05/11/23  Educator SB  Instruction Review Code 1- Verbalizes Understanding       Medication Safety: - Group verbal and visual instruction to review commonly prescribed medications for heart and lung disease. Reviews the medication, class of the drug, and side effects. Includes the steps to properly store meds and maintain the prescription regimen.  Written material given at  graduation.   Other: -Provides group and verbal instruction on various topics (see comments)   Knowledge Questionnaire Score:  Knowledge Questionnaire Score - 09/05/23 1038       Knowledge Questionnaire Score   Pre Score 17/18    Post Score 17/18              Core Components/Risk Factors/Patient Goals at Admission:  Personal Goals and Risk Factors at Admission - 03/27/23 1653       Core Components/Risk Factors/Patient Goals on Admission    Weight Management Yes;Weight Maintenance;Weight Loss   has lost 60 lbs since January of 24- looking to gain muscle   Intervention Weight Management: Develop a combined nutrition and exercise program designed to reach desired caloric intake, while maintaining appropriate intake of nutrient and fiber, sodium and fats, and appropriate energy expenditure required for the weight goal.;Weight Management: Provide education  and appropriate resources to help participant work on and attain dietary goals.;Weight Management/Obesity: Establish reasonable short term and long term weight goals.    Admit Weight 270 lb 11.2 oz (122.8 kg)    Goal Weight: Short Term 270 lb (122.5 kg)    Goal Weight: Long Term 270 lb (122.5 kg)    Expected Outcomes Long Term: Adherence to nutrition and physical activity/exercise program aimed toward attainment of established weight goal;Short Term: Continue to assess and modify interventions until short term weight is achieved;Understanding recommendations for meals to include 15-35% energy as protein, 25-35% energy from fat, 35-60% energy from carbohydrates, less than 200mg  of dietary cholesterol, 20-35 gm of total fiber daily;Understanding of distribution of calorie intake throughout the day with the consumption of 4-5 meals/snacks;Weight Maintenance: Understanding of the daily nutrition guidelines, which includes 25-35% calories from fat, 7% or less cal from saturated fats, less than 200mg  cholesterol, less than 1.5gm of sodium, & 5  or more servings of fruits and vegetables daily    Diabetes Yes    Intervention Provide education about proper nutrition, including hydration, and aerobic/resistive exercise prescription along with prescribed medications to achieve blood glucose in normal ranges: Fasting glucose 65-99 mg/dL;Provide education about signs/symptoms and action to take for hypo/hyperglycemia.    Expected Outcomes Short Term: Participant verbalizes understanding of the signs/symptoms and immediate care of hyper/hypoglycemia, proper foot care and importance of medication, aerobic/resistive exercise and nutrition plan for blood glucose control.;Long Term: Attainment of HbA1C < 7%.    Hypertension Yes    Intervention Provide education on lifestyle modifcations including regular physical activity/exercise, weight management, moderate sodium restriction and increased consumption of fresh fruit, vegetables, and low fat dairy, alcohol moderation, and smoking cessation.;Monitor prescription use compliance.    Expected Outcomes Short Term: Continued assessment and intervention until BP is < 140/4mm HG in hypertensive participants. < 130/60mm HG in hypertensive participants with diabetes, heart failure or chronic kidney disease.;Long Term: Maintenance of blood pressure at goal levels.             Education:Diabetes - Individual verbal and written instruction to review signs/symptoms of diabetes, desired ranges of glucose level fasting, after meals and with exercise. Acknowledge that pre and post exercise glucose checks will be done for 3 sessions at entry of program. Flowsheet Row Pulmonary Rehab from 05/11/2023 in [facility] Cardiac and Pulmonary Rehab  Date 03/27/23  Educator MB  Instruction Review Code 1- Verbalizes Understanding       Know Your Numbers and Heart Failure: - Group verbal and visual instruction to discuss disease risk factors for cardiac and pulmonary disease and treatment options.  Reviews associated critical  values for Overweight/Obesity, Hypertension, Cholesterol, and Diabetes.  Discusses basics of heart failure: signs/symptoms and treatments.  Introduces Heart Failure Zone chart for action plan for heart failure.  Written material given at graduation.   Core Components/Risk Factors/Patient Goals Review:   Goals and Risk Factor Review     Row Name 04/20/23 0926 05/09/23 0930 07/06/23 0957 08/03/23 0956       Core Components/Risk Factors/Patient Goals Review   Personal Goals Review Improve shortness of breath with ADL's Weight Management/Obesity Weight Management/Obesity Weight Management/Obesity    Review Spoke to patient about their shortness of breath and what they can do to improve. Patient has been informed of breathing techniques when starting the program. Patient is informed to tell staff if they have had any med changes and that certain meds they are taking or not taking can be causing  shortness of breath. Ed states that he is more interested in mainting his weight rather than losing weight. He said that he took ozempic over the summer when he weighed 330lb, but knows he feels comfotable at his current weight of around 260-270lbs. Ed is doing well with his weight, reports he is down ~60lbs in ~82months and is happy to have been able to maintain it. Ed is happy with his weight, and would like to maintain.    Expected Outcomes Short: Attend [facility] regularly to improve shortness of breath with ADL's. Long: maintain independence with ADL's Short: Continue to manage diet and exercise in order to maintain weight. Long: Attend rehab in order to manage weight. STG: Continue to follow good eating habits that allowed him to lose weight. LTG: achieve and maintain and healthy weight Short: Continue to exercise and diet to maintain weight. Long: Continue to maintain weight.             Core Components/Risk Factors/Patient Goals at Discharge (Final Review):   Goals and Risk Factor Review - 08/03/23 0956        Core Components/Risk Factors/Patient Goals Review   Personal Goals Review Weight Management/Obesity    Review Ed is happy with his weight, and would like to maintain.    Expected Outcomes Short: Continue to exercise and diet to maintain weight. Long: Continue to maintain weight.             ITP Comments:  ITP Comments     Row Name 03/16/23 1452 03/27/23 1639 03/29/23 0936 04/04/23 0937 04/26/23 1243   ITP Comments Initial phone call completed. Diagnosis can be found in [facility] 11/14. EP Orientation scheduled for Monday 12/16 at 2:30. Completed and gym orientation. Initial ITP created and sent for review to Dr. Faud Aleskerov, Medical Director. 30 Day review completed. Medical Director ITP review done, changes made as directed, and signed approval by Medical Director.    new to program First full day of exercise!  Patient was oriented to gym and equipment including functions, settings, policies, and procedures.  Patient's individual exercise prescription and treatment plan were reviewed.  All starting workloads were established based on the results of the 6 minute walk test done at initial orientation visit.  The plan for exercise progression was also introduced and progression will be customized based on patient's performance and goals. 30 Day review completed. Medical Director ITP review done, changes made as directed, and signed approval by Medical Director.   new to program    Row Name 05/24/23 0723 06/21/23 0747 07/19/23 0731 08/16/23 0824 09/07/23 0954   ITP Comments 30 Day review completed. Medical Director ITP review done, changes made as directed, and signed approval by Medical Director. 30 Day review completed. Medical Director ITP review done, changes made as directed, and signed approval by Medical Director. 30 Day review completed. Medical Director ITP review done, changes made as directed, and signed approval by Medical Director. 30 Day review completed. Medical Director ITP  review done, changes made as directed, and signed approval by Medical Director. Kavish graduated today from  rehab with 36 sessions completed.  Details of the patient's exercise prescription and what He needs to do in order to continue the prescription and progress were discussed with patient.  Patient was given a copy of prescription and goals.  Patient verbalized understanding. Kaymon plans to continue to exercise by joining a gym.            Comments: Discharge ITP

## 2023-09-07 NOTE — Progress Notes (Signed)
 Daily Session Note  Patient Details  Name: HYDER DEMAN MRN: [id number] Date of Birth: 10-31-1940 Referring Provider:   Flowsheet Row Pulmonary Rehab from 03/27/2023 in [facility] Cardiac and Pulmonary Rehab  Referring Provider Erskin Hearing, MD       Encounter Date: 09/07/2023  Check In:  Session Check In - 09/07/23 0953       Check-In   Supervising physician immediately available to respond to emergencies See telemetry face sheet for immediately available ER MD    Location [facility]-Cardiac & Pulmonary Rehab    Staff Present Maud Sorenson, RN, BSN, CCRP;Joseph Hood RCP,RRT,BSRT;Margaret Best, MS, Exercise Physiologist;Maxon Conetta BS, Exercise Physiologist    Virtual Visit No    Medication changes reported     No    Fall or balance concerns reported    No    Warm-up and Cool-down Performed on first and last piece of equipment    Resistance Training Performed Yes    VAD Patient? No    PAD/SET Patient? No      Pain Assessment   Currently in Pain? No/denies                Social History   Tobacco Use  Smoking Status Former   Current packs/day: 0.00   Average packs/day: 2.0 packs/day for 40.0 years (80.0 ttl pk-yrs)   Types: Cigarettes   Start date: 10/09/1961   Quit date: 10/09/2001   Years since quitting: 21.9  Smokeless Tobacco Never    Goals Met:  Proper associated with RPD/PD & O2 Sat Independence with exercise equipment Exercise tolerated well Personal goals reviewed No report of concerns or symptoms today  Goals Unmet:  Not Applicable  Comments: Pt able to follow exercise prescription today without complaint.    Arth graduated today from  rehab with 36 sessions completed.  Details of the patient's exercise prescription and what He needs to do in order to continue the prescription and progress were discussed with patient.  Patient was given a copy of prescription and goals.  Patient verbalized understanding. Khori plans to continue to exercise by joining a  gym.    Dr. Firman Hughes is Medical Director for [facility] Cardiac Rehabilitation.  Dr. Fuad Aleskerov is Medical Director for [facility] Pulmonary Rehabilitation.

## 2023-10-04 ENCOUNTER — Ambulatory Visit: Payer: Self-pay | Admitting: Urology

## 2023-10-05 ENCOUNTER — Other Ambulatory Visit: Payer: Self-pay | Admitting: Urology

## 2023-10-06 ENCOUNTER — Telehealth: Payer: Self-pay | Admitting: Urology

## 2023-10-06 NOTE — Telephone Encounter (Signed)
 Patient no-showed for his 10/04/2023 appointment.  Received a refill request for dutasteride .  I refilled for 1 month.  He needs a follow-up visit prior to additional refills.  Okay to schedule with PA

## 2023-11-09 ENCOUNTER — Encounter: Payer: Self-pay | Admitting: Urology

## 2023-11-09 ENCOUNTER — Ambulatory Visit: Admitting: Urology

## 2023-11-09 VITALS — BP 116/74 | HR 87 | Ht 69.0 in | Wt 272.0 lb

## 2023-11-09 DIAGNOSIS — Z87898 Personal history of other specified conditions: Secondary | ICD-10-CM

## 2023-11-09 DIAGNOSIS — N400 Enlarged prostate without lower urinary tract symptoms: Secondary | ICD-10-CM | POA: Diagnosis not present

## 2023-11-09 LAB — MICROSCOPIC EXAMINATION

## 2023-11-09 LAB — URINALYSIS, COMPLETE
Bilirubin, UA: NEGATIVE
Ketones, UA: NEGATIVE
Leukocytes,UA: NEGATIVE
Nitrite, UA: NEGATIVE
Protein,UA: NEGATIVE
Specific Gravity, UA: <1.005 — ABNORMAL LOW (ref 1.005–1.030)
Urobilinogen, Ur: 1 mg/dL (ref 0.2–1.0)
pH, UA: 6 (ref 5.0–7.5)

## 2023-11-09 MED ORDER — DUTASTERIDE 0.5 MG PO CAPS: 0.5000 mg | ORAL_CAPSULE | Freq: Every day | ORAL | 3 refills | Status: AC

## 2023-11-09 NOTE — Progress Notes (Signed)
 11/09/2023 10:13 AM   Zachary FORBES Das 14-Jul-1940 [id number]  Referring provider: Alla Amis, MD [phone] [facility] [address] [facility] [place],  [place] [address]  Chief Complaint  Patient presents with   Hematuria   Urologic history: 1.  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  No bladder mucosal lesions Dutasteride  started 01/2023 Urine cytology negative   2.  BPH without LUTS    HPI: Walter Knox is a 83 y.o. male presents for 42-month follow-up.  No complaints since last visit Denies recurrent hematuria He has no flank, abdominal, pelvic pain or irritative voiding symptoms.   PMH: Past Medical History:  Diagnosis Date   Anginal pain (HCC)    BPH (benign prostatic hyperplasia)    CAD (coronary artery disease)    Cancer (HCC)    Chronic back pain    Dyspnea    Dysrhythmia    Environmental and seasonal allergies    Hyperlipidemia    Hypertension    Lower extremity edema    Morbid obesity (HCC)    Pre-diabetes    PVD (peripheral vascular disease) (HCC)    SSS (sick sinus syndrome) (HCC)     Surgical History: Past Surgical History:  Procedure Laterality Date   CARDIOVERSION N/A 06/02/2022   Procedure: CARDIOVERSION;  Surgeon: Ammon Blunt, MD;  Location: [facility] ORS;  Service: Cardiovascular;  Laterality: N/A;   COLONOSCOPY     COLONOSCOPY WITH ESOPHAGOGASTRODUODENOSCOPY (EGD)     COLONOSCOPY WITH PROPOFOL  N/A 03/31/2017   Procedure: COLONOSCOPY WITH PROPOFOL ;  Surgeon: Gaylyn Gladis PENNER, MD;  Location: [facility] ENDOSCOPY;  Service: Endoscopy;  Laterality: N/A;   COLONOSCOPY WITH PROPOFOL  N/A 08/09/2017   Procedure: COLONOSCOPY WITH PROPOFOL ;  Surgeon: Toledo, Ladell POUR, MD;  Location: [facility] ENDOSCOPY;  Service: Gastroenterology;  Laterality: N/A;    CORONARY ANGIOPLASTY WITH STENT PLACEMENT     JOINT REPLACEMENT Bilateral 2006 2011   KNEE SURGERY     PACEMAKER INSERTION N/A 05/03/2017   Procedure: INSERTION PACEMAKER-DUAL CHAMBER INITIAL INSERT;  Surgeon: Ammon Blunt, MD;  Location: [facility] ORS;  Service: Cardiovascular;  Laterality: N/A;   Pilondial Cyst Removal     PPM GENERATOR CHANGEOUT N/A 07/11/2023   Procedure: PPM GENERATOR CHANGEOUT;  Surgeon: Ammon Blunt, MD;  Location: [facility] INVASIVE CV LAB;  Service: Cardiovascular;  Laterality: N/A;   TONSILLECTOMY     Total Right Knee Replacement      Home Medications:  Allergies as of 11/09/2023   No Known Allergies      Medication List        Accurate as of November 09, 2023 10:13 AM. If you have any questions, ask your nurse or doctor.          acetaminophen  500 MG tablet Commonly known as: TYLENOL  Take 1,000 mg by mouth 2 (two) times daily.   amiodarone  200 MG tablet Commonly known as: PACERONE  Take 200 mg by mouth in the morning.   cephALEXin  500 MG capsule Commonly known as: KEFLEX  Take 1 capsule (500 mg total) by mouth 2 (two) times daily.   docusate sodium 100 MG capsule Commonly known as: COLACE Take 100 mg by mouth daily.   dutasteride  0.5 MG capsule Commonly known as: AVODART  TAKE 1 CAPSULE BY MOUTH ONCE DAILY   Eliquis  5 MG Tabs tablet Generic drug: apixaban  Take 5 mg by mouth 2 (  two) times daily.   Entresto 24-26 MG Generic drug: sacubitril-valsartan Take 1 tablet by mouth 2 (two) times daily.   ezetimibe  10 MG tablet Commonly known as: ZETIA  Take 10 mg by mouth at bedtime.   Farxiga 10 MG Tabs tablet Generic drug: dapagliflozin propanediol Take 10 mg by mouth daily.   Loratadine 10 MG Caps Take 10 mg by mouth daily.   losartan -hydrochlorothiazide  50-12.5 MG tablet Commonly known as: HYZAAR Take 1 tablet by mouth daily.   lovastatin  20 MG 24 hr tablet Commonly known as: ALTOPREV  Take 20 mg by mouth at bedtime.   metFORMIN  500 MG 24 hr tablet Commonly known as: GLUCOPHAGE-XR Take 500 mg by mouth daily with supper.   metoprolol  tartrate 100 MG tablet Commonly known as: LOPRESSOR  Take 100 mg by mouth 2 (two) times daily.   multivitamin tablet Take 1 tablet by mouth daily.   omeprazole 20 MG capsule Commonly known as: PRILOSEC Take 20 mg by mouth at bedtime.   PHENYLEPHRINE -GUAIFENESIN PO Take 400 mg by mouth daily as needed (Congestion). Mucinex   polycarbophil 625 MG tablet Commonly known as: FIBERCON Take 1,875 mg by mouth 2 (two) times daily.   torsemide 20 MG tablet Commonly known as: DEMADEX Take 20 mg by mouth daily.        Allergies: No Known Allergies   Social History:  reports that he quit smoking about 22 years ago. His smoking use included cigarettes. He started smoking about 62 years ago. He has a 80 pack-year smoking history. He has never used smokeless tobacco. He reports current alcohol use of about 3.0 standard drinks of alcohol per week. He reports that he does not use drugs.   Physical Exam: BP 116/74   Pulse 87   Ht 5' 9 (1.753 m)   Wt 272 lb (123.4 kg)   BMI 40.17 kg/m   Constitutional:  Alert, No acute distress. HEENT: [place] AT Respiratory: Normal respiratory effort, no increased work of breathing. Psychiatric: Normal mood and affect.   Assessment & Plan:    1.  History of gross hematuria No recurrent episodes 1 year follow-up Dutasteride  refilled  2.  BPH without LUTS   [name], MD  [facility] [address], [address] [place], [place] [address] [phone]

## 2024-01-30 ENCOUNTER — Other Ambulatory Visit: Payer: Self-pay | Admitting: Medical Genetics

## 2024-01-30 DIAGNOSIS — Z006 Encounter for examination for normal comparison and control in clinical research program: Secondary | ICD-10-CM

## 2024-03-12 ENCOUNTER — Other Ambulatory Visit: Payer: Self-pay | Admitting: Nephrology

## 2024-03-12 DIAGNOSIS — N1832 Chronic kidney disease, stage 3b: Secondary | ICD-10-CM

## 2024-03-12 DIAGNOSIS — I129 Hypertensive chronic kidney disease with stage 1 through stage 4 chronic kidney disease, or unspecified chronic kidney disease: Secondary | ICD-10-CM

## 2024-03-18 ENCOUNTER — Ambulatory Visit: Admission: RE | Admit: 2024-03-18 | Discharge: 2024-03-18 | Attending: Nephrology | Admitting: Nephrology

## 2024-03-18 DIAGNOSIS — N1832 Chronic kidney disease, stage 3b: Secondary | ICD-10-CM

## 2024-03-18 DIAGNOSIS — I129 Hypertensive chronic kidney disease with stage 1 through stage 4 chronic kidney disease, or unspecified chronic kidney disease: Secondary | ICD-10-CM

## 2024-11-08 ENCOUNTER — Ambulatory Visit: Admitting: Urology
